# Patient Record
Sex: Male | Born: 1935 | Race: White | Hispanic: No | Marital: Married | Smoking: Former smoker
Health system: Southern US, Community
[De-identification: ages and names within clinical notes are randomized; demographics above are authoritative.]

## PROBLEM LIST (undated history)

## (undated) DIAGNOSIS — C4490 Unspecified malignant neoplasm of skin, unspecified: Secondary | ICD-10-CM

## (undated) DIAGNOSIS — A0472 Enterocolitis due to Clostridium difficile, not specified as recurrent: Secondary | ICD-10-CM

## (undated) DIAGNOSIS — B029 Zoster without complications: Secondary | ICD-10-CM

## (undated) DIAGNOSIS — I2510 Atherosclerotic heart disease of native coronary artery without angina pectoris: Secondary | ICD-10-CM

## (undated) DIAGNOSIS — B1920 Unspecified viral hepatitis C without hepatic coma: Secondary | ICD-10-CM

## (undated) DIAGNOSIS — I77810 Thoracic aortic ectasia: Secondary | ICD-10-CM

## (undated) DIAGNOSIS — I4892 Unspecified atrial flutter: Principal | ICD-10-CM

## (undated) DIAGNOSIS — C220 Liver cell carcinoma: Secondary | ICD-10-CM

## (undated) DIAGNOSIS — K7460 Unspecified cirrhosis of liver: Secondary | ICD-10-CM

## (undated) DIAGNOSIS — R0600 Dyspnea, unspecified: Secondary | ICD-10-CM

## (undated) DIAGNOSIS — N200 Calculus of kidney: Secondary | ICD-10-CM

## (undated) DIAGNOSIS — K219 Gastro-esophageal reflux disease without esophagitis: Secondary | ICD-10-CM

## (undated) DIAGNOSIS — Z87891 Personal history of nicotine dependence: Secondary | ICD-10-CM

## (undated) HISTORY — DX: Calculus of kidney: N20.0

## (undated) HISTORY — DX: Unspecified viral hepatitis C without hepatic coma: B19.20

## (undated) HISTORY — DX: Thoracic aortic ectasia: I77.810

## (undated) HISTORY — DX: Zoster without complications: B02.9

## (undated) HISTORY — DX: Unspecified cirrhosis of liver: K74.60

## (undated) HISTORY — PX: LITHOTRIPSY: SUR834

## (undated) HISTORY — DX: Atherosclerotic heart disease of native coronary artery without angina pectoris: I25.10

## (undated) HISTORY — PX: SKIN CANCER EXCISION: SHX779

## (undated) HISTORY — DX: Unspecified malignant neoplasm of skin, unspecified: C44.90

---

## 1999-05-01 ENCOUNTER — Encounter: Payer: Self-pay | Admitting: Emergency Medicine

## 1999-05-01 ENCOUNTER — Emergency Department (HOSPITAL_COMMUNITY): Admission: EM | Admit: 1999-05-01 | Discharge: 1999-05-01 | Payer: Self-pay | Admitting: *Deleted

## 2001-07-05 ENCOUNTER — Encounter: Admission: RE | Admit: 2001-07-05 | Discharge: 2001-07-05 | Payer: Self-pay | Admitting: Gastroenterology

## 2001-07-05 ENCOUNTER — Encounter: Payer: Self-pay | Admitting: Gastroenterology

## 2002-04-30 ENCOUNTER — Encounter: Payer: Self-pay | Admitting: Urology

## 2002-04-30 ENCOUNTER — Encounter: Admission: RE | Admit: 2002-04-30 | Discharge: 2002-04-30 | Payer: Self-pay | Admitting: Urology

## 2002-05-02 ENCOUNTER — Ambulatory Visit (HOSPITAL_BASED_OUTPATIENT_CLINIC_OR_DEPARTMENT_OTHER): Admission: RE | Admit: 2002-05-02 | Discharge: 2002-05-02 | Payer: Self-pay | Admitting: Urology

## 2002-05-02 ENCOUNTER — Encounter: Payer: Self-pay | Admitting: Urology

## 2004-02-24 ENCOUNTER — Ambulatory Visit (HOSPITAL_COMMUNITY): Admission: RE | Admit: 2004-02-24 | Discharge: 2004-02-24 | Payer: Self-pay | Admitting: Gastroenterology

## 2007-07-05 ENCOUNTER — Ambulatory Visit (HOSPITAL_COMMUNITY): Admission: RE | Admit: 2007-07-05 | Discharge: 2007-07-05 | Payer: Self-pay | Admitting: Urology

## 2007-07-07 ENCOUNTER — Emergency Department (HOSPITAL_COMMUNITY): Admission: EM | Admit: 2007-07-07 | Discharge: 2007-07-07 | Payer: Self-pay | Admitting: Emergency Medicine

## 2010-08-15 HISTORY — PX: ABLATION: SHX5711

## 2010-11-29 ENCOUNTER — Ambulatory Visit: Payer: Self-pay | Admitting: Internal Medicine

## 2010-12-22 ENCOUNTER — Encounter: Payer: Self-pay | Admitting: Internal Medicine

## 2010-12-23 ENCOUNTER — Ambulatory Visit (INDEPENDENT_AMBULATORY_CARE_PROVIDER_SITE_OTHER): Payer: Medicare Other | Admitting: Internal Medicine

## 2010-12-23 ENCOUNTER — Encounter: Payer: Self-pay | Admitting: Internal Medicine

## 2010-12-23 VITALS — BP 140/84 | HR 72 | Resp 18 | Ht 68.0 in | Wt 178.4 lb

## 2010-12-23 DIAGNOSIS — B1920 Unspecified viral hepatitis C without hepatic coma: Secondary | ICD-10-CM

## 2010-12-23 DIAGNOSIS — R002 Palpitations: Secondary | ICD-10-CM

## 2010-12-23 DIAGNOSIS — I499 Cardiac arrhythmia, unspecified: Secondary | ICD-10-CM

## 2010-12-23 NOTE — Assessment & Plan Note (Signed)
Patient is an 77 year old referred for evaluation of atrial fibrillaiton.  Only episode in question occurred duing a procedure.  EKGs after and now show SR with PACs, PVCs. THe patient is asymptomatic. Exam without evid volume overload. EKG with PACs I would recommend that the patient wear a 48 hr holter monitor to document ectopy. I would not do any thing different now. If indeed he has atrial fib wiil need to discuss with GI his risks with aspirin and coumadin.

## 2010-12-23 NOTE — Progress Notes (Signed)
HPI Patient is an 77 year old with a history of hepatitis C.  He has no known cardiac problems He was recently undergoing a bx of a liver lesion and during the procedure was reported to have atrial fibrillation. EKG after the procedure showed SR with PACs and PVCs. The patient denies palpitations.  No CP.  He is very busy.  Farms.  Denies dizziness.  NO SOB.  Allergies  Allergen Reactions  . Penicillins     Current Outpatient Prescriptions  Medication Sig Dispense Refill  . cholestyramine (QUESTRAN) 4 G packet Take 1 packet by mouth daily.        . hydrOXYzine (ATARAX) 25 MG tablet Take 25 mg by mouth 3 (three) times daily as needed.        . Multiple Vitamin (MULTIVITAMIN) capsule Take 1 capsule by mouth. Every other week.      Marland Kitchen DISCONTD: Naproxen Sodium (ALEVE) 220 MG CAPS Take by mouth as needed.          Past Medical History  Diagnosis Date  . Hepatitis C   . Liver cirrhosis   . Shingles   . Skin cancer   . Nephrolithiasis   . Kidney stones     Past Surgical History  Procedure Date  . Lithotripsy     Family History  Problem Relation Age of Onset  . Leukemia Father     History   Social History  . Marital Status: Married    Spouse Name: N/A    Number of Children: N/A  . Years of Education: N/A   Occupational History  . Not on file.   Social History Main Topics  . Smoking status: Former Smoker    Types: Cigarettes    Quit date: 08/15/1970  . Smokeless tobacco: Not on file  . Alcohol Use: Yes  . Drug Use: No  . Sexually Active: Not on file   Other Topics Concern  . Not on file   Social History Narrative  . No narrative on file    Review of Systems:  All systems reviewed.  They are negative to the above problem except as previously stated.  Vital Signs: BP 140/84  Pulse 72  Resp 18  Ht 5\' 8"  (1.727 m)  Wt 178 lb 6.4 oz (80.922 kg)  BMI 27.13 kg/m2  Physical Exam  HEENT:  Normocephalic, atraumatic. EOMI, PERRLA.  Neck: JVP is normal. No  thyromegaly. No bruits.  Lungs: clear to auscultation. No rales no wheezes.  Heart: Regular rate and rhythm. Normal S1, S2. No S3.   No significant murmurs. PMI not displaced.  Abdomen:  Supple, nontender. Normal bowel sounds. No masses. No hepatomegaly.  Extremities:   Good distal pulses throughout. No lower extremity edema.  Musculoskeletal :moving all extremities.  Neuro:   alert and oriented x3.  CN II-XII grossly intact.  EKG:  NSR.  72 bpm.  PACs.   Assessment and Plan:

## 2010-12-23 NOTE — Patient Instructions (Signed)
Your physician has recommended that you wear a holter monitor. Holter monitors are medical devices that record the heart's electrical activity. Doctors most often use these monitors to diagnose arrhythmias. Arrhythmias are problems with the speed or rhythm of the heartbeat. The monitor is a small, portable device. You can wear one while you do your normal daily activities. This is usually used to diagnose what is causing palpitations/syncope (passing out). We will call you with results.

## 2010-12-31 ENCOUNTER — Encounter (INDEPENDENT_AMBULATORY_CARE_PROVIDER_SITE_OTHER): Payer: Medicare Other

## 2010-12-31 DIAGNOSIS — R002 Palpitations: Secondary | ICD-10-CM

## 2010-12-31 NOTE — Op Note (Signed)
NAME:  Lee Allen, Lee Allen NO.:  [id number]   MEDICAL RECORD NO.:  [id number]                   PATIENT TYPE:  AMB   LOCATION:  ENDO                                 FACILITY:  [facility]   PHYSICIAN:  John C. Madilyn Fireman, M.D.                 DATE OF BIRTH:  July 23, 1925   DATE OF PROCEDURE:  02/24/2004  DATE OF DISCHARGE:                                 OPERATIVE REPORT   PROCEDURE:  Colonoscopy.   INDICATIONS FOR PROCEDURE:  Average risk colon cancer screening in a 77-year-  old patient with no prior screening.   DESCRIPTION OF PROCEDURE:  The patient was placed in the left lateral  decubitus position then placed on the pulse monitor with continuous low flow  oxygen delivered by nasal cannula. He was sedated with 75 mcg IV fentanyl  and 7 mg IV Versed. The Olympus video colonoscope was inserted into the  rectum and advanced its entire length but despite multiple position changes,  abdominal pressure and torquing maneuvers I could not reach the cecum  despite the scope being fully inserted. I was able to see the ileocecal  valve from a distance but the proximal ascending colon and cecum were not  adequately inspected.  The mid to distal ascending colon appeared normal  with no masses, polyps or diverticula as did the transverse and descending  colon.  There were a few scattered sigmoid diverticula and no other  abnormalities. The rectum appeared normal and retroflexed view of the anus  revealed no obvious internal hemorrhoids.  The scope was then withdrawn and  the patient returned to the recovery room in stable condition. The patient  tolerated the procedure well and there were no immediate complications.   IMPRESSION:  Diverticulosis otherwise normal study with inadequate view of  the proximal ascending colon, unable to directly visualize the cecum.   PLAN:  Will followup this study with a barium enema.                                               John C.  Madilyn Fireman, M.D.    [facility]/MEDQ  D:  02/24/2004  T:  02/24/2004  Job:  [id number]   cc:   Lianne Bushy, M.D.  [address]  [place]  [place] [address]  Fax: [phone]

## 2011-01-04 ENCOUNTER — Telehealth: Payer: Self-pay | Admitting: *Deleted

## 2011-01-04 NOTE — Telephone Encounter (Signed)
Called patient with results of 48 hour Holter Monitor. Advised will follow up only prn.

## 2011-05-24 LAB — B-NATRIURETIC PEPTIDE (CONVERTED LAB): Pro B Natriuretic peptide (BNP): 41.1

## 2011-05-24 LAB — CBC
HCT: 44.3
Hemoglobin: 15.6
RBC: 4.52

## 2011-05-24 LAB — COMPREHENSIVE METABOLIC PANEL
ALT: 55 — ABNORMAL HIGH
AST: 56 — ABNORMAL HIGH
Albumin: 3.8
CO2: 24
GFR calc Af Amer: 58 — ABNORMAL LOW
Glucose, Bld: 116 — ABNORMAL HIGH
Total Bilirubin: 1.6 — ABNORMAL HIGH
Total Protein: 7

## 2011-05-24 LAB — URINE MICROSCOPIC-ADD ON

## 2011-05-24 LAB — URINALYSIS, ROUTINE W REFLEX MICROSCOPIC
Glucose, UA: NEGATIVE
Ketones, ur: NEGATIVE
Nitrite: NEGATIVE
Protein, ur: NEGATIVE
Specific Gravity, Urine: 1.023
pH: 6

## 2011-05-24 LAB — POCT CARDIAC MARKERS
Operator id: 1211
Troponin i, poc: <0.05

## 2011-05-24 LAB — DIFFERENTIAL
Basophils Relative: 0
Eosinophils Relative: 0
Monocytes Absolute: 0.4
Monocytes Relative: 5
Neutro Abs: 6.8

## 2011-09-01 DIAGNOSIS — Z8505 Personal history of malignant neoplasm of liver: Secondary | ICD-10-CM | POA: Diagnosis not present

## 2011-09-01 DIAGNOSIS — I868 Varicose veins of other specified sites: Secondary | ICD-10-CM | POA: Diagnosis not present

## 2011-09-01 DIAGNOSIS — C228 Malignant neoplasm of liver, primary, unspecified as to type: Secondary | ICD-10-CM | POA: Diagnosis not present

## 2011-09-01 DIAGNOSIS — K7460 Unspecified cirrhosis of liver: Secondary | ICD-10-CM | POA: Diagnosis not present

## 2011-09-01 DIAGNOSIS — K862 Cyst of pancreas: Secondary | ICD-10-CM | POA: Diagnosis not present

## 2011-09-01 DIAGNOSIS — K766 Portal hypertension: Secondary | ICD-10-CM | POA: Diagnosis not present

## 2011-09-01 DIAGNOSIS — R161 Splenomegaly, not elsewhere classified: Secondary | ICD-10-CM | POA: Diagnosis not present

## 2011-09-05 DIAGNOSIS — I491 Atrial premature depolarization: Secondary | ICD-10-CM | POA: Diagnosis not present

## 2011-09-05 DIAGNOSIS — G458 Other transient cerebral ischemic attacks and related syndromes: Secondary | ICD-10-CM | POA: Diagnosis not present

## 2011-09-05 DIAGNOSIS — B182 Chronic viral hepatitis C: Secondary | ICD-10-CM | POA: Diagnosis not present

## 2011-09-06 DIAGNOSIS — G459 Transient cerebral ischemic attack, unspecified: Secondary | ICD-10-CM | POA: Diagnosis not present

## 2011-09-06 DIAGNOSIS — N400 Enlarged prostate without lower urinary tract symptoms: Secondary | ICD-10-CM | POA: Diagnosis not present

## 2011-09-06 DIAGNOSIS — Z23 Encounter for immunization: Secondary | ICD-10-CM | POA: Diagnosis not present

## 2011-09-06 DIAGNOSIS — E785 Hyperlipidemia, unspecified: Secondary | ICD-10-CM | POA: Diagnosis not present

## 2011-09-06 DIAGNOSIS — Z0000 Encounter for general adult medical examination without abnormal findings: Secondary | ICD-10-CM | POA: Diagnosis not present

## 2011-09-08 DIAGNOSIS — Z0000 Encounter for general adult medical examination without abnormal findings: Secondary | ICD-10-CM | POA: Diagnosis not present

## 2011-11-04 DIAGNOSIS — D235 Other benign neoplasm of skin of trunk: Secondary | ICD-10-CM | POA: Diagnosis not present

## 2011-11-04 DIAGNOSIS — B0229 Other postherpetic nervous system involvement: Secondary | ICD-10-CM | POA: Diagnosis not present

## 2011-11-04 DIAGNOSIS — Z85828 Personal history of other malignant neoplasm of skin: Secondary | ICD-10-CM | POA: Diagnosis not present

## 2011-12-13 DIAGNOSIS — B182 Chronic viral hepatitis C: Secondary | ICD-10-CM | POA: Diagnosis not present

## 2011-12-13 DIAGNOSIS — N400 Enlarged prostate without lower urinary tract symptoms: Secondary | ICD-10-CM | POA: Diagnosis not present

## 2011-12-13 DIAGNOSIS — E785 Hyperlipidemia, unspecified: Secondary | ICD-10-CM | POA: Diagnosis not present

## 2011-12-13 DIAGNOSIS — I491 Atrial premature depolarization: Secondary | ICD-10-CM | POA: Diagnosis not present

## 2011-12-21 DIAGNOSIS — L299 Pruritus, unspecified: Secondary | ICD-10-CM | POA: Diagnosis not present

## 2011-12-21 DIAGNOSIS — Z85828 Personal history of other malignant neoplasm of skin: Secondary | ICD-10-CM | POA: Diagnosis not present

## 2011-12-21 DIAGNOSIS — D235 Other benign neoplasm of skin of trunk: Secondary | ICD-10-CM | POA: Diagnosis not present

## 2012-01-03 DIAGNOSIS — H5200 Hypermetropia, unspecified eye: Secondary | ICD-10-CM | POA: Diagnosis not present

## 2012-01-03 DIAGNOSIS — H2589 Other age-related cataract: Secondary | ICD-10-CM | POA: Diagnosis not present

## 2012-04-26 DIAGNOSIS — L299 Pruritus, unspecified: Secondary | ICD-10-CM | POA: Diagnosis not present

## 2012-04-26 DIAGNOSIS — R5383 Other fatigue: Secondary | ICD-10-CM | POA: Diagnosis not present

## 2012-04-26 DIAGNOSIS — R5381 Other malaise: Secondary | ICD-10-CM | POA: Diagnosis not present

## 2012-04-26 DIAGNOSIS — E539 Vitamin B deficiency, unspecified: Secondary | ICD-10-CM | POA: Diagnosis not present

## 2012-04-26 DIAGNOSIS — Z1379 Encounter for other screening for genetic and chromosomal anomalies: Secondary | ICD-10-CM | POA: Diagnosis not present

## 2012-04-26 DIAGNOSIS — N390 Urinary tract infection, site not specified: Secondary | ICD-10-CM | POA: Diagnosis not present

## 2012-04-26 DIAGNOSIS — Z136 Encounter for screening for cardiovascular disorders: Secondary | ICD-10-CM | POA: Diagnosis not present

## 2012-04-26 DIAGNOSIS — Z13220 Encounter for screening for lipoid disorders: Secondary | ICD-10-CM | POA: Diagnosis not present

## 2012-05-18 DIAGNOSIS — M109 Gout, unspecified: Secondary | ICD-10-CM | POA: Diagnosis not present

## 2012-05-18 DIAGNOSIS — K219 Gastro-esophageal reflux disease without esophagitis: Secondary | ICD-10-CM | POA: Diagnosis not present

## 2012-07-18 DIAGNOSIS — C44319 Basal cell carcinoma of skin of other parts of face: Secondary | ICD-10-CM | POA: Diagnosis not present

## 2012-08-30 DIAGNOSIS — Z8619 Personal history of other infectious and parasitic diseases: Secondary | ICD-10-CM | POA: Diagnosis not present

## 2012-08-30 DIAGNOSIS — K862 Cyst of pancreas: Secondary | ICD-10-CM | POA: Diagnosis not present

## 2012-08-30 DIAGNOSIS — K863 Pseudocyst of pancreas: Secondary | ICD-10-CM | POA: Diagnosis not present

## 2012-08-30 DIAGNOSIS — K7460 Unspecified cirrhosis of liver: Secondary | ICD-10-CM | POA: Diagnosis not present

## 2012-08-30 DIAGNOSIS — Z8505 Personal history of malignant neoplasm of liver: Secondary | ICD-10-CM | POA: Diagnosis not present

## 2012-08-30 DIAGNOSIS — C228 Malignant neoplasm of liver, primary, unspecified as to type: Secondary | ICD-10-CM | POA: Diagnosis not present

## 2012-08-30 DIAGNOSIS — Z09 Encounter for follow-up examination after completed treatment for conditions other than malignant neoplasm: Secondary | ICD-10-CM | POA: Diagnosis not present

## 2012-08-30 DIAGNOSIS — R109 Unspecified abdominal pain: Secondary | ICD-10-CM | POA: Diagnosis not present

## 2012-08-30 DIAGNOSIS — K766 Portal hypertension: Secondary | ICD-10-CM | POA: Diagnosis not present

## 2012-09-24 DIAGNOSIS — Z923 Personal history of irradiation: Secondary | ICD-10-CM | POA: Diagnosis not present

## 2012-09-24 DIAGNOSIS — C228 Malignant neoplasm of liver, primary, unspecified as to type: Secondary | ICD-10-CM | POA: Diagnosis not present

## 2012-09-24 DIAGNOSIS — B1710 Acute hepatitis C without hepatic coma: Secondary | ICD-10-CM | POA: Diagnosis not present

## 2012-09-24 DIAGNOSIS — B1920 Unspecified viral hepatitis C without hepatic coma: Secondary | ICD-10-CM | POA: Diagnosis not present

## 2012-10-03 ENCOUNTER — Encounter (HOSPITAL_COMMUNITY): Payer: Self-pay | Admitting: *Deleted

## 2012-10-03 ENCOUNTER — Inpatient Hospital Stay (HOSPITAL_COMMUNITY)
Admission: EM | Admit: 2012-10-03 | Discharge: 2012-10-05 | DRG: 309 | Disposition: A | Discharge status: Home / Self Care | Payer: Medicare Other | Attending: Cardiology | Admitting: Cardiology

## 2012-10-03 ENCOUNTER — Emergency Department (HOSPITAL_COMMUNITY): Payer: Medicare Other

## 2012-10-03 DIAGNOSIS — Z87891 Personal history of nicotine dependence: Secondary | ICD-10-CM | POA: Diagnosis not present

## 2012-10-03 DIAGNOSIS — I4892 Unspecified atrial flutter: Principal | ICD-10-CM | POA: Diagnosis present

## 2012-10-03 DIAGNOSIS — Z880 Allergy status to penicillin: Secondary | ICD-10-CM | POA: Diagnosis not present

## 2012-10-03 DIAGNOSIS — B182 Chronic viral hepatitis C: Secondary | ICD-10-CM | POA: Diagnosis not present

## 2012-10-03 DIAGNOSIS — I4891 Unspecified atrial fibrillation: Secondary | ICD-10-CM | POA: Diagnosis present

## 2012-10-03 DIAGNOSIS — Z806 Family history of leukemia: Secondary | ICD-10-CM

## 2012-10-03 DIAGNOSIS — D696 Thrombocytopenia, unspecified: Secondary | ICD-10-CM | POA: Diagnosis present

## 2012-10-03 DIAGNOSIS — K766 Portal hypertension: Secondary | ICD-10-CM | POA: Diagnosis present

## 2012-10-03 DIAGNOSIS — K7460 Unspecified cirrhosis of liver: Secondary | ICD-10-CM | POA: Diagnosis present

## 2012-10-03 DIAGNOSIS — C228 Malignant neoplasm of liver, primary, unspecified as to type: Secondary | ICD-10-CM | POA: Diagnosis not present

## 2012-10-03 DIAGNOSIS — I498 Other specified cardiac arrhythmias: Secondary | ICD-10-CM | POA: Diagnosis not present

## 2012-10-03 DIAGNOSIS — Z7901 Long term (current) use of anticoagulants: Secondary | ICD-10-CM | POA: Diagnosis not present

## 2012-10-03 DIAGNOSIS — C50919 Malignant neoplasm of unspecified site of unspecified female breast: Secondary | ICD-10-CM | POA: Diagnosis present

## 2012-10-03 DIAGNOSIS — R000 Tachycardia, unspecified: Secondary | ICD-10-CM | POA: Diagnosis not present

## 2012-10-03 DIAGNOSIS — J438 Other emphysema: Secondary | ICD-10-CM | POA: Diagnosis not present

## 2012-10-03 DIAGNOSIS — Z79899 Other long term (current) drug therapy: Secondary | ICD-10-CM

## 2012-10-03 DIAGNOSIS — R222 Localized swelling, mass and lump, trunk: Secondary | ICD-10-CM | POA: Diagnosis not present

## 2012-10-03 DIAGNOSIS — B1920 Unspecified viral hepatitis C without hepatic coma: Secondary | ICD-10-CM | POA: Diagnosis present

## 2012-10-03 DIAGNOSIS — K219 Gastro-esophageal reflux disease without esophagitis: Secondary | ICD-10-CM | POA: Diagnosis present

## 2012-10-03 DIAGNOSIS — R937 Abnormal findings on diagnostic imaging of other parts of musculoskeletal system: Secondary | ICD-10-CM | POA: Diagnosis not present

## 2012-10-03 DIAGNOSIS — Z85828 Personal history of other malignant neoplasm of skin: Secondary | ICD-10-CM | POA: Diagnosis not present

## 2012-10-03 DIAGNOSIS — C229 Malignant neoplasm of liver, not specified as primary or secondary: Secondary | ICD-10-CM | POA: Diagnosis not present

## 2012-10-03 HISTORY — DX: Gastro-esophageal reflux disease without esophagitis: K21.9

## 2012-10-03 HISTORY — DX: Unspecified atrial flutter: I48.92

## 2012-10-03 HISTORY — DX: Personal history of nicotine dependence: Z87.891

## 2012-10-03 HISTORY — DX: Liver cell carcinoma: C22.0

## 2012-10-03 LAB — CBC WITH DIFFERENTIAL/PLATELET
Basophils Relative: 0 % (ref 0–1)
HCT: 42.7 % (ref 39.0–52.0)
Lymphocytes Relative: 39 % (ref 12–46)
Lymphs Abs: 2.1 10*3/uL (ref 0.7–4.0)
MCH: 34.7 pg — ABNORMAL HIGH (ref 26.0–34.0)
MCHC: 36.5 g/dL — ABNORMAL HIGH (ref 30.0–36.0)
Monocytes Relative: 9 % (ref 3–12)
Neutro Abs: 2.5 10*3/uL (ref 1.7–7.7)
WBC: 5.3 10*3/uL (ref 4.0–10.5)

## 2012-10-03 LAB — COMPREHENSIVE METABOLIC PANEL
ALT: 129 U/L — ABNORMAL HIGH (ref 0–53)
AST: 132 U/L — ABNORMAL HIGH (ref 0–37)
Albumin: 3.4 g/dL — ABNORMAL LOW (ref 3.5–5.2)
Alkaline Phosphatase: 111 U/L (ref 39–117)
BUN: 19 mg/dL (ref 6–23)
Potassium: 3.7 mEq/L (ref 3.5–5.1)
Sodium: 140 mEq/L (ref 135–145)

## 2012-10-03 LAB — TROPONIN I: Troponin I: <0.3 ng/mL (ref <0.30)

## 2012-10-03 MED ORDER — METOPROLOL TARTRATE 1 MG/ML IV SOLN
2.5000 mg | Freq: Once | INTRAVENOUS | Status: AC
  Administered 2012-10-03: 2.5 mg via INTRAVENOUS
  Filled 2012-10-03: qty 5

## 2012-10-03 MED ORDER — SODIUM CHLORIDE 0.9 % IV BOLUS (SEPSIS)
500.0000 mL | Freq: Once | INTRAVENOUS | Status: AC
  Administered 2012-10-03: 500 mL via INTRAVENOUS

## 2012-10-03 MED ORDER — IOHEXOL 350 MG/ML SOLN
80.0000 mL | Freq: Once | INTRAVENOUS | Status: AC | PRN
  Administered 2012-10-03: 80 mL via INTRAVENOUS

## 2012-10-03 NOTE — ED Provider Notes (Signed)
History     CSN: [id number]  Arrival date & time 10/03/12  1748   First MD Initiated Contact with Patient 10/03/12 1832      Chief Complaint  Patient presents with  . Tachycardia    (Consider location/radiation/quality/duration/timing/severity/associated sxs/prior treatment) The history is provided by the patient.  patient presents with tachycardia. He was at [facility] cancer center for monitoring of his liver cancer. He was found to be tachycardic. No chest pain. No difficulty breathing. No cough. No swelling in his legs. He has had a previous history of a "arrhythmia". Review shows it to be a one-time episode of atrial fibrillation. He saw Dr. Tenny Craw for it.  Past Medical History  Diagnosis Date  . Hepatitis C   . Liver cirrhosis   . Shingles   . Nephrolithiasis   . Kidney stones   . Skin cancer     Past Surgical History  Procedure Laterality Date  . Lithotripsy      Family History  Problem Relation Age of Onset  . Leukemia Father     History  Substance Use Topics  . Smoking status: Former Smoker    Types: Cigarettes    Quit date: 08/15/1970  . Smokeless tobacco: Not on file  . Alcohol Use: 1.8 oz/week    3 Cans of beer per week     Comment: occasionally      Review of Systems  Constitutional: Negative for activity change and appetite change.  HENT: Negative for neck stiffness.   Eyes: Negative for pain.  Respiratory: Negative for chest tightness and shortness of breath.   Cardiovascular: Negative for chest pain and leg swelling.  Gastrointestinal: Negative for nausea, vomiting, abdominal pain and diarrhea.  Genitourinary: Negative for flank pain.  Musculoskeletal: Negative for back pain.  Skin: Negative for rash.  Neurological: Negative for weakness, numbness and headaches.  Psychiatric/Behavioral: Negative for behavioral problems.    Allergies  Penicillins  Home Medications  No current outpatient prescriptions on file.  BP 127/80  Pulse 132   Temp(Src) 97.9 F (36.6 C) (Oral)  Resp 15  Ht 5\' 8"  (1.727 m)  Wt 180 lb (81.647 kg)  BMI 27.38 kg/m2  SpO2 98%  Physical Exam  Nursing note and vitals reviewed. Constitutional: He is oriented to person, place, and time. He appears well-developed and well-nourished.  HENT:  Head: Normocephalic and atraumatic.  Eyes: EOM are normal. Pupils are equal, round, and reactive to light.  Neck: Normal range of motion. Neck supple.  Cardiovascular: Regular rhythm and normal heart sounds.   No murmur heard. Tachycardia.   Pulmonary/Chest: Effort normal and breath sounds normal.  Abdominal: Soft. Bowel sounds are normal. He exhibits no distension and no mass. There is no tenderness. There is no rebound and no guarding.  Musculoskeletal: Normal range of motion. He exhibits no edema.  Neurological: He is alert and oriented to person, place, and time. No cranial nerve deficit.  Skin: Skin is warm and dry.  Psychiatric: He has a normal mood and affect.    ED Course  Procedures (including critical care time)  Labs Reviewed  CBC WITH DIFFERENTIAL - Abnormal; Notable for the following:    MCH 34.7 (*)    MCHC 36.5 (*)    All other components within normal limits  COMPREHENSIVE METABOLIC PANEL - Abnormal; Notable for the following:    Albumin 3.4 (*)    AST 132 (*)    ALT 129 (*)    GFR calc non Af Amer 75 (*)  GFR calc Af Amer 87 (*)    All other components within normal limits  TROPONIN I   No results found.   1. Tachycardia      Date: 10/03/2012  Rate: 134  Rhythm: sinus tachycardia  QRS Axis: left  Intervals: normal  ST/T Wave abnormalities: normal  Conduction Disutrbances:none  Narrative Interpretation:tachycardia is new   Old EKG Reviewed: changes noted    MDM  Patient presents with an asymptomatic tachycardia. Heart rate of approximately 130, but he was sinus. Previous episode once of atrial fibrillation. Lab work is stable. CT angiography is pending at this time  due to his cancer history. [facility] may need to be contacted no clear cause is found.        Juliet Rude. Rubin Payor, MD 10/03/12 2024

## 2012-10-03 NOTE — ED Notes (Signed)
PT sent here from cancer center in [facility] for HR of 130 while at rest.  Pt denies sob, pain or dizziness.  States he may be uptight.

## 2012-10-03 NOTE — ED Provider Notes (Signed)
Pt w hx of  Hep C, CA (hepatic?), previous hepatic ablation w procedural A fib followed by Dr. Tenny Craw [facility]) presented to ER w tachycardia. HR on arrival was 130 sinus tachy. Plan per previous provider is  pending CT angio r/o PE. Call cards after imaging to discuss possible BB Rx if no PE. Pt re-evaluated & on exam:  NAD, heart w/ tachycardia but CP free, lungs CTAB, Chest & abd non-tender, no peripheral edema or calf tenderness. BP 129/99  Pulse 131  Temp(Src) 97.9 F (36.6 C) (Oral)  Resp 17  Ht 5\' 8"  (1.727 m)  Wt 180 lb (81.647 kg)  BMI 27.38 kg/m2  SpO2 98%   9:59 PM  CT ANGIOGRAPHY CHEST Technique: Multidetector CT imaging of the chest using the standard protocol during bolus administration of intravenous contrast. Multiplanar reconstructed images including MIPs were obtained and reviewed to evaluate the vascular anatomy. Contrast: 80mL OMNIPAQUE IOHEXOL 350 MG/ML SOLN Comparison: 06/26/2007 abdominal CT Findings: Pulmonary arterial vessels are patent. Cardiomegaly. Coronary artery calcification. Dilatation of the ascending aorta up to 4 cm, tapers along the arch. Descending thoracic aorta measures 3.7 cm. Aorta at the hiatus measures 3.3 cm. No intrathoracic lymphadenopathy. There are calcified right hilar lymph nodes. Limited images through the upper abdomen show lobular hepatic contour and hepatic granuloma. Questionable enhancing lesion within segment 7/80 as seen on image 79. Bilateral incompletely characterized renal hypodensities. Splenomegaly. Cystic pancreatic lesions measuring 1.3 cm and 1.0 cm. Heterogeneous opacity within the proximal abdominal aorta is nonspecific and may reflect mixing artifact. Diffuse bilateral ground-glass opacity with mild underlying emphysematous changes. No pneumothorax. The central airways are patent. Detailed parenchymal evaluation is degraded by respiratory motion. Multilevel degenerative changes of the imaged spine. No acute  or aggressive appearing osseous lesion.  IMPRESSION: No pulmonary embolism identified. Cardiomegaly with coronary artery calcification. Dilation of the ascending aorta up to 4 cm. Bilateral areas of ground-glass opacity may reflect atelectasis, atypical infection, or edema. Mild background emphysema and areas of air trapping. Cirrhotic liver morphology with portal hypertension as suggested by splenomegaly. Questionable 1.5 cm lesion within segment 7/8. Cystic foci along the pancreas may reflect cystic neoplasms or sequelae of prior pancreatitis / pseudocysts. Correlate with outside prior dedicated abdominal imaging.  [facility] is monitoring pts liver CA. Found to be tachycardic and send to ER for further evaluation. Discussed liver & pancreatic lesions with pts who verbalizes understanding for neoplam concern & states that it is already being evaluated.  No PE on CT angio  Consult Cardiology, Dr. Vanessa Barbara- to see pt in ER. Metoprolol 2.5 IV ordered per MD request. Pt on monitor pending cardiologist consult. On re-exam pt still asymptomatic denying CP, DOB or dizziness. Tachy at 132 w normal rhythm. BP stable 128/83  Adenosine used to try and convert pt. 6mg  pushed w no effect followed by 12mg . Atrial pacing seen. Pt to be admitted to cardiology for further observation and cardioversion. Adenosine given with Dr. Fonnie Jarvis who is agreeable with treatment plan. 5mg  dilt ordered & Dr. Adolm Joseph to admit pt.   CRITICAL CARE Performed by: Jaci Carrel   Total critical care time: 30  Critical care time was exclusive of separately billable procedures and treating other patients.  Critical care was necessary to treat or prevent imminent or life-threatening deterioration.  Critical care was time spent personally by me on the following activities: development of treatment plan with patient and/or surrogate as well as nursing, discussions with consultants, evaluation of patient's  response  to treatment, examination of patient, obtaining history from patient or surrogate, ordering and performing treatments and interventions, ordering and review of laboratory studies, ordering and review of radiographic studies, pulse oximetry and re-evaluation of patient's condition.  Tachycardia with atrial pacing        [place], PA-C 10/04/12 [phone]

## 2012-10-04 ENCOUNTER — Observation Stay (HOSPITAL_COMMUNITY): Payer: Medicare Other | Admitting: Certified Registered"

## 2012-10-04 ENCOUNTER — Encounter (HOSPITAL_COMMUNITY)
Admission: EM | Disposition: A | Discharge status: Home / Self Care | Payer: Self-pay | Source: Home / Self Care | Attending: Cardiology

## 2012-10-04 ENCOUNTER — Encounter (HOSPITAL_COMMUNITY): Payer: Self-pay | Admitting: Certified Registered"

## 2012-10-04 ENCOUNTER — Encounter (HOSPITAL_COMMUNITY): Payer: Self-pay | Admitting: General Practice

## 2012-10-04 DIAGNOSIS — I4892 Unspecified atrial flutter: Secondary | ICD-10-CM

## 2012-10-04 DIAGNOSIS — C228 Malignant neoplasm of liver, primary, unspecified as to type: Secondary | ICD-10-CM | POA: Diagnosis not present

## 2012-10-04 HISTORY — PX: CARDIOVERSION: SHX1299

## 2012-10-04 HISTORY — PX: TEE WITHOUT CARDIOVERSION: SHX5443

## 2012-10-04 LAB — HEPARIN LEVEL (UNFRACTIONATED)
Heparin Unfractionated: 0.24 IU/mL — ABNORMAL LOW (ref 0.30–0.70)
Heparin Unfractionated: 0.36 IU/mL (ref 0.30–0.70)

## 2012-10-04 LAB — CBC
MCHC: 35.6 g/dL (ref 30.0–36.0)
MCHC: 35.3 g/dL (ref 30.0–36.0)
RDW: 13.4 % (ref 11.5–15.5)
RDW: 13.3 % (ref 11.5–15.5)

## 2012-10-04 LAB — BASIC METABOLIC PANEL
BUN: 18 mg/dL (ref 6–23)
Calcium: 8.7 mg/dL (ref 8.4–10.5)
Creatinine, Ser: 0.95 mg/dL (ref 0.50–1.35)
GFR calc Af Amer: 85 mL/min — ABNORMAL LOW (ref >90)
GFR calc non Af Amer: 73 mL/min — ABNORMAL LOW (ref >90)

## 2012-10-04 LAB — PROTIME-INR: Prothrombin Time: 14.2 seconds (ref 11.6–15.2)

## 2012-10-04 LAB — TSH: TSH: 4.506 u[IU]/mL — ABNORMAL HIGH (ref 0.350–4.500)

## 2012-10-04 SURGERY — ECHOCARDIOGRAM, TRANSESOPHAGEAL
Anesthesia: Moderate Sedation

## 2012-10-04 MED ORDER — ADENOSINE 6 MG/2ML IV SOLN
12.0000 mg | Freq: Once | INTRAVENOUS | Status: AC
  Administered 2012-10-04: 12 mg via INTRAVENOUS
  Filled 2012-10-04: qty 6

## 2012-10-04 MED ORDER — SODIUM CHLORIDE 0.9 % IV SOLN: 250.0000 mL | INTRAVENOUS | Status: DC

## 2012-10-04 MED ORDER — SODIUM CHLORIDE 0.9 % IJ SOLN: 3.0000 mL | Freq: Two times a day (BID) | INTRAMUSCULAR | Status: DC

## 2012-10-04 MED ORDER — HEPARIN BOLUS VIA INFUSION
4000.0000 [IU] | Freq: Once | INTRAVENOUS | Status: AC
  Administered 2012-10-04: 4000 [IU] via INTRAVENOUS
  Filled 2012-10-04: qty 4000

## 2012-10-04 MED ORDER — SODIUM CHLORIDE 0.9 % IJ SOLN
3.0000 mL | Freq: Two times a day (BID) | INTRAMUSCULAR | Status: DC
  Administered 2012-10-04: 3 mL via INTRAVENOUS

## 2012-10-04 MED ORDER — DILTIAZEM HCL 50 MG/10ML IV SOLN: 10.0000 mg | Freq: Once | INTRAVENOUS | Status: DC

## 2012-10-04 MED ORDER — PROPOFOL 10 MG/ML IV BOLUS
INTRAVENOUS | Status: DC | PRN
  Administered 2012-10-04: 80 mg via INTRAVENOUS

## 2012-10-04 MED ORDER — FENTANYL CITRATE 0.05 MG/ML IJ SOLN
INTRAMUSCULAR | Status: AC
  Filled 2012-10-04: qty 4

## 2012-10-04 MED ORDER — MIDAZOLAM HCL 10 MG/2ML IJ SOLN
INTRAMUSCULAR | Status: DC | PRN
  Administered 2012-10-04 (×2): 2 mg via INTRAVENOUS

## 2012-10-04 MED ORDER — MIDAZOLAM HCL 5 MG/ML IJ SOLN
INTRAMUSCULAR | Status: AC
  Filled 2012-10-04: qty 2

## 2012-10-04 MED ORDER — DILTIAZEM HCL 50 MG/10ML IV SOLN
5.0000 mg | Freq: Once | INTRAVENOUS | Status: AC
  Administered 2012-10-04: 01:00:00 via INTRAVENOUS
  Filled 2012-10-04: qty 1

## 2012-10-04 MED ORDER — DILTIAZEM HCL 100 MG IV SOLR
5.0000 mg/h | INTRAVENOUS | Status: DC
  Administered 2012-10-04: 5 mg/h via INTRAVENOUS
  Filled 2012-10-04 (×2): qty 100

## 2012-10-04 MED ORDER — DIPHENHYDRAMINE HCL 50 MG/ML IJ SOLN
INTRAMUSCULAR | Status: AC
  Filled 2012-10-04: qty 1

## 2012-10-04 MED ORDER — HEPARIN (PORCINE) IN NACL 100-0.45 UNIT/ML-% IJ SOLN
1400.0000 [IU]/h | INTRAMUSCULAR | Status: DC
  Administered 2012-10-04: 1100 [IU]/h via INTRAVENOUS
  Administered 2012-10-04: 1300 [IU]/h via INTRAVENOUS
  Filled 2012-10-04 (×3): qty 250

## 2012-10-04 MED ORDER — WARFARIN - PHARMACIST DOSING INPATIENT: Freq: Every day | Status: DC

## 2012-10-04 MED ORDER — FENTANYL CITRATE 0.05 MG/ML IJ SOLN
INTRAMUSCULAR | Status: DC | PRN
  Administered 2012-10-04 (×2): 25 ug via INTRAVENOUS

## 2012-10-04 MED ORDER — HEPARIN BOLUS VIA INFUSION
1000.0000 [IU] | Freq: Once | INTRAVENOUS | Status: AC
  Administered 2012-10-04: 1000 [IU] via INTRAVENOUS
  Filled 2012-10-04: qty 1000

## 2012-10-04 MED ORDER — SODIUM CHLORIDE 0.9 % IJ SOLN: 3.0000 mL | INTRAMUSCULAR | Status: DC | PRN

## 2012-10-04 MED ORDER — WARFARIN SODIUM 7.5 MG PO TABS
7.5000 mg | ORAL_TABLET | Freq: Once | ORAL | Status: AC
  Administered 2012-10-04: 7.5 mg via ORAL
  Filled 2012-10-04: qty 1

## 2012-10-04 MED ORDER — BUTAMBEN-TETRACAINE-BENZOCAINE 2-2-14 % EX AERO
INHALATION_SPRAY | CUTANEOUS | Status: DC | PRN
  Administered 2012-10-04: 2 via TOPICAL

## 2012-10-04 NOTE — Progress Notes (Signed)
ANTICOAGULATION CONSULT NOTE - Initial Consult  Pharmacy Consult for heparin Indication: atrial fibrillation  Allergies  Allergen Reactions  . Penicillins     Patient Measurements: Height: 5\' 8"  (172.7 cm) Weight: 180 lb (81.647 kg) IBW/kg (Calculated) : 68.4 Heparin Dosing Weight: 80kg  Vital Signs: Temp: 97.9 F (36.6 C) (02/19 1817) Temp src: Oral (02/19 1817) BP: 104/74 mmHg (02/20 0300) Pulse Rate: 70 (02/20 0300)  Labs:  Recent Labs  10/03/12 1851 10/04/12 0041  HGB 15.6  --   HCT 42.7  --   PLT 101*  --   LABPROT  --  14.2  INR  --  1.11  CREATININE 0.90  --   TROPONINI <0.30  --     Estimated Creatinine Clearance: 57 ml/min (by C-G formula based on Cr of 0.9).   Medical History: Past Medical History  Diagnosis Date  . Hepatitis C   . Liver cirrhosis   . Shingles   . Nephrolithiasis   . Kidney stones   . Skin cancer     Medications:  Scheduled:  . [COMPLETED] adenosine (ADENOCARD) IV  12 mg Intravenous Once  . [COMPLETED] diltiazem  5 mg Intravenous Once  . [COMPLETED] metoprolol  2.5 mg Intravenous Once  . [COMPLETED] sodium chloride  500 mL Intravenous Once  . sodium chloride  3 mL Intravenous Q12H  . [DISCONTINUED] diltiazem  10 mg Intravenous Once   Infusions:  . diltiazem (CARDIZEM) infusion 5 mg/hr (10/04/12 0124)    Assessment: 79yo male had resting HR of 130 at cancer center, had one-time episode of Afib in past, now in persistent Afib with failed adenosine conversion, to begin heparin.  Goal of Therapy:  Heparin level 0.3-0.7 units/ml Monitor platelets by anticoagulation protocol: Yes   Plan:  Will give heparin 4000 units IV bolus x1 followed by gtt at 1100 units/hr and monitor heparin levels and CBC.  Colleen Can PharmD BCPS 10/04/2012,3:51 AM

## 2012-10-04 NOTE — CV Procedure (Signed)
TEE procedure.  Sedation: 50 micrograms of IV Fentanyl and 4 mg IV Versed.  Findings: Normal LV function. EF 55-60% Mild mitral annular calcification. Mild to moderate MR. Aortic valve leaflets thickened with mild calcification. No aortic stenosis or insufficiency. Tricuspid and pulmonic valves are normal. Normal LA/LAA without thrombus.  Plan: proceed with DCCV.   Full TEE report to follow.  Peter Swaziland MD, [facility] 10/04/2012 3:49 PM

## 2012-10-04 NOTE — H&P (View-Only) (Signed)
Patient ID: Lee Allen, male   DOB: 02/09/1925, 79 y.o.   MRN: [id number]    SUBJECTIVE: Patient remains in atrial fibrillation this morning, HR around 130.  He is not sure when the atrial fibrillation began.  No dyspnea/chest pain.   Current Facility-Administered Medications  Medication Dose Route Frequency Provider Last Rate Last Dose  . diltiazem (CARDIZEM) 100 mg in dextrose 5 % 100 mL infusion  5 mg/hr Intravenous Titrated Matthew Whitlock, MD 5 mL/hr at 10/04/12 0124 5 mg/hr at 10/04/12 0124  . heparin ADULT infusion 100 units/mL (25000 units/250 mL)  1,100 Units/hr Intravenous Continuous Veronda Pauline Bryk, PHARMD 11 mL/hr at 10/04/12 0626 1,100 Units/hr at 10/04/12 0626  . sodium chloride 0.9 % injection 3 mL  3 mL Intravenous Q12H Matthew Whitlock, MD      diltiazem gtt Heparin gtt    Filed Vitals:   10/04/12 0200 10/04/12 0230 10/04/12 0300 10/04/12 0350  BP: 110/82 98/79 104/74 123/79  Pulse: 130 132 70 130  Temp:    97.2 F (36.2 C)  TempSrc:    Oral  Resp:      Height:    5' 8" (1.727 m)  Weight:    180 lb (81.647 kg)  SpO2: 98% 96% 96% 98%   No intake or output data in the 24 hours ending 10/04/12 0816  LABS: Basic Metabolic Panel:  Recent Labs  10/03/12 1851 10/04/12 0511  NA 140 141  K 3.7 3.8  CL 105 107  CO2 23 23  GLUCOSE 81 129*  BUN 19 18  CREATININE 0.90 0.95  CALCIUM 9.5 8.7   Liver Function Tests:  Recent Labs  10/03/12 1851  AST 132*  ALT 129*  ALKPHOS 111  BILITOT 1.0  PROT 7.6  ALBUMIN 3.4*   No results found for this basename: LIPASE, AMYLASE,  in the last 72 hours CBC:  Recent Labs  10/03/12 1851 10/04/12 0511  WBC 5.3 4.2  NEUTROABS 2.5  --   HGB 15.6 14.7  HCT 42.7 41.3  MCV 94.9 95.8  PLT 101* 95*   Cardiac Enzymes:  Recent Labs  10/03/12 1851  TROPONINI <0.30   BNP: No components found with this basename: POCBNP,  D-Dimer: No results found for this basename: DDIMER,  in the last 72 hours Hemoglobin  A1C: No results found for this basename: HGBA1C,  in the last 72 hours Fasting Lipid Panel: No results found for this basename: CHOL, HDL, LDLCALC, TRIG, CHOLHDL, LDLDIRECT,  in the last 72 hours Thyroid Function Tests: No results found for this basename: TSH, T4TOTAL, FREET3, T3FREE, THYROIDAB,  in the last 72 hours Anemia Panel: No results found for this basename: VITAMINB12, FOLATE, FERRITIN, TIBC, IRON, RETICCTPCT,  in the last 72 hours  RADIOLOGY: Ct Angio Chest W/cm &/or Wo Cm  10/03/2012  *RADIOLOGY REPORT*  Clinical Data: Tachycardia, liver cancer  CT ANGIOGRAPHY CHEST  Technique:  Multidetector CT imaging of the chest using the standard protocol during bolus administration of intravenous contrast. Multiplanar reconstructed images including MIPs were obtained and reviewed to evaluate the vascular anatomy.  Contrast: 80mL OMNIPAQUE IOHEXOL 350 MG/ML SOLN  Comparison: 06/26/2007 abdominal CT  Findings: Pulmonary arterial vessels are patent.  Cardiomegaly. Coronary artery calcification.  Dilatation of the ascending aorta up to 4 cm, tapers along the arch.  Descending thoracic aorta measures 3.7 cm.  Aorta at the hiatus measures 3.3 cm.  No intrathoracic lymphadenopathy.  There are calcified right hilar lymph nodes.  Limited images through the upper   abdomen show lobular hepatic contour and hepatic granuloma. Questionable enhancing lesion within segment 7/80 as seen on image 79.  Bilateral incompletely characterized renal hypodensities. Splenomegaly. Cystic pancreatic lesions measuring 1.3 cm and 1.0 cm.  Heterogeneous opacity within the proximal abdominal aorta is nonspecific and may reflect mixing artifact.  Diffuse bilateral ground-glass opacity with mild underlying emphysematous changes.  No pneumothorax.  The central airways are patent.  Detailed parenchymal evaluation is degraded by respiratory motion.  Multilevel degenerative changes of the imaged spine. No acute or aggressive appearing osseous  lesion.  IMPRESSION: No pulmonary embolism identified.  Cardiomegaly with coronary artery calcification.  Dilation of the ascending aorta up to 4 cm.  Bilateral areas of ground-glass opacity may reflect atelectasis, atypical infection, or edema.  Mild background emphysema and areas of air trapping.  Cirrhotic liver morphology with portal hypertension as suggested by splenomegaly.  Questionable 1.5 cm lesion within segment 7/8.  Cystic foci along the pancreas may reflect cystic neoplasms or sequelae of prior pancreatitis / pseudocysts.  Correlate with outside prior dedicated abdominal imaging.   Original Report Authenticated By: Andrew  DelGaizo, M.D.     PHYSICAL EXAM General: NAD Neck: No JVD, no thyromegaly or thyroid nodule.  Lungs: Clear to auscultation bilaterally with normal respiratory effort. CV: Nondisplaced PMI.  Heart tachy, irregular S1/S2, no S3/S4, no murmur.  No peripheral edema.  No carotid bruit.  Normal pedal pulses.  Abdomen: Soft, nontender, no hepatosplenomegaly, no distention.  Neurologic: Alert and oriented x 3.  Psych: Normal affect. Extremities: No clubbing or cyanosis.   TELEMETRY: Reviewed telemetry pt in atrial fibrillation, rate around 130  ASSESSMENT AND PLAN: 79 yo with history of HCV and cirrhosis as well as HCC (will need radiation) and PAF was noted yesterday to be in atrial fibrillation with RVR.  He has not noted the abnormal heart rhythm so we are unsure how long he has been in it.  He is on diltiazem gtt but HR is still in the 130s.  I will plan TEE-guided DCCV today and will start warfarin.  He has mild thrombocytopenia with normal INR.  I am not sure that he will be a long-term coumadin candidate but should be able to take coumadin for at least a month.  He could potentially go home on a Lovenox => coumadin bridge.    Lee Allen 10/04/2012 8:19 AM   

## 2012-10-04 NOTE — ED Provider Notes (Signed)
Medical screening examination/treatment/procedure(s) were performed by non-physician practitioner and as supervising physician I was immediately available for consultation/collaboration.   Baylee Mccorkel, MD 10/04/12 1627 

## 2012-10-04 NOTE — Progress Notes (Signed)
ANTICOAGULATION CONSULT NOTE - Initial Consult  Pharmacy Consult for heparin Indication: atrial fibrillation  Allergies  Allergen Reactions  . Penicillins     Patient Measurements: Height: 5\' 8"  (172.7 cm) Weight: 180 lb (81.647 kg) IBW/kg (Calculated) : 68.4 Heparin Dosing Weight: 80kg  Vital Signs: Temp: 98.2 F (36.8 C) (02/20 1038) Temp src: Oral (02/20 1038) BP: 131/87 mmHg (02/20 1038) Pulse Rate: 106 (02/20 1038)  Labs:  Recent Labs  10/03/12 1851 10/04/12 0041 10/04/12 0511 10/04/12 1200  HGB 15.6  --  14.7 14.7  HCT 42.7  --  41.3 41.7  PLT 101*  --  95* 95*  LABPROT  --  14.2  --   --   INR  --  1.11  --   --   HEPARINUNFRC  --   --   --  0.24*  CREATININE 0.90  --  0.95  --   TROPONINI <0.30  --   --   --     Estimated Creatinine Clearance: 54 ml/min (by C-G formula based on Cr of 0.95).   Medical History: Past Medical History  Diagnosis Date  . Hepatitis C   . Liver cirrhosis   . Shingles   . Nephrolithiasis   . Kidney stones   . Skin cancer   . Dysrhythmia      tachycardia & atrial fibrilation  . GERD (gastroesophageal reflux disease)     Medications:  Scheduled:  . [COMPLETED] adenosine (ADENOCARD) IV  12 mg Intravenous Once  . [COMPLETED] diltiazem  5 mg Intravenous Once  . [COMPLETED] heparin  4,000 Units Intravenous Once  . [COMPLETED] metoprolol  2.5 mg Intravenous Once  . [COMPLETED] sodium chloride  500 mL Intravenous Once  . sodium chloride  3 mL Intravenous Q12H  . sodium chloride  3 mL Intravenous Q12H  . [DISCONTINUED] diltiazem  10 mg Intravenous Once   Infusions:  . sodium chloride    . diltiazem (CARDIZEM) infusion 5 mg/hr (10/04/12 0124)  . heparin 1,100 Units/hr (10/04/12 6295)    Assessment: 79yo male had resting HR of 130 at cancer center, had one-time episode of Afib in past, now in persistent Afib with failed adenosine conversion. Heparin was started last night, Plan for TEE-guided DCCV today and will also  start coumadin. Heparin level slightly subtherapeutic = 0.24 on 1100 units/hr, drawn 5.5 hrs after infusion start. No anticoagulation prior to admission, patient with hx of Hep C and liver cirrhosis, baseline INR 1.11. Pt with thrombocytopenia, plt 95, will watch closely.   Goal of Therapy:  Heparin level 0.3-0.7 units/ml Monitor platelets by anticoagulation protocol: Yes   Plan:  - Heparin bolus 1000 units  - Increase heparin rate to 1300 units/hr - recheck heparin level at 2130 - coumadin 7.5mg  po x 1 - daily PT/INR - coumadin book and video - coumadin education with pharmacist  Bayard Hugger, PharmD, BCPS  Clinical Pharmacist  Pager: [phone]   10/04/2012,12:59 PM

## 2012-10-04 NOTE — Consult Note (Addendum)
CARDIOLOGY CONSULT NOTE  Patient ID: Lee Allen, MRN: [id number], DOB/AGE: [birth year] 79 y.o. Admit date: 10/03/2012 Date of Consult: 10/04/2012  Primary Physician: Default, Provider, MD Primary Cardiologist: Dr. Lovina Reach  Chief Complaint: tachycardia Reason for Consultation: tachycardia  HPI: 79 y.o. male w/ PMHx significant for [facility] s/p ablation, Hep C, h/o single episode of afib who presented to [facility] on 10/04/2012 after being seen at outpatient rad-onc visit and was found to be tachycardic. Completely asymptomatic and unaware of his rapid heart rate. No chest pain, SOB, dyspnea, palpitations, decreased exercise tolerance.  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   Notably in 2012, seen by cardiology for a single episode of atrial fibrillation that occurred during a procedure. Resolved and not recorded since.  On telemetry, his rate is stuck at 127 for the past 2 hours. No response to 2.5 IV metoprolol. Underwent PE protocol CT which was negative for PE.  Due to the atypical appearance of the rhythm (?atach vs. Other), adenosine was used for diagnosis. 6 mg IV push without response. 12 mg IV push demonstrated underlying atypical atrial flutter.   Past Medical History  Diagnosis Date  . Hepatitis C   . Liver cirrhosis   . Shingles   . Nephrolithiasis   . Kidney stones   . Skin cancer       Surgical History:  Past Surgical History  Procedure Laterality Date  . Lithotripsy       Home Meds: Prior to Admission medications   Not on File    Inpatient Medications:  . adenosine (ADENOCARD) IV  12 mg Intravenous Once      Allergies:  Allergies  Allergen Reactions  . Penicillins     History   Social History  . Marital Status: Married    Spouse Name: N/A    Number of Children: N/A  . Years of Education: N/A   Occupational  History  . Not on file.   Social History Main Topics  . Smoking status: Former Smoker    Types: Cigarettes    Quit date: 08/15/1970  . Smokeless tobacco: Not on file  . Alcohol Use: 1.8 oz/week    3 Cans of beer per week     Comment: occasionally  . Drug Use: No  . Sexually Active: Not on file   Other Topics Concern  . Not on file   Social History Narrative  . No narrative on file     Family History  Problem Relation Age of Onset  . Leukemia Father      Review of Systems: General: negative for chills, fever, night sweats or weight changes.  Cardiovascular: see HPI. Dermatological: negative for rash Respiratory: negative for cough or wheezing Urologic: negative for hematuria Abdominal: negative for nausea, vomiting, diarrhea, bright red blood per rectum, melena, or hematemesis Neurologic: negative for visual changes, syncope, or dizziness All other systems reviewed and are otherwise negative except as noted above.  Labs:  Recent Labs  10/03/12 1851  TROPONINI <0.30   Lab Results  Component Value Date   WBC 5.3 10/03/2012   HGB 15.6 10/03/2012   HCT 42.7 10/03/2012   MCV 94.9 10/03/2012   PLT 101* 10/03/2012    Recent Labs Lab 10/03/12 1851  NA 140  K 3.7  CL 105  CO2 23  BUN 19  CREATININE 0.90  CALCIUM 9.5  PROT 7.6  BILITOT 1.0  ALKPHOS 111  ALT 129*  AST 132*  GLUCOSE 81    Radiology/Studies:  Ct Angio Chest W/cm &/or Wo Cm  10/03/2012  *RADIOLOGY REPORT*  Clinical Data: Tachycardia, liver cancer  CT ANGIOGRAPHY CHEST  Technique:  Multidetector CT imaging of the chest using the standard protocol during bolus administration of intravenous contrast. Multiplanar reconstructed images including MIPs were obtained and reviewed to evaluate the vascular anatomy.  Contrast: 80mL OMNIPAQUE IOHEXOL 350 MG/ML SOLN  Comparison: 06/26/2007 abdominal CT  Findings: Pulmonary arterial vessels are patent.  Cardiomegaly. Coronary artery calcification.  Dilatation of  the ascending aorta up to 4 cm, tapers along the arch.  Descending thoracic aorta measures 3.7 cm.  Aorta at the hiatus measures 3.3 cm.  No intrathoracic lymphadenopathy.  There are calcified right hilar lymph nodes.  Limited images through the upper abdomen show lobular hepatic contour and hepatic granuloma. Questionable enhancing lesion within segment 7/80 as seen on image 79.  Bilateral incompletely characterized renal hypodensities. Splenomegaly. Cystic pancreatic lesions measuring 1.3 cm and 1.0 cm.  Heterogeneous opacity within the proximal abdominal aorta is nonspecific and may reflect mixing artifact.  Diffuse bilateral ground-glass opacity with mild underlying emphysematous changes.  No pneumothorax.  The central airways are patent.  Detailed parenchymal evaluation is degraded by respiratory motion.  Multilevel degenerative changes of the imaged spine. No acute or aggressive appearing osseous lesion.  IMPRESSION: No pulmonary embolism identified.  Cardiomegaly with coronary artery calcification.  Dilation of the ascending aorta up to 4 cm.  Bilateral areas of ground-glass opacity may reflect atelectasis, atypical infection, or edema.  Mild background emphysema and areas of air trapping.  Cirrhotic liver morphology with portal hypertension as suggested by splenomegaly.  Questionable 1.5 cm lesion within segment 7/8.  Cystic foci along the pancreas may reflect cystic neoplasms or sequelae of prior pancreatitis / pseudocysts.  Correlate with outside prior dedicated abdominal imaging.   Original Report Authenticated By: Jearld Lesch, M.D.     EKG: tachycardia at 128 bpm See HPI for telemetry description  Physical Exam: Blood pressure 123/90, pulse 67, temperature 97.9 F (36.6 C), temperature source Oral, resp. rate 21, height 5\' 8"  (1.727 m), weight 81.647 kg (180 lb), SpO2 97.00%. General: Well developed, well nourished, in no acute distress. Head: Normocephalic, atraumatic, sclera non-icteric,  no xanthomas, nares are without discharge.  Neck: Supple. Negative for carotid bruits. JVD not elevated. Lungs: Clear bilaterally to auscultation without wheezes, rales, or rhonchi. Breathing is unlabored. Heart: tachycardic, no M/R/G Abdomen: Soft, non-tender, non-distended with normoactive bowel sounds. No hepatomegaly. No rebound/guarding. No obvious abdominal masses. Msk:  Strength and tone appear normal for age. Extremities: No clubbing or cyanosis. No edema.  Distal pedal pulses are 2+ and equal bilaterally. Neuro: Alert and oriented X 3. Moves all extremities spontaneously. Psych:  Responds to questions appropriately with a normal affect.   Problem List 1. Atrial flutter with RVR 2. HCC, treatment pending 3. Hep C 4. Cirrhosis with elevated LFTs 5. Mild thrombocytopenia 6. ?Interstitial lung disease  Assessment and Plan:  79 y.o. male w/ PMHx significant for [facility] s/p ablation, Hep C, h/o single episode of afib who presented to [facility] on 10/04/2012 with tachycardia --> consistent with atypical flutter with RVR (uncovered with adenosine).  Completely asymptomatic. Will admit and attempt to slow with diltiazem gtt. Unclear onset and duration.   Anti-coagulation is a little difficult due to his mild thrombocytopenia as well as cirrhosis (INR pending). Heparin  gtt if INR is not elevated.  Will keep NPO in case TEE and DCCV necessary tomorrow if he doesn't convert on his own. TTE ordered as well.  Heparin gtt for prophylaxis if INR okay. H2 blocker for GI prophylaxis Full code  Signed, Yocelyn Brocious C. MD 10/04/2012, 12:09 AM   This consult note will serve as H&P.

## 2012-10-04 NOTE — Progress Notes (Signed)
ANTICOAGULATION CONSULT NOTE - Initial Consult  Pharmacy Consult for heparin Indication: atrial fibrillation  Allergies  Allergen Reactions  . Penicillins     Patient Measurements: Height: 5\' 8"  (172.7 cm) Weight: 180 lb (81.647 kg) IBW/kg (Calculated) : 68.4 Heparin Dosing Weight: 80kg  Vital Signs: Temp: 97.8 F (36.6 C) (02/20 2100) Temp src: Oral (02/20 2100) BP: 112/68 mmHg (02/20 2100) Pulse Rate: 88 (02/20 2100)  Labs:  Recent Labs  10/03/12 1851 10/04/12 0041 10/04/12 0511 10/04/12 1200 10/04/12 2102  HGB 15.6  --  14.7 14.7  --   HCT 42.7  --  41.3 41.7  --   PLT 101*  --  95* 95*  --   LABPROT  --  14.2  --   --   --   INR  --  1.11  --   --   --   HEPARINUNFRC  --   --   --  0.24* 0.36  CREATININE 0.90  --  0.95  --   --   TROPONINI <0.30  --   --   --   --     Estimated Creatinine Clearance: 54 ml/min (by C-G formula based on Cr of 0.95).   Medical History: Past Medical History  Diagnosis Date  . Hepatitis C   . Liver cirrhosis   . Shingles   . Nephrolithiasis   . Kidney stones   . Skin cancer   . Dysrhythmia      tachycardia & atrial fibrilation  . GERD (gastroesophageal reflux disease)     Medications:  Scheduled:  . [COMPLETED] adenosine (ADENOCARD) IV  12 mg Intravenous Once  . [COMPLETED] diltiazem  5 mg Intravenous Once  . [COMPLETED] heparin  1,000 Units Intravenous Once  . [COMPLETED] heparin  4,000 Units Intravenous Once  . [COMPLETED] metoprolol  2.5 mg Intravenous Once  . [COMPLETED] sodium chloride  500 mL Intravenous Once  . sodium chloride  3 mL Intravenous Q12H  . [COMPLETED] warfarin  7.5 mg Oral ONCE-1800  . Warfarin - Pharmacist Dosing Inpatient   Does not apply q1800  . [DISCONTINUED] diltiazem  10 mg Intravenous Once  . [DISCONTINUED] sodium chloride  3 mL Intravenous Q12H  . [DISCONTINUED] sodium chloride  3 mL Intravenous Q12H   Infusions:  . sodium chloride    . sodium chloride    . heparin 1,300 Units/hr  (10/04/12 1322)  . [DISCONTINUED] diltiazem (CARDIZEM) infusion 5 mg/hr (10/04/12 0124)    Assessment: 79yo male had resting HR of 130 at cancer center, had one-time episode of Afib in past, now in persistent Afib with failed adenosine conversion. Heparin was started last night, Plan for TEE-guided DCCV today and will also start coumadin. Heparin level slightly subtherapeutic = 0.24 on 1100 units/hr, drawn 5.5 hrs after infusion start. No anticoagulation prior to admission, patient with hx of Hep C and liver cirrhosis, baseline INR 1.11. Pt with thrombocytopenia, plt 95, will watch closely.   Goal of Therapy:  Heparin level 0.3-0.7 units/ml Monitor platelets by anticoagulation protocol: Yes   Plan:   Increase heparin drip to 1400 units/hr F/u with AM level

## 2012-10-04 NOTE — Anesthesia Postprocedure Evaluation (Signed)
  Anesthesia Post-op Note  Patient: Lee Allen  Procedure(s) Performed: Procedure(s): TRANSESOPHAGEAL ECHOCARDIOGRAM (TEE) (N/A) TRANSESOPHAGEAL ECHOCARDIOGRAM WITH CARDIOVERSION  Patient Location: Endoscopy Unit  Anesthesia Type:General  Level of Consciousness: awake and alert   Airway and Oxygen Therapy: Patient Spontanous Breathing and Patient connected to nasal cannula oxygen  Post-op Pain: none  Post-op Assessment: Post-op Vital signs reviewed, Patient's Cardiovascular Status Stable, Respiratory Function Stable, Patent Airway and No signs of Nausea or vomiting  Post-op Vital Signs: Reviewed and stable  Complications: No apparent anesthesia complications

## 2012-10-04 NOTE — Anesthesia Preprocedure Evaluation (Signed)
Anesthesia Evaluation  Patient identified by MRN, date of birth, ID band Patient awake    Reviewed: Allergy & Precautions, H&P , NPO status , Patient's Chart, lab work & pertinent test results  History of Anesthesia Complications Negative for: history of anesthetic complications  Airway Mallampati: I TM Distance: >3 FB Neck ROM: Full    Dental  (+) Edentulous Upper, Poor Dentition and Dental Advisory Given   Pulmonary neg pulmonary ROS, COPD   Pulmonary exam normal       Cardiovascular + dysrhythmias Rhythm:Irregular Rate:Tachycardia     Neuro/Psych negative neurological ROS     GI/Hepatic GERD-  ,(+) Hepatitis -, C  Endo/Other    Renal/GU Renal InsufficiencyRenal disease     Musculoskeletal   Abdominal   Peds  Hematology   Anesthesia Other Findings   Reproductive/Obstetrics                           Anesthesia Physical Anesthesia Plan  ASA: III  Anesthesia Plan: General   Post-op Pain Management:    Induction: Intravenous  Airway Management Planned: Mask  Additional Equipment:   Intra-op Plan:   Post-operative Plan:   Informed Consent: I have reviewed the patients History and Physical, chart, labs and discussed the procedure including the risks, benefits and alternatives for the proposed anesthesia with the patient or authorized representative who has indicated his/her understanding and acceptance.   Dental advisory given  Plan Discussed with: CRNA, Anesthesiologist and Surgeon  Anesthesia Plan Comments:         Anesthesia Quick Evaluation

## 2012-10-04 NOTE — Transfer of Care (Signed)
Immediate Anesthesia Transfer of Care Note  Patient: Lee Allen  Procedure(s) Performed: Procedure(s): TRANSESOPHAGEAL ECHOCARDIOGRAM (TEE) (N/A) TRANSESOPHAGEAL ECHOCARDIOGRAM WITH CARDIOVERSION  Patient Location: Endoscopy Unit  Anesthesia Type:General  Level of Consciousness: responds to stimulation  Airway & Oxygen Therapy: Patient Spontanous Breathing and Patient connected to nasal cannula oxygen  Post-op Assessment: Report given to PACU RN and Post -op Vital signs reviewed and stable  Post vital signs: Reviewed and stable  Complications: No apparent anesthesia complications

## 2012-10-04 NOTE — Procedures (Signed)
Electrical Cardioversion Procedure Note Lee Allen [id number] 02-May-1925  Procedure: Electrical Cardioversion Indications:  Atrial Fibrillation  Procedure Details Consent: Risks of procedure as well as the alternatives and risks of each were explained to the (patient/caregiver).  Consent for procedure obtained. Time Out: Verified patient identification, verified procedure, site/side was marked, verified correct patient position, special equipment/implants available, medications/allergies/relevent history reviewed, required imaging and test results available.  Performed  Patient placed on cardiac monitor, pulse oximetry, supplemental oxygen as necessary.  Sedation given: Short-acting barbiturates Pacer pads placed anterior and posterior chest.  Cardioverted 1 time(s).  Cardioverted at 120J.  Evaluation Findings: Post procedure EKG shows: NSR Complications: None Patient did tolerate procedure well.   Lee Allen [facility] 10/04/2012, 3:54 PM

## 2012-10-04 NOTE — Progress Notes (Signed)
Patient ID: Lee Allen, male   DOB: 1925-03-20, 79 y.o.   MRN: [id number]    SUBJECTIVE: Patient remains in atrial fibrillation this morning, HR around 130.  He is not sure when the atrial fibrillation began.  No dyspnea/chest pain.   Current Facility-Administered Medications  Medication Dose Route Frequency Provider Last Rate Last Dose  . diltiazem (CARDIZEM) 100 mg in dextrose 5 % 100 mL infusion  5 mg/hr Intravenous Titrated Ardis Rowan, MD 5 mL/hr at 10/04/12 0124 5 mg/hr at 10/04/12 0124  . heparin ADULT infusion 100 units/mL (25000 units/250 mL)  1,100 Units/hr Intravenous Continuous Colleen Can, [place] 11 mL/hr at 10/04/12 0626 1,100 Units/hr at 10/04/12 0626  . sodium chloride 0.9 % injection 3 mL  3 mL Intravenous Q12H Ardis Rowan, MD      diltiazem gtt Heparin gtt    Filed Vitals:   10/04/12 0200 10/04/12 0230 10/04/12 0300 10/04/12 0350  BP: 110/82 98/79 104/74 123/79  Pulse: 130 132 70 130  Temp:    97.2 F (36.2 C)  TempSrc:    Oral  Resp:      Height:    5\' 8"  (1.727 m)  Weight:    180 lb (81.647 kg)  SpO2: 98% 96% 96% 98%   No intake or output data in the 24 hours ending 10/04/12 0816  LABS: Basic Metabolic Panel:  Recent Labs  40/98/11 1851 10/04/12 0511  NA 140 141  K 3.7 3.8  CL 105 107  CO2 23 23  GLUCOSE 81 129*  BUN 19 18  CREATININE 0.90 0.95  CALCIUM 9.5 8.7   Liver Function Tests:  Recent Labs  10/03/12 1851  AST 132*  ALT 129*  ALKPHOS 111  BILITOT 1.0  PROT 7.6  ALBUMIN 3.4*   No results found for this basename: LIPASE, AMYLASE,  in the last 72 hours CBC:  Recent Labs  10/03/12 1851 10/04/12 0511  WBC 5.3 4.2  NEUTROABS 2.5  --   HGB 15.6 14.7  HCT 42.7 41.3  MCV 94.9 95.8  PLT 101* 95*   Cardiac Enzymes:  Recent Labs  10/03/12 1851  TROPONINI <0.30   BNP: No components found with this basename: POCBNP,  D-Dimer: No results found for this basename: DDIMER,  in the last 72 hours Hemoglobin  A1C: No results found for this basename: HGBA1C,  in the last 72 hours Fasting Lipid Panel: No results found for this basename: CHOL, HDL, LDLCALC, TRIG, CHOLHDL, LDLDIRECT,  in the last 72 hours Thyroid Function Tests: No results found for this basename: TSH, T4TOTAL, FREET3, T3FREE, THYROIDAB,  in the last 72 hours Anemia Panel: No results found for this basename: VITAMINB12, FOLATE, FERRITIN, TIBC, IRON, RETICCTPCT,  in the last 72 hours  RADIOLOGY: Ct Angio Chest W/cm &/or Wo Cm  10/03/2012  *RADIOLOGY REPORT*  Clinical Data: Tachycardia, liver cancer  CT ANGIOGRAPHY CHEST  Technique:  Multidetector CT imaging of the chest using the standard protocol during bolus administration of intravenous contrast. Multiplanar reconstructed images including MIPs were obtained and reviewed to evaluate the vascular anatomy.  Contrast: 80mL OMNIPAQUE IOHEXOL 350 MG/ML SOLN  Comparison: 06/26/2007 abdominal CT  Findings: Pulmonary arterial vessels are patent.  Cardiomegaly. Coronary artery calcification.  Dilatation of the ascending aorta up to 4 cm, tapers along the arch.  Descending thoracic aorta measures 3.7 cm.  Aorta at the hiatus measures 3.3 cm.  No intrathoracic lymphadenopathy.  There are calcified right hilar lymph nodes.  Limited images through the upper  abdomen show lobular hepatic contour and hepatic granuloma. Questionable enhancing lesion within segment 7/80 as seen on image 79.  Bilateral incompletely characterized renal hypodensities. Splenomegaly. Cystic pancreatic lesions measuring 1.3 cm and 1.0 cm.  Heterogeneous opacity within the proximal abdominal aorta is nonspecific and may reflect mixing artifact.  Diffuse bilateral ground-glass opacity with mild underlying emphysematous changes.  No pneumothorax.  The central airways are patent.  Detailed parenchymal evaluation is degraded by respiratory motion.  Multilevel degenerative changes of the imaged spine. No acute or aggressive appearing osseous  lesion.  IMPRESSION: No pulmonary embolism identified.  Cardiomegaly with coronary artery calcification.  Dilation of the ascending aorta up to 4 cm.  Bilateral areas of ground-glass opacity may reflect atelectasis, atypical infection, or edema.  Mild background emphysema and areas of air trapping.  Cirrhotic liver morphology with portal hypertension as suggested by splenomegaly.  Questionable 1.5 cm lesion within segment 7/8.  Cystic foci along the pancreas may reflect cystic neoplasms or sequelae of prior pancreatitis / pseudocysts.  Correlate with outside prior dedicated abdominal imaging.   Original Report Authenticated By: Jearld Lesch, M.D.     PHYSICAL EXAM General: NAD Neck: No JVD, no thyromegaly or thyroid nodule.  Lungs: Clear to auscultation bilaterally with normal respiratory effort. CV: Nondisplaced PMI.  Heart tachy, irregular S1/S2, no S3/S4, no murmur.  No peripheral edema.  No carotid bruit.  Normal pedal pulses.  Abdomen: Soft, nontender, no hepatosplenomegaly, no distention.  Neurologic: Alert and oriented x 3.  Psych: Normal affect. Extremities: No clubbing or cyanosis.   TELEMETRY: Reviewed telemetry pt in atrial fibrillation, rate around 130  ASSESSMENT AND PLAN: 79 yo with history of HCV and cirrhosis as well as HCC (will need radiation) and PAF was noted yesterday to be in atrial fibrillation with RVR.  He has not noted the abnormal heart rhythm so we are unsure how long he has been in it.  He is on diltiazem gtt but HR is still in the 130s.  I will plan TEE-guided DCCV today and will start warfarin.  He has mild thrombocytopenia with normal INR.  I am not sure that he will be a long-term coumadin candidate but should be able to take coumadin for at least a month.  He could potentially go home on a Lovenox => coumadin bridge.    [name] 10/04/2012 8:19 AM

## 2012-10-04 NOTE — ED Notes (Signed)
Attempted to concert with 6mg  Adensine without luck,  12mg  attempted without change.  MD at the bedside

## 2012-10-04 NOTE — Progress Notes (Signed)
Echocardiogram Echocardiogram Transesophageal has been performed.  Lee Allen 10/04/2012, 4:06 PM

## 2012-10-04 NOTE — Interval H&P Note (Signed)
History and Physical Interval Note:  10/04/2012 3:23 PM  Lee Allen  has presented today for surgery, with the diagnosis of afib  The various methods of treatment have been discussed with the patient and family. After consideration of risks, benefits and other options for treatment, the patient has consented to  Procedure(s): TRANSESOPHAGEAL ECHOCARDIOGRAM (TEE) (N/A) as a surgical intervention .  The patient's history has been reviewed, patient examined, no change in status, stable for surgery.  I have reviewed the patient's chart and labs.  Questions were answered to the patient's satisfaction.     Peter [place] MD,FACC 10/04/2012 3:23 PM

## 2012-10-05 ENCOUNTER — Encounter (HOSPITAL_COMMUNITY): Payer: Self-pay | Admitting: Cardiology

## 2012-10-05 DIAGNOSIS — I4891 Unspecified atrial fibrillation: Secondary | ICD-10-CM | POA: Diagnosis present

## 2012-10-05 DIAGNOSIS — B182 Chronic viral hepatitis C: Secondary | ICD-10-CM | POA: Diagnosis present

## 2012-10-05 LAB — CBC
Hemoglobin: 13.3 g/dL (ref 13.0–17.0)
MCH: 33.7 pg (ref 26.0–34.0)
RBC: 3.95 MIL/uL — ABNORMAL LOW (ref 4.22–5.81)
WBC: 3.2 10*3/uL — ABNORMAL LOW (ref 4.0–10.5)

## 2012-10-05 LAB — PROTIME-INR
INR: 1.13 (ref 0.00–1.49)
Prothrombin Time: 14.3 seconds (ref 11.6–15.2)

## 2012-10-05 LAB — HEPARIN LEVEL (UNFRACTIONATED): Heparin Unfractionated: 0.51 IU/mL (ref 0.30–0.70)

## 2012-10-05 MED ORDER — WARFARIN SODIUM 5 MG PO TABS: ORAL_TABLET | ORAL | Status: DC

## 2012-10-05 MED ORDER — METOPROLOL TARTRATE 25 MG PO TABS
25.0000 mg | ORAL_TABLET | Freq: Two times a day (BID) | ORAL | Status: DC
  Administered 2012-10-05: 25 mg via ORAL
  Filled 2012-10-05 (×3): qty 1

## 2012-10-05 MED ORDER — ENOXAPARIN (LOVENOX) PATIENT EDUCATION KIT
PACK | Freq: Once | Status: AC
  Administered 2012-10-05: 17:00:00
  Filled 2012-10-05: qty 1

## 2012-10-05 MED ORDER — METOPROLOL TARTRATE 25 MG PO TABS: 25.0000 mg | ORAL_TABLET | Freq: Two times a day (BID) | ORAL | Status: DC

## 2012-10-05 MED ORDER — WARFARIN SODIUM 7.5 MG PO TABS
7.5000 mg | ORAL_TABLET | Freq: Once | ORAL | Status: AC
  Administered 2012-10-05: 7.5 mg via ORAL
  Filled 2012-10-05: qty 1

## 2012-10-05 MED ORDER — ENOXAPARIN SODIUM 80 MG/0.8ML ~~LOC~~ SOLN: 80.0000 mg | Freq: Two times a day (BID) | SUBCUTANEOUS | Status: DC

## 2012-10-05 MED ORDER — HEPARIN (PORCINE) IN NACL 100-0.45 UNIT/ML-% IJ SOLN
1400.0000 [IU]/h | INTRAMUSCULAR | Status: AC
  Filled 2012-10-05: qty 250

## 2012-10-05 MED ORDER — ENOXAPARIN SODIUM 80 MG/0.8ML ~~LOC~~ SOLN
80.0000 mg | Freq: Two times a day (BID) | SUBCUTANEOUS | Status: DC
  Administered 2012-10-05: 80 mg via SUBCUTANEOUS
  Filled 2012-10-05 (×2): qty 0.8

## 2012-10-05 NOTE — Progress Notes (Signed)
TELEMETRY: Reviewed telemetry pt in NSR, rare PVC: Filed Vitals:   10/04/12 1700 10/04/12 1717 10/04/12 2100 10/05/12 0449  BP: 118/71  112/68 116/75  Pulse: 78  88 75  Temp: 97.9 F (36.6 C)  97.8 F (36.6 C) 98.3 F (36.8 C)  TempSrc: Oral  Oral Oral  Resp: 18 18 20 16   Height:      Weight:    179 lb 1.6 oz (81.239 kg)  SpO2: 93% 95% 97% 97%    Intake/Output Summary (Last 24 hours) at 10/05/12 0856 Last data filed at 10/05/12 1610  Gross per 24 hour  Intake    600 ml  Output    175 ml  Net    425 ml    SUBJECTIVE Feels very well, anxious to go home.  LABS: Basic Metabolic Panel:  Recent Labs  96/04/54 1851 10/04/12 0511  NA 140 141  K 3.7 3.8  CL 105 107  CO2 23 23  GLUCOSE 81 129*  BUN 19 18  CREATININE 0.90 0.95  CALCIUM 9.5 8.7   Liver Function Tests:  Recent Labs  10/03/12 1851  AST 132*  ALT 129*  ALKPHOS 111  BILITOT 1.0  PROT 7.6  ALBUMIN 3.4*   CBC:  Recent Labs  10/03/12 1851  10/04/12 1200 10/05/12 0702  WBC 5.3  < > 4.6 3.2*  NEUTROABS 2.5  --   --   --   HGB 15.6  < > 14.7 13.3  HCT 42.7  < > 41.7 37.9*  MCV 94.9  < > 95.0 95.9  PLT 101*  < > 95* 74*  < > = values in this interval not displayed. Cardiac Enzymes:  Recent Labs  10/03/12 1851  TROPONINI <0.30   Thyroid Function Tests:  Recent Labs  10/04/12 0511  TSH 4.506*   Radiology/Studies:  Ct Angio Chest W/cm &/or Wo Cm  10/03/2012  *RADIOLOGY REPORT*  Clinical Data: Tachycardia, liver cancer  CT ANGIOGRAPHY CHEST  Technique:  Multidetector CT imaging of the chest using the standard protocol during bolus administration of intravenous contrast. Multiplanar reconstructed images including MIPs were obtained and reviewed to evaluate the vascular anatomy.  Contrast: 80mL OMNIPAQUE IOHEXOL 350 MG/ML SOLN  Comparison: 06/26/2007 abdominal CT  Findings: Pulmonary arterial vessels are patent.  Cardiomegaly. Coronary artery calcification.  Dilatation of the ascending  aorta up to 4 cm, tapers along the arch.  Descending thoracic aorta measures 3.7 cm.  Aorta at the hiatus measures 3.3 cm.  No intrathoracic lymphadenopathy.  There are calcified right hilar lymph nodes.  Limited images through the upper abdomen show lobular hepatic contour and hepatic granuloma. Questionable enhancing lesion within segment 7/80 as seen on image 79.  Bilateral incompletely characterized renal hypodensities. Splenomegaly. Cystic pancreatic lesions measuring 1.3 cm and 1.0 cm.  Heterogeneous opacity within the proximal abdominal aorta is nonspecific and may reflect mixing artifact.  Diffuse bilateral ground-glass opacity with mild underlying emphysematous changes.  No pneumothorax.  The central airways are patent.  Detailed parenchymal evaluation is degraded by respiratory motion.  Multilevel degenerative changes of the imaged spine. No acute or aggressive appearing osseous lesion.  IMPRESSION: No pulmonary embolism identified.  Cardiomegaly with coronary artery calcification.  Dilation of the ascending aorta up to 4 cm.  Bilateral areas of ground-glass opacity may reflect atelectasis, atypical infection, or edema.  Mild background emphysema and areas of air trapping.  Cirrhotic liver morphology with portal hypertension as suggested by splenomegaly.  Questionable 1.5 cm lesion within segment 7/8.  Cystic foci along the pancreas may reflect cystic neoplasms or sequelae of prior pancreatitis / pseudocysts.  Correlate with outside prior dedicated abdominal imaging.   Original Report Authenticated By: Jearld Lesch, M.D.    ECG: 04-Oct-2012 16:12:47 [facility]  Poor data quality, interpretation may be adversely affected Sinus rhythm with Premature atrial complexes Left axis deviation Abnormal ECG   PHYSICAL EXAM General: Well developed, elderly, in no acute distress. Head: Normocephalic, atraumatic, sclera non-icteric, no xanthomas, nares are without  discharge. Neck: Negative for carotid bruits. JVD not elevated. Lungs: Clear bilaterally to auscultation without wheezes, rales, or rhonchi. Breathing is unlabored. Heart: RRR S1 S2 with soft 1/6 SEM Abdomen: Soft, non-tender, non-distended with normoactive bowel sounds. No hepatomegaly. No rebound/guarding. No obvious abdominal masses. Msk:  Strength and tone appears normal for age. Extremities: No clubbing, cyanosis or edema.  Distal pedal pulses are 2+ and equal bilaterally. Neuro: Alert and oriented X 3. Moves all extremities spontaneously. Psych:  Responds to questions appropriately with a normal affect.  ASSESSMENT AND PLAN: 1. Atrial fibrillation- maintaining NSR post DCCV. Starting coumadin. Would like to anticoagulate 4-6 weeks. Probably not a good candidate for long term anticoagulation. Patient is a good candidate for bridging Lovenox. If we can work this out today we will DC with follow up in our coumadin clinic. 2. Hepatocellular carcinoma with extension to Abdominal/chest wall. Plans for radiation therapy at [facility]. 3. Cirrhosis.  Principal Problem:   Atrial fibrillation Active Problems:   Cirrhosis of liver due to hepatitis C    Signed, Khrystyna Schwalm [place] MD,FACC 10/05/2012 8:56 AM

## 2012-10-05 NOTE — Progress Notes (Signed)
ANTICOAGULATION CONSULT NOTE - Follow Up Consult  Pharmacy Consult for Heparin-->Lovenox; Coumadin Indication: atrial fibrillation  Allergies  Allergen Reactions  . Penicillins     Patient Measurements: Height: 5\' 8"  (172.7 cm) Weight: 179 lb 1.6 oz (81.239 kg) IBW/kg (Calculated) : 68.4 Heparin Dosing Weight: 80kg  Vital Signs: Temp: 98.3 F (36.8 C) (02/21 0449) Temp src: Oral (02/21 0449) BP: 133/72 mmHg (02/21 1103) Pulse Rate: 73 (02/21 1103)  Labs:  Recent Labs  10/03/12 1851 10/04/12 0041 10/04/12 0511 10/04/12 1200 10/04/12 2102 10/05/12 0702  HGB 15.6  --  14.7 14.7  --  13.3  HCT 42.7  --  41.3 41.7  --  37.9*  PLT 101*  --  95* 95*  --  74*  LABPROT  --  14.2  --   --   --  14.3  INR  --  1.11  --   --   --  1.13  HEPARINUNFRC  --   --   --  0.24* 0.36 0.51  CREATININE 0.90  --  0.95  --   --   --   TROPONINI <0.30  --   --   --   --   --     Estimated Creatinine Clearance: 54 ml/min (by C-G formula based on Cr of 0.95).   Medications:  Heparin 1400 units/hr (14 ml/hr)  Assessment: 88yom s/p DCCV on heparin bridging to Coumadin for new onset Afib. Pharmacy has been consulted to transition patient from heparin to Lovenox in preparation for discharge. Heparin level (0.51) is therapeutic x 2. Discussed concerns of using Lovenox/Coumadin with low platelets (74) and liver cirrhosis/HepC with elevated LFTs with Cardiology PA [facility] - received orders to continue with Lovenox bridge per Dr. Sw[place]and and patient will have CBC and INR checked on Monday (2/24). INR (1.13) is subtherapeutic as expected following Coumadin 7.5mg  x 1. - Hg 13.3, Plts 74 (trending down for 95) - AST/ALT (132/129) - No significant bleeding reported - Warfarin points: 5  Goal of Therapy:  INR 2-3 Heparin level 0.3-0.7 units/ml Anti-Xa level 0.6-1.2 units/ml 4hrs after LMWH dose given Monitor platelets by anticoagulation protocol: Yes   Plan:  1. Continue heparin drip 1400 units/hr  (14 ml/hr) - discontinue @ 1530 today 2. Lovenox 80mg  (~1mg /kg) SQ q12h - first dose @ 1630 (~1 hr after heparin stopped) 3. Coumadin 7.5mg  po x 1 today 4. Recommend Coumadin 5mg  daily for discharge with INR and CBC follow-up on Monday 5. Discussed anticoagulation plan and educated patient  Cleon Dew [phone] 10/05/2012,11:54 AM

## 2012-10-05 NOTE — Progress Notes (Signed)
Discharge review done with patient.  Lovenox teaching done with patient and a family member.  They both acknowledged understanding of information provided.  Prescriptions sent electronically to patient's pharmacy by MD.  Patient is stable and discharged home with family member. Lee Allen

## 2012-10-05 NOTE — Discharge Summary (Signed)
Discharge Summary   Patient ID: Lee Allen MRN: [id number], DOB/AGE: 1924-11-27 79 y.o.  Primary MD: Dr. Marina Goodell in [place]  Primary Cardiologist: Dr. Tenny Craw Admit date: 10/03/2012 D/C date:     10/05/2012      Primary Discharge Diagnoses:  1. Atrial Flutter w/ RVR  - Newly diagnosed this admission  - s/p successful TEE guided DCCV 10/04/12  - Anticoagulation w/ Coumadin, home with Lovenox bridge  - Not felt to be a good long-term candidate due to Hepatocellular Carcinoma  2. Thrombocytopenia  - Plts 74k at discharge in the setting of HCC  - No signs of active bleeding   - F/u CBC  Secondary Discharge Diagnoses:  . Hepatitis C   . Liver cirrhosis   . Shingles   . Nephrolithiasis   . Skin cancer   . GERD (gastroesophageal reflux disease)   . HCC (hepatocellular carcinoma)     s/p ablation, with extension to Abdominal/chest wall  . History of tobacco abuse     quit 1972    Allergies Allergies  Allergen Reactions  . Penicillins     Diagnostic Studies/Procedures:   10/03/2012  -  CT ANGIOGRAPHY CHEST  Findings: Pulmonary arterial vessels are patent.  Cardiomegaly. Coronary artery calcification.  Dilatation of the ascending aorta up to 4 cm, tapers along the arch.  Descending thoracic aorta measures 3.7 cm.  Aorta at the hiatus measures 3.3 cm.  No intrathoracic lymphadenopathy.  There are calcified right hilar lymph nodes.  Limited images through the upper abdomen show lobular hepatic contour and hepatic granuloma. Questionable enhancing lesion within segment 7/80 as seen on image 79.  Bilateral incompletely characterized renal hypodensities. Splenomegaly. Cystic pancreatic lesions measuring 1.3 cm and 1.0 cm.  Heterogeneous opacity within the proximal abdominal aorta is nonspecific and may reflect mixing artifact.  Diffuse bilateral ground-glass opacity with mild underlying emphysematous changes.  No pneumothorax.  The central airways are patent.  Detailed parenchymal  evaluation is degraded by respiratory motion.  Multilevel degenerative changes of the imaged spine. No acute or aggressive appearing osseous lesion.  IMPRESSION: No pulmonary embolism identified.  Cardiomegaly with coronary artery calcification.  Dilation of the ascending aorta up to 4 cm.  Bilateral areas of ground-glass opacity may reflect atelectasis, atypical infection, or edema.  Mild background emphysema and areas of air trapping.  Cirrhotic liver morphology with portal hypertension as suggested by splenomegaly.  Questionable 1.5 cm lesion within segment 7/8.  Cystic foci along the pancreas may reflect cystic neoplasms or sequelae of prior pancreatitis / pseudocysts.  Correlate with outside prior dedicated abdominal imaging.     10/04/12 - TEE Findings: Normal LV function. EF 55-60%  Mild mitral annular calcification. Mild to moderate MR.  Aortic valve leaflets thickened with mild calcification. No aortic stenosis or insufficiency.  Tricuspid and pulmonic valves are normal.  Normal LA/LAA without thrombus.  10/04/12 - DCCV Cardioverted 1 time(s). Cardioverted at 120J.  Evaluation  Findings: Post procedure EKG shows: NSR   History of Present Illness: 79 y.o. male w/ the above medical problems who presented to [facility] on 10/03[facility]12 after being found tachycardic at his outpatient rad-on visit and was found to be in Atrial Flutter w/ RVR.  Hospital Course: In the ED he was asymptomatic and hemodynamically stable. EKG revealed atrial tachycardia at 128bpm. He was given adenosine which demonstrated underlying atypical atrial flutter. CTA chest was without evidence of PE. Labs were significant for normal troponin, Plt 101k, ALT/AST 129/132, otherwise unremarkable CBC/BMET. He  was placed on IV heparin and IV cardizem and admitted for further evaluation and treatment. He remained in atrial flutter with elevated rates despite IV cardizem. It was felt he would benefit from DCCV, but would need a  TEE prior as he was asymptomatic and unsure of onset. TEE was performed and without evidence of thrombus. DCCV was successfully performed with conversion to NSR. He was initiated on coumadin with plans for at least one month of anticoagulation, but not felt to be a good long term candidate due to thrombocytopenia and HCC. He remained in NSR and was placed on Metoprolol 25mg  BID to maintain rate control. His INR was subtherapeutic at time of discharge so he was discharged on Lovenox bridge after he received education regarding its use. His platelets were 74k at time of discharge, but there were no signs of active bleeding and Dr. Swaziland felt he could safely be discharged on Lovenox with plans for CBC check on 2/24. He was instructed to monitor for signs of bleeding. Plans were made for close follow up at our coumadin clinic.  He was seen and evaluated by Dr. Swaziland who felt he was stable for discharge home with plans for follow up as scheduled below.  Discharge Vitals: Blood pressure 133/72, pulse 73, temperature 98.3 F (36.8 C), temperature source Oral, resp. rate 16, height 5\' 8"  (1.727 m), weight 179 lb 1.6 oz (81.239 kg), SpO2 97.00%.  Labs:  Component Value Date   WBC 3.2* 10/05/2012   HGB 13.3 10/05/2012   HCT 37.9* 10/05/2012   MCV 95.9 10/05/2012   PLT 74* 10/05/2012    10/05/2012 07:02  Prothrombin Time 14.3  INR 1.13   Lab 10/03/12 1851 10/04/12 0511  NA 140 141  K 3.7 3.8  CL 105 107  CO2 23 23  BUN 19 18  CREATININE 0.90 0.95  CALCIUM 9.5 8.7  PROT 7.6  --   BILITOT 1.0  --   ALKPHOS 111  --   ALT 129*  --   AST 132*  --   GLUCOSE 81 129*    10/03/12 1851  TROPONINI <0.30    10/04/2012 05:11  TSH 4.506 (H)     Discharge Medications     Medication List    TAKE these medications       enoxaparin 80 MG/0.8ML injection  Commonly known as:  LOVENOX  Inject 0.8 mLs (80 mg total) into the skin every 12 (twelve) hours.     metoprolol tartrate 25 MG tablet    Commonly known as:  LOPRESSOR  Take 1 tablet (25 mg total) by mouth 2 (two) times daily.     warfarin 5 MG tablet  Commonly known as:  COUMADIN  Take 1 1/2 tablets (7.5mg ) tonight (2/21) then take 1 tablet (5mg ) on Saturday (2/22) and Sunday (2/23) evening.        Disposition   Discharge Orders   Future Appointments Provider Department Dept Phone   10/08/2012 10:25 AM [facility] Main Office [place]) [phone]   10/08/2012 10:35 AM [facility] [phone]   10/17/2012 2:20 PM Beatrice Lecher, PA [facility] Main Office [place]) [phone]   Future Orders Complete By Expires     Diet - low sodium heart healthy  As directed     Discharge instructions  As directed     Comments:      * Please take all medications as prescribed and bring them with you to your office visit  *  You will receive your first Lovenox injection today before you leave the hospital. Starting tomorrow (2/22) you will administer one Lovenox shot twice a day (12 hours apart).   * You will take Warfarin 1 1/2 tablets (7.5mg ) tonight (2/21) then take 1 tablet (5mg ) on Saturday (2/22) and Sunday (2/23) evening. Have your INR checked on 2/24 for further dosing instructions  * Please monitor for signs of bleeding and notify our office or seek immediate medical attention if you develop bleeding.    Increase activity slowly  As directed       Follow-up Information   Follow up with [facility] On 10/08/2012. (10:35)    Contact information:   [facility] [address], [address] [place] [address] [phone]      Follow up with Tereso Newcomer, PA On 10/17/2012. (2:20)    Contact information:   [facility] [address] [place] [address] [phone]        Outstanding Labs/Studies:  INR CBC  Duration of Discharge Encounter: Greater than 30 minutes including  physician and PA time.  Signed, Julus Kelley PA-C 10/05/2012, 2:20 PM

## 2012-10-05 NOTE — Care Management Note (Addendum)
    Page 1 of 1   10/05/2012     2:10:50 PM   CARE MANAGEMENT NOTE 10/05/2012  Patient:  Lee Allen, Lee Allen   Account Number:  [id number]  Date Initiated:  10/05/2012  Documentation initiated by:  GRAVES-BIGELOW,Jim Philemon  Subjective/Objective Assessment:   Pt admitted with afib. S/p cardioversion. Plan for d/c home today on lovenox.     Action/Plan:   CM will call [facility] for lovenox generic pricing:   Anticipated DC Date:  10/05/2012   Anticipated DC Plan:  HOME/SELF CARE      DC Planning Services  CM consult  Medication Assistance      Choice offered to / List presented to:             Status of service:  Completed, signed off Medicare Important Message given?   (If response is "NO", the following Medicare IM given date fields will be blank) Date Medicare IM given:   Date Additional Medicare IM given:    Discharge Disposition:  HOME/SELF CARE  Per UR Regulation:  Reviewed for med. necessity/level of care/duration of stay  If discussed at Long Length of Stay Meetings, dates discussed:    Comments:  10-05-12 1402 Tomi Bamberger, RN,BSN [phone] CM did speak to pt and he is aware that co pay cost will be 360.00 for 14 syringes of lovenox. [facility] [place] to make them aware to prepare.  10-05-12 [address], [place] [phone]  CM did call [facility] in [place] and test claim for cost could not be done due to needs new insurance cards. CM did call pt's daughter and she will call me back with information. RN to show pt how to use lovenox injections and per pt daughter will be administering the injections. Will continue to f/u.

## 2012-10-08 ENCOUNTER — Ambulatory Visit (INDEPENDENT_AMBULATORY_CARE_PROVIDER_SITE_OTHER): Payer: Medicare Other | Admitting: *Deleted

## 2012-10-08 DIAGNOSIS — B1920 Unspecified viral hepatitis C without hepatic coma: Secondary | ICD-10-CM | POA: Diagnosis not present

## 2012-10-08 DIAGNOSIS — Z7901 Long term (current) use of anticoagulants: Secondary | ICD-10-CM | POA: Diagnosis not present

## 2012-10-08 LAB — CBC WITH DIFFERENTIAL/PLATELET
Basophils Relative: 0.3 % (ref 0.0–3.0)
Eosinophils Absolute: 0.1 10*3/uL (ref 0.0–0.7)
Lymphs Abs: 1.2 10*3/uL (ref 0.7–4.0)
MCHC: 34.3 g/dL (ref 30.0–36.0)
MCV: 98.6 fl (ref 78.0–100.0)
Monocytes Absolute: 0.2 10*3/uL (ref 0.1–1.0)
Neutrophils Relative %: 59.7 % (ref 43.0–77.0)
RBC: 4.03 Mil/uL — ABNORMAL LOW (ref 4.22–5.81)

## 2012-10-08 NOTE — Patient Instructions (Signed)

## 2012-10-08 NOTE — Discharge Summary (Signed)
Patient seen and examined and history reviewed. Agree with above findings and plan. See my rounding note earlier.  Lee Allen 10/08/2012 2:45 PM

## 2012-10-10 ENCOUNTER — Ambulatory Visit (INDEPENDENT_AMBULATORY_CARE_PROVIDER_SITE_OTHER): Payer: Medicare Other | Admitting: *Deleted

## 2012-10-10 DIAGNOSIS — Z7901 Long term (current) use of anticoagulants: Secondary | ICD-10-CM

## 2012-10-10 DIAGNOSIS — Z85828 Personal history of other malignant neoplasm of skin: Secondary | ICD-10-CM | POA: Diagnosis not present

## 2012-10-10 DIAGNOSIS — B1920 Unspecified viral hepatitis C without hepatic coma: Secondary | ICD-10-CM | POA: Diagnosis not present

## 2012-10-10 DIAGNOSIS — B182 Chronic viral hepatitis C: Secondary | ICD-10-CM | POA: Diagnosis not present

## 2012-10-10 DIAGNOSIS — I4891 Unspecified atrial fibrillation: Secondary | ICD-10-CM | POA: Diagnosis not present

## 2012-10-16 DIAGNOSIS — B1920 Unspecified viral hepatitis C without hepatic coma: Secondary | ICD-10-CM | POA: Diagnosis not present

## 2012-10-16 DIAGNOSIS — C228 Malignant neoplasm of liver, primary, unspecified as to type: Secondary | ICD-10-CM | POA: Diagnosis not present

## 2012-10-16 DIAGNOSIS — C50919 Malignant neoplasm of unspecified site of unspecified female breast: Secondary | ICD-10-CM | POA: Diagnosis not present

## 2012-10-16 DIAGNOSIS — K7460 Unspecified cirrhosis of liver: Secondary | ICD-10-CM | POA: Diagnosis not present

## 2012-10-17 ENCOUNTER — Ambulatory Visit (INDEPENDENT_AMBULATORY_CARE_PROVIDER_SITE_OTHER): Payer: Medicare Other | Admitting: *Deleted

## 2012-10-17 ENCOUNTER — Ambulatory Visit (INDEPENDENT_AMBULATORY_CARE_PROVIDER_SITE_OTHER): Payer: Medicare Other | Admitting: Physician Assistant

## 2012-10-17 ENCOUNTER — Encounter: Payer: Self-pay | Admitting: Physician Assistant

## 2012-10-17 VITALS — BP 134/70 | HR 54 | Ht 68.0 in | Wt 181.0 lb

## 2012-10-17 DIAGNOSIS — I4891 Unspecified atrial fibrillation: Secondary | ICD-10-CM

## 2012-10-17 DIAGNOSIS — C228 Malignant neoplasm of liver, primary, unspecified as to type: Secondary | ICD-10-CM | POA: Diagnosis not present

## 2012-10-17 DIAGNOSIS — D696 Thrombocytopenia, unspecified: Secondary | ICD-10-CM | POA: Diagnosis not present

## 2012-10-17 DIAGNOSIS — B1920 Unspecified viral hepatitis C without hepatic coma: Secondary | ICD-10-CM | POA: Diagnosis not present

## 2012-10-17 DIAGNOSIS — Z7901 Long term (current) use of anticoagulants: Secondary | ICD-10-CM

## 2012-10-17 DIAGNOSIS — K7460 Unspecified cirrhosis of liver: Secondary | ICD-10-CM | POA: Diagnosis not present

## 2012-10-17 LAB — POCT INR: INR: 5.7

## 2012-10-17 NOTE — Patient Instructions (Addendum)
Your physician recommends that you return for lab work in: 10/24/12 for a CBC W/DIFF  PLEASE FOLLOW UP WITH [place], [facility] 11/23/12 @ 12:10  NO CHANGES WERE MADE TODAY

## 2012-10-17 NOTE — Progress Notes (Signed)
[address] [place], [place]  [address] Phone: [phone], Fax:  [phone]  Date:  10/17/2012   ID:  Lee Allen, DOB [birth year], MRN [id number]  PCP:  Abigail Miyamoto, MD  Primary Cardiologist:  Dr. Dietrich Pates     History of Present Illness: Lee Allen is a 79 y.o. male who presents for follow up after a recent admission to the hospital.  He has a hx of HCV, hepatocellular CA, s/p ablation with seeding of thoracic wall with tumor (pending radiation), cirrhosis, thrombocytopenia.  He was admitted 2/19-2/24 with AFib/Flutter with RVR.  Chest CT was negative for PE. He does have coronary calcification and dilated ascending aorta (4 cm).  He was placed on heparin/coumadin and underwent TEE-DCCV restoring NSR.  TEE 10/04/12:  EF 60-65%, mild to mod MR, mild LAE.  Patient is now followed closely in our coumadin clinic.  He requires close follow up on his Hgb and PLT due to his thrombocytopenia from [facility].  Recent CBC was stable.  The patient denies chest pain, shortness of breath, syncope, orthopnea, PND or significant pedal edema.   Labs (2/14):  WBC 3.7, Hgb 13.7, PLT 92K, K 3.8, creatinine 0.95, ALT 129, TSH 4.506  Wt Readings from Last 3 Encounters:  10/17/12 181 lb (82.101 kg)  10/05/12 179 lb 1.6 oz (81.239 kg)  10/05/12 179 lb 1.6 oz (81.239 kg)     Past Medical History  Diagnosis Date  . Hepatitis C   . Liver cirrhosis   . Shingles   . Nephrolithiasis   . Skin cancer   . Atrial flutter     09/2012 s/p TEE/DCCV  . GERD (gastroesophageal reflux disease)   . HCC (hepatocellular carcinoma)     s/p ablation, with extension to Abdominal/chest wall  . History of tobacco abuse     quit 1972    Current Outpatient Prescriptions  Medication Sig Dispense Refill  . metoprolol tartrate (LOPRESSOR) 25 MG tablet Take 1 tablet (25 mg total) by mouth 2 (two) times daily.  60 tablet  6  . warfarin (COUMADIN) 5 MG tablet Take 1 1/2 tablets (7.5mg )  tonight (2/21) then take 1 tablet (5mg ) on Saturday (2/22) and Sunday (2/23) evening.  60 tablet  3   No current facility-administered medications for this visit.    Allergies:    Allergies  Allergen Reactions  . Penicillins     Social History:  The patient  reports that he quit smoking about 42 years ago. His smoking use included Cigarettes. He smoked 0.00 packs per day. He does not have any smokeless tobacco history on file. He reports that he drinks about 1.8 ounces of alcohol per week. He reports that he does not use illicit drugs.   ROS:  Please see the history of present illness.   No bleeding problems.   All other systems reviewed and negative.   PHYSICAL EXAM: VS:  BP 134/70  Pulse 54  Ht 5\' 8"  (1.727 m)  Wt 181 lb (82.101 kg)  BMI 27.53 kg/m2 Well nourished, well developed, in no acute distress HEENT: normal Neck: no JVD Cardiac:  normal S1, S2; RRR; no murmur Lungs:  clear to auscultation bilaterally, no wheezing, rhonchi or rales Abd: soft, nontender, no hepatomegaly Ext: no edema Skin: warm and dry Neuro:  CNs 2-12 intact, no focal abnormalities noted  EKG:  Sinus brady, HR 54     ASSESSMENT AND PLAN:  1. Atrial Fibrillation/Flutter:  Maintaining NSR.  He will  continue close follow up in our coumadin clinic.  Plan a repeat CBC next week.  Suspect he will come off of coumadin in the next 4-6 weeks.  Will reassess at follow up. 2. Hepatocellular CA:  Follow up with oncology at [facility]. 3. Thrombocytopenia:  Plan follow up CBC again next week. 4. Coronary Artery Disease:  Calcification noted on chest CT.  No angina.  Consider starting ASA once he is off coumadin. 5. Dilated Thoracic Aorta:  Consider follow up CT in 6-12 mos depending on prognosis of HCC. 6. Disposition:  Follow up with Dr. Dietrich Pates or me in 4 weeks.   Signed, Tereso Newcomer, PA-C  5:30 PM 10/17/2012

## 2012-10-18 DIAGNOSIS — K7460 Unspecified cirrhosis of liver: Secondary | ICD-10-CM | POA: Diagnosis not present

## 2012-10-18 DIAGNOSIS — B1920 Unspecified viral hepatitis C without hepatic coma: Secondary | ICD-10-CM | POA: Diagnosis not present

## 2012-10-22 ENCOUNTER — Ambulatory Visit (INDEPENDENT_AMBULATORY_CARE_PROVIDER_SITE_OTHER): Payer: Medicare Other | Admitting: *Deleted

## 2012-10-22 DIAGNOSIS — B1920 Unspecified viral hepatitis C without hepatic coma: Secondary | ICD-10-CM

## 2012-10-22 DIAGNOSIS — I4891 Unspecified atrial fibrillation: Secondary | ICD-10-CM | POA: Diagnosis not present

## 2012-10-23 DIAGNOSIS — C228 Malignant neoplasm of liver, primary, unspecified as to type: Secondary | ICD-10-CM | POA: Diagnosis not present

## 2012-10-24 DIAGNOSIS — B1920 Unspecified viral hepatitis C without hepatic coma: Secondary | ICD-10-CM | POA: Diagnosis not present

## 2012-10-24 DIAGNOSIS — K7460 Unspecified cirrhosis of liver: Secondary | ICD-10-CM | POA: Diagnosis not present

## 2012-10-24 DIAGNOSIS — C228 Malignant neoplasm of liver, primary, unspecified as to type: Secondary | ICD-10-CM | POA: Diagnosis not present

## 2012-10-25 DIAGNOSIS — Z510 Encounter for antineoplastic radiation therapy: Secondary | ICD-10-CM | POA: Diagnosis not present

## 2012-10-26 ENCOUNTER — Ambulatory Visit (INDEPENDENT_AMBULATORY_CARE_PROVIDER_SITE_OTHER): Payer: Medicare Other | Admitting: *Deleted

## 2012-10-26 ENCOUNTER — Other Ambulatory Visit (INDEPENDENT_AMBULATORY_CARE_PROVIDER_SITE_OTHER): Payer: Medicare Other

## 2012-10-26 DIAGNOSIS — I4891 Unspecified atrial fibrillation: Secondary | ICD-10-CM | POA: Diagnosis not present

## 2012-10-26 LAB — CBC WITH DIFFERENTIAL/PLATELET
Basophils Relative: 0 % (ref 0.0–3.0)
Eosinophils Absolute: 0.1 10*3/uL (ref 0.0–0.7)
HCT: 40 % (ref 39.0–52.0)
Hemoglobin: 13.9 g/dL (ref 13.0–17.0)
Lymphs Abs: 1.3 10*3/uL (ref 0.7–4.0)
MCHC: 34.9 g/dL (ref 30.0–36.0)
MCV: 98.2 fl (ref 78.0–100.0)
Monocytes Absolute: 0.4 10*3/uL (ref 0.1–1.0)
Neutro Abs: 2.6 10*3/uL (ref 1.4–7.7)
Neutrophils Relative %: 59.2 % (ref 43.0–77.0)
RBC: 4.07 Mil/uL — ABNORMAL LOW (ref 4.22–5.81)

## 2012-10-29 ENCOUNTER — Telehealth: Payer: Self-pay | Admitting: *Deleted

## 2012-10-29 NOTE — Telephone Encounter (Signed)
no answer, I will try again later  

## 2012-10-29 NOTE — Telephone Encounter (Signed)
Message copied by Tarri Fuller on Mon Oct 29, 2012  9:36 AM ------      Message from: [place], [place] T      Created: Sun Oct 28, 2012  8:02 PM       Hgb and Plt counts stable.      Continue with current treatment plan.      Tereso Newcomer, PA-C  8:02 PM 10/28/2012          ------

## 2012-10-29 NOTE — Telephone Encounter (Signed)
pt's wife notified about lab results with verbal understanding 

## 2012-10-31 ENCOUNTER — Ambulatory Visit (INDEPENDENT_AMBULATORY_CARE_PROVIDER_SITE_OTHER): Payer: Medicare Other | Admitting: *Deleted

## 2012-10-31 DIAGNOSIS — Z7901 Long term (current) use of anticoagulants: Secondary | ICD-10-CM

## 2012-10-31 DIAGNOSIS — Z510 Encounter for antineoplastic radiation therapy: Secondary | ICD-10-CM | POA: Diagnosis not present

## 2012-10-31 DIAGNOSIS — B1920 Unspecified viral hepatitis C without hepatic coma: Secondary | ICD-10-CM

## 2012-10-31 DIAGNOSIS — I4891 Unspecified atrial fibrillation: Secondary | ICD-10-CM

## 2012-11-01 DIAGNOSIS — B1920 Unspecified viral hepatitis C without hepatic coma: Secondary | ICD-10-CM | POA: Diagnosis not present

## 2012-11-01 DIAGNOSIS — C50919 Malignant neoplasm of unspecified site of unspecified female breast: Secondary | ICD-10-CM | POA: Diagnosis not present

## 2012-11-02 DIAGNOSIS — C228 Malignant neoplasm of liver, primary, unspecified as to type: Secondary | ICD-10-CM | POA: Diagnosis not present

## 2012-11-02 DIAGNOSIS — B1920 Unspecified viral hepatitis C without hepatic coma: Secondary | ICD-10-CM | POA: Diagnosis not present

## 2012-11-02 DIAGNOSIS — K7460 Unspecified cirrhosis of liver: Secondary | ICD-10-CM | POA: Diagnosis not present

## 2012-11-02 DIAGNOSIS — Z510 Encounter for antineoplastic radiation therapy: Secondary | ICD-10-CM | POA: Diagnosis not present

## 2012-11-05 DIAGNOSIS — Z510 Encounter for antineoplastic radiation therapy: Secondary | ICD-10-CM | POA: Diagnosis not present

## 2012-11-05 DIAGNOSIS — C228 Malignant neoplasm of liver, primary, unspecified as to type: Secondary | ICD-10-CM | POA: Diagnosis not present

## 2012-11-05 DIAGNOSIS — B1920 Unspecified viral hepatitis C without hepatic coma: Secondary | ICD-10-CM | POA: Diagnosis not present

## 2012-11-05 DIAGNOSIS — K7460 Unspecified cirrhosis of liver: Secondary | ICD-10-CM | POA: Diagnosis not present

## 2012-11-06 DIAGNOSIS — B1920 Unspecified viral hepatitis C without hepatic coma: Secondary | ICD-10-CM | POA: Diagnosis not present

## 2012-11-06 DIAGNOSIS — C779 Secondary and unspecified malignant neoplasm of lymph node, unspecified: Secondary | ICD-10-CM | POA: Diagnosis not present

## 2012-11-06 DIAGNOSIS — C228 Malignant neoplasm of liver, primary, unspecified as to type: Secondary | ICD-10-CM | POA: Diagnosis not present

## 2012-11-06 DIAGNOSIS — K7460 Unspecified cirrhosis of liver: Secondary | ICD-10-CM | POA: Diagnosis not present

## 2012-11-06 DIAGNOSIS — C50919 Malignant neoplasm of unspecified site of unspecified female breast: Secondary | ICD-10-CM | POA: Diagnosis not present

## 2012-11-06 DIAGNOSIS — Z510 Encounter for antineoplastic radiation therapy: Secondary | ICD-10-CM | POA: Diagnosis not present

## 2012-11-07 ENCOUNTER — Ambulatory Visit (INDEPENDENT_AMBULATORY_CARE_PROVIDER_SITE_OTHER): Payer: Medicare Other | Admitting: *Deleted

## 2012-11-07 DIAGNOSIS — I4891 Unspecified atrial fibrillation: Secondary | ICD-10-CM

## 2012-11-07 DIAGNOSIS — Z7901 Long term (current) use of anticoagulants: Secondary | ICD-10-CM

## 2012-11-07 DIAGNOSIS — B1920 Unspecified viral hepatitis C without hepatic coma: Secondary | ICD-10-CM

## 2012-11-14 ENCOUNTER — Ambulatory Visit (INDEPENDENT_AMBULATORY_CARE_PROVIDER_SITE_OTHER): Payer: Medicare Other | Admitting: *Deleted

## 2012-11-14 DIAGNOSIS — Z7901 Long term (current) use of anticoagulants: Secondary | ICD-10-CM

## 2012-11-14 DIAGNOSIS — I4891 Unspecified atrial fibrillation: Secondary | ICD-10-CM

## 2012-11-14 DIAGNOSIS — B1920 Unspecified viral hepatitis C without hepatic coma: Secondary | ICD-10-CM

## 2012-11-20 DIAGNOSIS — S81809A Unspecified open wound, unspecified lower leg, initial encounter: Secondary | ICD-10-CM | POA: Diagnosis not present

## 2012-11-23 ENCOUNTER — Encounter: Payer: Self-pay | Admitting: Physician Assistant

## 2012-11-23 ENCOUNTER — Ambulatory Visit (INDEPENDENT_AMBULATORY_CARE_PROVIDER_SITE_OTHER): Payer: Medicare Other | Admitting: Pharmacist

## 2012-11-23 ENCOUNTER — Ambulatory Visit (INDEPENDENT_AMBULATORY_CARE_PROVIDER_SITE_OTHER): Payer: Medicare Other | Admitting: Physician Assistant

## 2012-11-23 VITALS — BP 142/64 | HR 56 | Ht 68.0 in | Wt 183.8 lb

## 2012-11-23 DIAGNOSIS — C220 Liver cell carcinoma: Secondary | ICD-10-CM

## 2012-11-23 DIAGNOSIS — Z7901 Long term (current) use of anticoagulants: Secondary | ICD-10-CM | POA: Diagnosis not present

## 2012-11-23 DIAGNOSIS — B1920 Unspecified viral hepatitis C without hepatic coma: Secondary | ICD-10-CM

## 2012-11-23 DIAGNOSIS — I712 Thoracic aortic aneurysm, without rupture: Secondary | ICD-10-CM

## 2012-11-23 DIAGNOSIS — I2510 Atherosclerotic heart disease of native coronary artery without angina pectoris: Secondary | ICD-10-CM | POA: Diagnosis not present

## 2012-11-23 DIAGNOSIS — S81809A Unspecified open wound, unspecified lower leg, initial encounter: Secondary | ICD-10-CM | POA: Diagnosis not present

## 2012-11-23 DIAGNOSIS — C228 Malignant neoplasm of liver, primary, unspecified as to type: Secondary | ICD-10-CM

## 2012-11-23 DIAGNOSIS — I4891 Unspecified atrial fibrillation: Secondary | ICD-10-CM

## 2012-11-23 DIAGNOSIS — D696 Thrombocytopenia, unspecified: Secondary | ICD-10-CM | POA: Diagnosis not present

## 2012-11-23 LAB — CBC WITH DIFFERENTIAL/PLATELET
Basophils Relative: 0.3 % (ref 0.0–3.0)
Eosinophils Absolute: 0.2 10*3/uL (ref 0.0–0.7)
Eosinophils Relative: 3.7 % (ref 0.0–5.0)
HCT: 37.9 % — ABNORMAL LOW (ref 39.0–52.0)
Hemoglobin: 12.9 g/dL — ABNORMAL LOW (ref 13.0–17.0)
MCHC: 34.1 g/dL (ref 30.0–36.0)
MCV: 99.3 fl (ref 78.0–100.0)
Monocytes Absolute: 0.4 10*3/uL (ref 0.1–1.0)
Neutro Abs: 2.5 10*3/uL (ref 1.4–7.7)
RBC: 3.81 Mil/uL — ABNORMAL LOW (ref 4.22–5.81)

## 2012-11-23 NOTE — Patient Instructions (Addendum)
LAB TODAY; CBC W/DIFF  REPEAT CBC W/DIFF IN 1 MONTH  PLEASE FOLLOW UP WITH DR. ROSS IN 3 MONTHS  NO CHANGES WITH MEDICATIONS TODAY

## 2012-11-23 NOTE — Progress Notes (Signed)
[address] [place], [place]  [address] Phone: [phone], Fax:  [phone]  Date:  11/23/2012   ID:  Lee Allen, DOB 1925/01/27, MRN [id number]  PCP:  Abigail Miyamoto, MD  Primary Cardiologist:  Dr. Dietrich Pates     History of Present Illness: Lee Allen is a 79 y.o. male who presents for follow up on AFib.  He has a hx of HCV, hepatocellular CA, s/p ablation with seeding of thoracic wall with tumor (pending radiation), cirrhosis, thrombocytopenia.  He was admitted 2/19-2/24 with AFib/Flutter with RVR.  Chest CT was negative for PE. He does have coronary calcification and dilated ascending aorta (4 cm).  He was placed on heparin/coumadin and underwent TEE-DCCV restoring NSR.  TEE 10/04/12:  EF 60-65%, mild to mod MR, mild LAE.  Patient is now followed closely in our coumadin clinic.  He requires close follow up on his Hgb and PLT due to his thrombocytopenia from [facility].  I saw him last month and his Hgb and PLT count were stable and he was still in NSR.  Since last being seen, he is doing well. He denies chest pain, shortness of breath, syncope, orthopnea, PND or pedal edema. Denies palpitations.  Labs (2/14):  WBC 3.7, Hgb 13.7, PLT 92K, K 3.8, creatinine 0.95, ALT 129, TSH 4.506 Labs (3/14):  Hgb 13.9, PLT 88K   Wt Readings from Last 3 Encounters:  11/23/12 183 lb 12.8 oz (83.371 kg)  10/17/12 181 lb (82.101 kg)  10/05/12 179 lb 1.6 oz (81.239 kg)     Past Medical History  Diagnosis Date  . Hepatitis C   . Liver cirrhosis   . Shingles   . Nephrolithiasis   . Skin cancer   . Atrial flutter     09/2012 s/p TEE/DCCV  . GERD (gastroesophageal reflux disease)   . HCC (hepatocellular carcinoma)     s/p ablation, with extension to Abdominal/chest wall  . History of tobacco abuse     quit 1972    Current Outpatient Prescriptions  Medication Sig Dispense Refill  . metoprolol tartrate (LOPRESSOR) 25 MG tablet Take 1 tablet (25 mg total) by mouth  2 (two) times daily.  60 tablet  6  . warfarin (COUMADIN) 5 MG tablet Take 1 1/2 tablets (7.5mg ) tonight (2/21) then take 1 tablet (5mg ) on Saturday (2/22) and Sunday (2/23) evening.  60 tablet  3   No current facility-administered medications for this visit.    Allergies:    Allergies  Allergen Reactions  . Penicillins     Social History:  The patient  reports that he quit smoking about 42 years ago. His smoking use included Cigarettes. He smoked 0.00 packs per day. He does not have any smokeless tobacco history on file. He reports that he drinks about 1.8 ounces of alcohol per week. He reports that he does not use illicit drugs.   ROS:  Please see the history of present illness.  No bleeding problems.   All other systems reviewed and negative.   PHYSICAL EXAM: VS:  BP 142/64  Pulse 56  Ht 5\' 8"  (1.727 m)  Wt 183 lb 12.8 oz (83.371 kg)  BMI 27.95 kg/m2 Well nourished, well developed, in no acute distress HEENT: normal Neck: no JVD at 90 degrees Cardiac:  normal S1, S2; RRR; no murmur Lungs:  clear to auscultation bilaterally, no wheezing, rhonchi or rales Abd: soft, nontender, no hepatomegaly Ext: no edema Skin: warm and dry Neuro:  CNs 2-12  intact, no focal abnormalities noted  EKG:  Sinus bradycardia, HR 57, normal axis    ASSESSMENT AND PLAN:  1. Atrial Fibrillation/Flutter:  Maintaining sinus rhythm. He is tolerating Coumadin with stable hemoglobin and platelet count. I reviewed his case today with Dr. Tenny Craw. She prefers to keep him on Coumadin for now as long as he is tolerating it without any bleeding complications. A follow up CBC will be drawn today. Repeat CBC will be obtained in one month. 2. Hepatocellular CA:  Follow up with oncology at [facility]. 3. Thrombocytopenia:  Repeat CBC today.   4. Coronary Artery Disease:  Calcification noted on chest CT.  No angina.  No aspirin as he is on Coumadin. No statin with history of hepatocellular carcinoma. 5. Dilated Thoracic  Aorta:  Consider follow up CT in 6-12 mos depending on prognosis of HCC. 6. Disposition:  Follow up with Dr. Dietrich Pates in 3 months.   Signed, Tereso Newcomer, PA-C  12:18 PM 11/23/2012

## 2012-11-26 ENCOUNTER — Telehealth: Payer: Self-pay | Admitting: *Deleted

## 2012-11-26 DIAGNOSIS — Z7901 Long term (current) use of anticoagulants: Secondary | ICD-10-CM

## 2012-11-26 DIAGNOSIS — B182 Chronic viral hepatitis C: Secondary | ICD-10-CM

## 2012-11-26 NOTE — Telephone Encounter (Signed)
s/w pt's wife on the home # , she is aware of results; she asked for me to call pt on his mobile. lmptcb to go over results and repeat cbc

## 2012-11-26 NOTE — Telephone Encounter (Signed)
Message copied by Tarri Fuller on Mon Nov 26, 2012  2:30 PM ------      Message from: [place], [place] T      Created: Mon Nov 26, 2012  1:53 PM       Hgb and PLT count slightly lower.      Repeat CBC in 1 week      Tereso Newcomer, PA-C  1:53 PM 11/26/2012 ------

## 2012-11-27 NOTE — Telephone Encounter (Signed)
Spoke with patient and reviewed lab results and order for repeat CBC 12/03/12.  Patient verbalized understanding.

## 2012-11-27 NOTE — Telephone Encounter (Signed)
New Problem:    Patient called in returning Carol's call.  Please call back.

## 2012-11-28 DIAGNOSIS — S81809A Unspecified open wound, unspecified lower leg, initial encounter: Secondary | ICD-10-CM | POA: Diagnosis not present

## 2012-12-03 ENCOUNTER — Other Ambulatory Visit (INDEPENDENT_AMBULATORY_CARE_PROVIDER_SITE_OTHER): Payer: Medicare Other

## 2012-12-03 DIAGNOSIS — Z7901 Long term (current) use of anticoagulants: Secondary | ICD-10-CM

## 2012-12-03 DIAGNOSIS — S81809A Unspecified open wound, unspecified lower leg, initial encounter: Secondary | ICD-10-CM | POA: Diagnosis not present

## 2012-12-03 LAB — CBC WITH DIFFERENTIAL/PLATELET
Basophils Absolute: 0 10*3/uL (ref 0.0–0.1)
Eosinophils Absolute: 0.1 10*3/uL (ref 0.0–0.7)
Lymphocytes Relative: 28.9 % (ref 12.0–46.0)
MCHC: 35 g/dL (ref 30.0–36.0)
MCV: 98.3 fl (ref 78.0–100.0)
Monocytes Absolute: 0.3 10*3/uL (ref 0.1–1.0)
Neutro Abs: 1.9 10*3/uL (ref 1.4–7.7)
Neutrophils Relative %: 57.9 % (ref 43.0–77.0)
RDW: 14.4 % (ref 11.5–14.6)

## 2012-12-04 ENCOUNTER — Telehealth: Payer: Self-pay | Admitting: *Deleted

## 2012-12-04 NOTE — Telephone Encounter (Signed)
Message copied by Tarri Fuller on Tue Dec 04, 2012  9:32 AM ------      Message from: [place], [place] T      Created: Mon Dec 03, 2012  8:25 PM       Hgb and PLT count stable      Repeat CBC in 4 weeks      Tereso Newcomer, PA-C  8:25 PM 12/03/2012 ------

## 2012-12-04 NOTE — Telephone Encounter (Signed)
pt's wife notified about lab results and repeat cbc 5/9 @ [facility] appt. wife verbalized understanding to me today

## 2012-12-10 DIAGNOSIS — S81809A Unspecified open wound, unspecified lower leg, initial encounter: Secondary | ICD-10-CM | POA: Diagnosis not present

## 2012-12-10 DIAGNOSIS — H2510 Age-related nuclear cataract, unspecified eye: Secondary | ICD-10-CM | POA: Diagnosis not present

## 2012-12-21 ENCOUNTER — Other Ambulatory Visit (INDEPENDENT_AMBULATORY_CARE_PROVIDER_SITE_OTHER): Payer: Medicare Other

## 2012-12-21 ENCOUNTER — Ambulatory Visit (INDEPENDENT_AMBULATORY_CARE_PROVIDER_SITE_OTHER): Payer: Medicare Other

## 2012-12-21 DIAGNOSIS — I4891 Unspecified atrial fibrillation: Secondary | ICD-10-CM

## 2012-12-21 DIAGNOSIS — B1920 Unspecified viral hepatitis C without hepatic coma: Secondary | ICD-10-CM | POA: Diagnosis not present

## 2012-12-21 DIAGNOSIS — Z7901 Long term (current) use of anticoagulants: Secondary | ICD-10-CM | POA: Diagnosis not present

## 2012-12-21 LAB — CBC WITH DIFFERENTIAL/PLATELET
Basophils Absolute: 0 10*3/uL (ref 0.0–0.1)
Basophils Relative: 0.5 % (ref 0.0–3.0)
Eosinophils Absolute: 0.1 10*3/uL (ref 0.0–0.7)
Lymphocytes Relative: 34 % (ref 12.0–46.0)
MCHC: 34.6 g/dL (ref 30.0–36.0)
MCV: 99.9 fl (ref 78.0–100.0)
Monocytes Absolute: 0.3 10*3/uL (ref 0.1–1.0)
Neutrophils Relative %: 53.5 % (ref 43.0–77.0)
Platelets: 80 10*3/uL — ABNORMAL LOW (ref 150.0–400.0)
RBC: 3.68 Mil/uL — ABNORMAL LOW (ref 4.22–5.81)

## 2012-12-25 ENCOUNTER — Other Ambulatory Visit: Payer: Self-pay | Admitting: *Deleted

## 2012-12-25 DIAGNOSIS — Z7901 Long term (current) use of anticoagulants: Secondary | ICD-10-CM

## 2013-01-04 ENCOUNTER — Ambulatory Visit (INDEPENDENT_AMBULATORY_CARE_PROVIDER_SITE_OTHER): Payer: Medicare Other | Admitting: *Deleted

## 2013-01-04 DIAGNOSIS — I4891 Unspecified atrial fibrillation: Secondary | ICD-10-CM

## 2013-01-04 DIAGNOSIS — Z7901 Long term (current) use of anticoagulants: Secondary | ICD-10-CM

## 2013-01-04 DIAGNOSIS — B1920 Unspecified viral hepatitis C without hepatic coma: Secondary | ICD-10-CM

## 2013-01-04 LAB — POCT INR: INR: 2.9

## 2013-01-25 ENCOUNTER — Ambulatory Visit (INDEPENDENT_AMBULATORY_CARE_PROVIDER_SITE_OTHER): Payer: Medicare Other

## 2013-01-25 DIAGNOSIS — Z7901 Long term (current) use of anticoagulants: Secondary | ICD-10-CM

## 2013-01-25 DIAGNOSIS — I4891 Unspecified atrial fibrillation: Secondary | ICD-10-CM

## 2013-01-25 DIAGNOSIS — B1920 Unspecified viral hepatitis C without hepatic coma: Secondary | ICD-10-CM

## 2013-01-25 LAB — POCT INR: INR: 3

## 2013-02-05 ENCOUNTER — Other Ambulatory Visit: Payer: Medicare Other

## 2013-02-13 ENCOUNTER — Ambulatory Visit (INDEPENDENT_AMBULATORY_CARE_PROVIDER_SITE_OTHER): Payer: Medicare Other | Admitting: *Deleted

## 2013-02-13 DIAGNOSIS — B1920 Unspecified viral hepatitis C without hepatic coma: Secondary | ICD-10-CM

## 2013-02-13 DIAGNOSIS — I4891 Unspecified atrial fibrillation: Secondary | ICD-10-CM

## 2013-02-13 DIAGNOSIS — Z7901 Long term (current) use of anticoagulants: Secondary | ICD-10-CM

## 2013-02-13 LAB — POCT INR: INR: 1.8

## 2013-03-06 ENCOUNTER — Ambulatory Visit (INDEPENDENT_AMBULATORY_CARE_PROVIDER_SITE_OTHER): Payer: Medicare Other | Admitting: *Deleted

## 2013-03-06 ENCOUNTER — Encounter: Payer: Self-pay | Admitting: Cardiology

## 2013-03-06 ENCOUNTER — Ambulatory Visit (INDEPENDENT_AMBULATORY_CARE_PROVIDER_SITE_OTHER): Payer: Medicare Other | Admitting: Cardiology

## 2013-03-06 VITALS — BP 128/80 | HR 120 | Ht 68.0 in | Wt 180.4 lb

## 2013-03-06 DIAGNOSIS — I499 Cardiac arrhythmia, unspecified: Secondary | ICD-10-CM

## 2013-03-06 DIAGNOSIS — B1920 Unspecified viral hepatitis C without hepatic coma: Secondary | ICD-10-CM

## 2013-03-06 DIAGNOSIS — Z7901 Long term (current) use of anticoagulants: Secondary | ICD-10-CM

## 2013-03-06 DIAGNOSIS — I4891 Unspecified atrial fibrillation: Secondary | ICD-10-CM

## 2013-03-06 DIAGNOSIS — B182 Chronic viral hepatitis C: Secondary | ICD-10-CM

## 2013-03-06 DIAGNOSIS — K7460 Unspecified cirrhosis of liver: Secondary | ICD-10-CM

## 2013-03-06 NOTE — Progress Notes (Signed)
[address], [address] [place], [place]  [address] Phone: [phone], Fax:  [phone]  Date:  03/06/2013   ID:  Lee Allen, DOB 06-09-1925, MRN [id number]  PCP:  Abigail Miyamoto, MD  Primary Cardiologist:  Dr. Dietrich Pates     History of Present Illness: Lee Allen is a 79 y.o. male who is seen as a work in for evaluation of recurrent atrial flutter..  He has a hx of HCV, hepatocellular CA, s/p ablation with seeding of thoracic wall with tumor (pending radiation), cirrhosis, thrombocytopenia.  He was admitted 2/19-2/24 with AFib/Flutter with RVR.  Chest CT was negative for PE. He does have coronary calcification and dilated ascending aorta (4 cm).  He was placed on heparin/coumadin and underwent TEE-DCCV restoring NSR.  TEE 10/04/12:  EF 60-65%, mild to mod MR, mild LAE.  Patient is now followed closely in our coumadin clinic.  He requires close follow up on his Hgb and PLT due to his thrombocytopenia from [facility].  When seen in April he was still in normal sinus rhythm. Today he was seen in the Coumadin clinic and his pulse was noted to be rapid. He states that his heart rate has been elevated for the past 10 days. For the past 3 days he didn't take his metoprolol. Today his pulse rate was 120 and ECG confirmed that he was back in atrial flutter. Other than noting his elevated heart rate he is really asymptomatic. He denies any dyspnea, chest pain, or edema. He has no significant palpitations or dizziness. 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 Unfortunately his last 2 INRs were subtherapeutic.  Wt Readings from Last 3 Encounters:  03/06/13 180 lb 6 oz (81.818 kg)  11/23/12 183 lb 12.8 oz (83.371 kg)  10/17/12 181 lb (82.101 kg)     Past Medical History  Diagnosis Date  . Hepatitis C   . Liver cirrhosis   . Shingles   . Nephrolithiasis   . Skin cancer   . Atrial flutter     09/2012 s/p TEE/DCCV   . GERD (gastroesophageal reflux disease)   . HCC (hepatocellular carcinoma)     s/p ablation, with extension to Abdominal/chest wall  . History of tobacco abuse     quit 1972  . CAD (coronary artery disease)     coronary calcification by chest CT 09/2012  . Ascending aorta dilation     4 cm by chest CT 2/14    Current Outpatient Prescriptions  Medication Sig Dispense Refill  . metoprolol tartrate (LOPRESSOR) 25 MG tablet Take 1 tablet (25 mg total) by mouth 2 (two) times daily.  60 tablet  6  . warfarin (COUMADIN) 5 MG tablet Take 1 1/2 tablets (7.5mg ) tonight (2/21) then take 1 tablet (5mg ) on Saturday (2/22) and Sunday (2/23) evening.  60 tablet  3   No current facility-administered medications for this visit.    Allergies:    Allergies  Allergen Reactions  . Penicillins     Social History:  The patient  reports that he quit smoking about 42 years ago. His smoking use included Cigarettes. He smoked 0.00 packs per day. He does not have any smokeless tobacco history on file. He reports that he drinks about 1.8 ounces of alcohol per week. He reports that he does not use illicit drugs.   ROS:  Please see the history of present illness.  No bleeding problems.  All other systems reviewed and negative.   PHYSICAL EXAM: VS:  BP 128/80  Pulse 120  Ht 5\' 8"  (1.727 m)  Wt 180 lb 6 oz (81.818 kg)  BMI 27.43 kg/m2 Well nourished, well developed, in no acute distress HEENT: normal Neck: no JVD at 90 degrees Cardiac:  normal S1, S2; RRR; no murmur Lungs:  clear to auscultation bilaterally, no wheezing, rhonchi or rales Abd: soft, nontender, no hepatomegaly Ext: no edema Skin: warm and dry Neuro:  CNs 2-12 intact, no focal abnormalities noted  EKG:  Atrial flutter with a ventricular response for 120 beats per minute. He has an incomplete right bundle branch block and nonspecific ST abnormality.  ASSESSMENT AND PLAN:  1. Atrial Flutter:  Recurrent. Elevated ventricular response  due to noncompliance with his metoprolol. I recommended resuming metoprolol 50 mg in the morning and 25 mg in the evening. His Coumadin was adjusted by our Coumadin clinic. Since he is asymptomatic I would favor waiting until his INR is therapeutic to consider repeat DC cardioversion. We will have his INRs checked weekly. Once he has been therapeutic for 3-4 weeks we can consider repeating a cardioversion without TEE guidance. If he becomes more symptomatic we could move the date up but he would need a TEE at that time. We discussed the possibility of antiarrhythmic drug therapy but given his hepatic dysfunction his options are limited and he would need to be admitted for antiarrhythmic therapy. He might be a candidate for atrial flutter ablation in the future. 2. Hepatocellular CA:  Follow up with oncology at [facility]. 3. Thrombocytopenia:  Platelet count was 80,002 months ago. 4. Coronary Artery Disease:  Calcification noted on chest CT.  No angina.  No aspirin as he is on Coumadin. No statin with history of hepatocellular carcinoma. 5. Dilated Thoracic Aorta:  Consider follow up CT in 6-12 mos depending on prognosis of HCC. 6. Disposition:  Follow up with Dr. Dietrich Pates in 3 weeks

## 2013-03-06 NOTE — Patient Instructions (Signed)
Increase metoprolol to 50 mg in the morning and 25 mg in the evening.  Change coumadin dose as per the coumadin clinic.  We will check your coumadin in one week and have you see Dr. Tenny Craw in 3 weeks.

## 2013-03-12 DIAGNOSIS — C228 Malignant neoplasm of liver, primary, unspecified as to type: Secondary | ICD-10-CM | POA: Diagnosis not present

## 2013-03-14 ENCOUNTER — Ambulatory Visit (INDEPENDENT_AMBULATORY_CARE_PROVIDER_SITE_OTHER): Payer: Medicare Other | Admitting: *Deleted

## 2013-03-14 DIAGNOSIS — I4891 Unspecified atrial fibrillation: Secondary | ICD-10-CM | POA: Diagnosis not present

## 2013-03-14 DIAGNOSIS — Z7901 Long term (current) use of anticoagulants: Secondary | ICD-10-CM

## 2013-03-14 DIAGNOSIS — B1920 Unspecified viral hepatitis C without hepatic coma: Secondary | ICD-10-CM

## 2013-03-17 ENCOUNTER — Other Ambulatory Visit (HOSPITAL_COMMUNITY): Payer: Self-pay | Admitting: Cardiology

## 2013-03-20 ENCOUNTER — Other Ambulatory Visit: Payer: Self-pay

## 2013-03-20 ENCOUNTER — Ambulatory Visit (INDEPENDENT_AMBULATORY_CARE_PROVIDER_SITE_OTHER): Payer: Medicare Other | Admitting: Pharmacist

## 2013-03-20 DIAGNOSIS — I4891 Unspecified atrial fibrillation: Secondary | ICD-10-CM | POA: Diagnosis not present

## 2013-03-20 DIAGNOSIS — Z7901 Long term (current) use of anticoagulants: Secondary | ICD-10-CM

## 2013-03-20 DIAGNOSIS — B1920 Unspecified viral hepatitis C without hepatic coma: Secondary | ICD-10-CM

## 2013-03-20 LAB — POCT INR: INR: 2.3

## 2013-03-29 ENCOUNTER — Encounter: Payer: Self-pay | Admitting: Internal Medicine

## 2013-03-29 ENCOUNTER — Ambulatory Visit (INDEPENDENT_AMBULATORY_CARE_PROVIDER_SITE_OTHER): Payer: Medicare Other | Admitting: Internal Medicine

## 2013-03-29 ENCOUNTER — Ambulatory Visit (INDEPENDENT_AMBULATORY_CARE_PROVIDER_SITE_OTHER): Payer: Medicare Other | Admitting: *Deleted

## 2013-03-29 VITALS — BP 128/72 | HR 62 | Ht 67.0 in | Wt 181.1 lb

## 2013-03-29 DIAGNOSIS — I4891 Unspecified atrial fibrillation: Secondary | ICD-10-CM | POA: Diagnosis not present

## 2013-03-29 DIAGNOSIS — B1920 Unspecified viral hepatitis C without hepatic coma: Secondary | ICD-10-CM | POA: Diagnosis not present

## 2013-03-29 DIAGNOSIS — I499 Cardiac arrhythmia, unspecified: Secondary | ICD-10-CM

## 2013-03-29 DIAGNOSIS — Z7901 Long term (current) use of anticoagulants: Secondary | ICD-10-CM

## 2013-03-29 LAB — CBC WITH DIFFERENTIAL/PLATELET
Basophils Absolute: 0 10*3/uL (ref 0.0–0.1)
Basophils Relative: 0.5 % (ref 0.0–3.0)
Eosinophils Absolute: 0.1 10*3/uL (ref 0.0–0.7)
HCT: 35.6 % — ABNORMAL LOW (ref 39.0–52.0)
Hemoglobin: 12.1 g/dL — ABNORMAL LOW (ref 13.0–17.0)
Lymphocytes Relative: 29.3 % (ref 12.0–46.0)
Lymphs Abs: 1.2 10*3/uL (ref 0.7–4.0)
MCHC: 34 g/dL (ref 30.0–36.0)
MCV: 93.9 fl (ref 78.0–100.0)
Monocytes Absolute: 0.5 10*3/uL (ref 0.1–1.0)
Neutro Abs: 2.2 10*3/uL (ref 1.4–7.7)
RBC: 3.79 Mil/uL — ABNORMAL LOW (ref 4.22–5.81)
RDW: 13.3 % (ref 11.5–14.6)

## 2013-03-29 MED ORDER — METOPROLOL SUCCINATE ER 50 MG PO TB24: 50.0000 mg | ORAL_TABLET | Freq: Every day | ORAL | Status: DC

## 2013-03-29 NOTE — Patient Instructions (Addendum)
**Note De-Identified  Obfuscation** Your physician has recommended you make the following change in your medication: stop taking Lopressor and start taking Toprol XL 50 mg daily  Your physician recommends that you return for lab work in: today  Your physician wants you to follow-up in: December. You will receive a reminder letter in the mail two months in advance. If you don't receive a letter, please call our office to schedule the follow-up appointment.

## 2013-04-04 NOTE — Progress Notes (Signed)
HPI Patient is an 79 yo male with a history of atrial flutter, HCV,  hepatocellular CA, s/p ablation with seeding of thoracic wall with tumor (pending radiation), cirrhosis, thrombocytopenia. He was admitted 2/19-2/24 with AFib/Flutter with RVR. Chest CT was negative for PE. He does have coronary calcification and dilated ascending aorta (4 cm). He was placed on heparin/coumadin and underwent TEE-DCCV restoring NSR. TEE 10/04/12: EF 60-65%, mild to mod MR, mild LAE. Patient is now followed closely in our coumadin clinic. He requires close follow up on his Hgb and PLT due to his thrombocytopenia from [facility].  In April he was still in SR He was in clinic in July for coumadin check Pulse was high  He stated that his heart rate had been elevated for the past 10 days.  He had not taken his metoprolol for 3 days  ECG confirmed that he was back in atrial flutter. Other than noting his elevated heart rate he is really asymptomatic. He denies any dyspnea, chest pain, or edema, no significant palpitations or dizziness. 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  IN July plan was to continue rate control and follow coumadin levels  Once therapeutic consider cardioversion  Today the patient continues to do activities as tolerated.  Denies SOB  No palpitations.    Allergies  Allergen Reactions  . Penicillins     Current Outpatient Prescriptions  Medication Sig Dispense Refill  . warfarin (COUMADIN) 5 MG tablet Take 1 tablet (5 mg total) by mouth as directed.  60 tablet  3  . metoprolol succinate (TOPROL-XL) 50 MG 24 hr tablet Take 1 tablet (50 mg total) by mouth daily. Take with or immediately following a meal.  90 tablet  1   No current facility-administered medications for this visit.    Past Medical History  Diagnosis Date  . Hepatitis C   . Liver cirrhosis   . Shingles   . Nephrolithiasis   . Skin cancer   . Atrial flutter     09/2012 s/p  TEE/DCCV  . GERD (gastroesophageal reflux disease)   . HCC (hepatocellular carcinoma)     s/p ablation, with extension to Abdominal/chest wall  . History of tobacco abuse     quit 1972  . CAD (coronary artery disease)     coronary calcification by chest CT 09/2012  . Ascending aorta dilation     4 cm by chest CT 2/14    Past Surgical History  Procedure Laterality Date  . Lithotripsy    . Skin cancer excision    . Ablation  2012    liver  . Tee without cardioversion N/A 10/04/2012    Procedure: TRANSESOPHAGEAL ECHOCARDIOGRAM (TEE);  Surgeon: Peter M Swaziland, MD;  Location: [facility] ENDOSCOPY;  Service: Cardiovascular;  Laterality: N/A;  . Cardioversion N/A 10/04/2012    Procedure: CARDIOVERSION;  Surgeon: Peter M Swaziland, MD;  Location: [facility] ENDOSCOPY;  Service: Cardiovascular;  Laterality: N/A;    Family History  Problem Relation Age of Onset  . Leukemia Father     History   Social History  . Marital Status: Married    Spouse Name: N/A    Number of Children: N/A  . Years of Education: N/A   Occupational History  . Not on file.   Social History Main Topics  . Smoking status: Former Smoker    Types: Cigarettes    Quit date: 08/15/1970  . Smokeless tobacco: Not on file  .  Alcohol Use: 1.8 oz/week    3 Cans of beer per week     Comment: occasionally  . Drug Use: No  . Sexual Activity: Not on file   Other Topics Concern  . Not on file   Social History Narrative  . No narrative on file    Review of Systems:  All systems reviewed.  They are negative to the above problem except as previously stated.  Vital Signs: BP 128/72  Pulse 62  Ht 5\' 7"  (1.702 m)  Wt 181 lb 1.9 oz (82.155 kg)  BMI 28.36 kg/m2  Physical Exam Patient is in NAD HEENT:  Normocephalic, atraumatic. EOMI, PERRLA.  Neck: JVP is normal.  No bruits.  Lungs: clear to auscultation. No rales no wheezes.  Heart: Irregular rate and rhythm. Normal S1, S2. No S3.   No significant murmurs. PMI not displaced.   Abdomen:  Supple, nontender. Normal bowel sounds. No masses. No hepatomegaly.  Extremities:   Good distal pulses throughout. No lower extremity edema.  Musculoskeletal :moving all extremities.  Neuro:   alert and oriented x3.  CN II-XII grossly intact.   Assessment and Plan:  1.  Atrial flutter.  Patient remains rel asymptomatic.  INR was subtherapeutic earlier He says he urinates at night because of his metoprolol. I would recomm continuing metoprolol  Would put on XL formulation and see how he tolerates He is back in atrial flutter after short term  I do not think cardioversion is indicated at present since he is asymptomatic  Question is plans for long term  Need to get records from [facility] re liver condition.  Anticoagulation is concerning. With liver issues antiarrhythmics are not a good option and again he is not that symptomatic.

## 2013-04-12 ENCOUNTER — Ambulatory Visit (INDEPENDENT_AMBULATORY_CARE_PROVIDER_SITE_OTHER): Payer: Medicare Other

## 2013-04-12 DIAGNOSIS — I4891 Unspecified atrial fibrillation: Secondary | ICD-10-CM | POA: Diagnosis not present

## 2013-04-12 DIAGNOSIS — B1920 Unspecified viral hepatitis C without hepatic coma: Secondary | ICD-10-CM

## 2013-04-12 DIAGNOSIS — Z7901 Long term (current) use of anticoagulants: Secondary | ICD-10-CM | POA: Diagnosis not present

## 2013-05-03 ENCOUNTER — Ambulatory Visit (INDEPENDENT_AMBULATORY_CARE_PROVIDER_SITE_OTHER): Payer: Medicare Other | Admitting: *Deleted

## 2013-05-03 DIAGNOSIS — I4891 Unspecified atrial fibrillation: Secondary | ICD-10-CM

## 2013-05-03 DIAGNOSIS — Z7901 Long term (current) use of anticoagulants: Secondary | ICD-10-CM | POA: Diagnosis not present

## 2013-05-03 DIAGNOSIS — B1920 Unspecified viral hepatitis C without hepatic coma: Secondary | ICD-10-CM

## 2013-05-15 DIAGNOSIS — L570 Actinic keratosis: Secondary | ICD-10-CM | POA: Diagnosis not present

## 2013-05-15 DIAGNOSIS — C44319 Basal cell carcinoma of skin of other parts of face: Secondary | ICD-10-CM | POA: Diagnosis not present

## 2013-06-03 ENCOUNTER — Ambulatory Visit (INDEPENDENT_AMBULATORY_CARE_PROVIDER_SITE_OTHER): Payer: Medicare Other | Admitting: Pharmacist

## 2013-06-03 DIAGNOSIS — B1920 Unspecified viral hepatitis C without hepatic coma: Secondary | ICD-10-CM | POA: Diagnosis not present

## 2013-06-03 DIAGNOSIS — Z7901 Long term (current) use of anticoagulants: Secondary | ICD-10-CM

## 2013-06-03 DIAGNOSIS — I4891 Unspecified atrial fibrillation: Secondary | ICD-10-CM

## 2013-06-03 LAB — POCT INR: INR: 2.5

## 2013-06-10 DIAGNOSIS — C228 Malignant neoplasm of liver, primary, unspecified as to type: Secondary | ICD-10-CM | POA: Diagnosis not present

## 2013-06-18 DIAGNOSIS — Z85828 Personal history of other malignant neoplasm of skin: Secondary | ICD-10-CM | POA: Diagnosis not present

## 2013-06-19 DIAGNOSIS — R300 Dysuria: Secondary | ICD-10-CM | POA: Diagnosis not present

## 2013-06-19 DIAGNOSIS — N390 Urinary tract infection, site not specified: Secondary | ICD-10-CM | POA: Diagnosis not present

## 2013-06-20 ENCOUNTER — Other Ambulatory Visit: Payer: Self-pay

## 2013-07-05 DIAGNOSIS — C228 Malignant neoplasm of liver, primary, unspecified as to type: Secondary | ICD-10-CM | POA: Diagnosis not present

## 2013-07-05 DIAGNOSIS — R161 Splenomegaly, not elsewhere classified: Secondary | ICD-10-CM | POA: Diagnosis not present

## 2013-07-05 DIAGNOSIS — K766 Portal hypertension: Secondary | ICD-10-CM | POA: Diagnosis not present

## 2013-07-15 ENCOUNTER — Ambulatory Visit (INDEPENDENT_AMBULATORY_CARE_PROVIDER_SITE_OTHER): Payer: Medicare Other | Admitting: Pharmacist

## 2013-07-15 DIAGNOSIS — Z7901 Long term (current) use of anticoagulants: Secondary | ICD-10-CM | POA: Diagnosis not present

## 2013-07-15 DIAGNOSIS — Z5181 Encounter for therapeutic drug level monitoring: Secondary | ICD-10-CM

## 2013-07-15 DIAGNOSIS — B1920 Unspecified viral hepatitis C without hepatic coma: Secondary | ICD-10-CM

## 2013-07-15 DIAGNOSIS — I4891 Unspecified atrial fibrillation: Secondary | ICD-10-CM

## 2013-08-05 ENCOUNTER — Ambulatory Visit (INDEPENDENT_AMBULATORY_CARE_PROVIDER_SITE_OTHER): Payer: Medicare Other | Admitting: Pharmacist

## 2013-08-05 DIAGNOSIS — I4891 Unspecified atrial fibrillation: Secondary | ICD-10-CM | POA: Diagnosis not present

## 2013-08-05 DIAGNOSIS — Z7901 Long term (current) use of anticoagulants: Secondary | ICD-10-CM | POA: Diagnosis not present

## 2013-08-05 DIAGNOSIS — B1920 Unspecified viral hepatitis C without hepatic coma: Secondary | ICD-10-CM | POA: Diagnosis not present

## 2013-08-19 DIAGNOSIS — E559 Vitamin D deficiency, unspecified: Secondary | ICD-10-CM | POA: Diagnosis not present

## 2013-08-19 DIAGNOSIS — Z139 Encounter for screening, unspecified: Secondary | ICD-10-CM | POA: Diagnosis not present

## 2013-08-19 DIAGNOSIS — Z0000 Encounter for general adult medical examination without abnormal findings: Secondary | ICD-10-CM | POA: Diagnosis not present

## 2013-08-19 DIAGNOSIS — Z1329 Encounter for screening for other suspected endocrine disorder: Secondary | ICD-10-CM | POA: Diagnosis not present

## 2013-08-19 DIAGNOSIS — R300 Dysuria: Secondary | ICD-10-CM | POA: Diagnosis not present

## 2013-08-19 DIAGNOSIS — L299 Pruritus, unspecified: Secondary | ICD-10-CM | POA: Diagnosis not present

## 2013-08-19 DIAGNOSIS — B354 Tinea corporis: Secondary | ICD-10-CM | POA: Diagnosis not present

## 2013-08-19 DIAGNOSIS — Z136 Encounter for screening for cardiovascular disorders: Secondary | ICD-10-CM | POA: Diagnosis not present

## 2013-08-19 DIAGNOSIS — Z131 Encounter for screening for diabetes mellitus: Secondary | ICD-10-CM | POA: Diagnosis not present

## 2013-08-30 DIAGNOSIS — J209 Acute bronchitis, unspecified: Secondary | ICD-10-CM | POA: Diagnosis not present

## 2013-09-02 ENCOUNTER — Ambulatory Visit (INDEPENDENT_AMBULATORY_CARE_PROVIDER_SITE_OTHER): Payer: Medicare Other | Admitting: Pharmacist

## 2013-09-02 ENCOUNTER — Ambulatory Visit (INDEPENDENT_AMBULATORY_CARE_PROVIDER_SITE_OTHER): Payer: Medicare Other | Admitting: Internal Medicine

## 2013-09-02 ENCOUNTER — Encounter: Payer: Self-pay | Admitting: Internal Medicine

## 2013-09-02 VITALS — BP 130/80 | HR 83 | Ht 67.0 in | Wt 178.0 lb

## 2013-09-02 DIAGNOSIS — I4891 Unspecified atrial fibrillation: Secondary | ICD-10-CM

## 2013-09-02 DIAGNOSIS — B1920 Unspecified viral hepatitis C without hepatic coma: Secondary | ICD-10-CM

## 2013-09-02 DIAGNOSIS — Z7901 Long term (current) use of anticoagulants: Secondary | ICD-10-CM

## 2013-09-02 DIAGNOSIS — I499 Cardiac arrhythmia, unspecified: Secondary | ICD-10-CM

## 2013-09-02 LAB — POCT INR: INR: 1.3

## 2013-09-02 MED ORDER — BETAMETHASONE DIPROPIONATE 0.05 % EX CREA: TOPICAL_CREAM | Freq: Two times a day (BID) | CUTANEOUS | Status: DC

## 2013-09-02 NOTE — Progress Notes (Signed)
HPI  HPI Patient is an 80 yo male with a history of atrial flutter, HCV,  hepatocellular CA, s/p ablation with seeding of thoracic wall with tumor (pending radiation), cirrhosis, thrombocytopenia. He was admitted 2/19-2/24 with AFib/Flutter with RVR. Chest CT was negative for PE. He does have coronary calcification and dilated ascending aorta (4 cm). He was placed on heparin/coumadin and underwent TEE-DCCV restoring NSR. TEE 10/04/12: EF 60-65%, mild to mod MR, mild LAE. Patient is now followed closely in our coumadin clinic. He requires close follow up on his Hgb and PLT due to his thrombocytopenia from [facility].  In April he was still in [place] in July since he did not take b blocker.  Plan for rate control since asympotmatic No palpitations.   No CP  Except with coughing.    Allergies  Allergen Reactions  . Penicillins     Current Outpatient Prescriptions  Medication Sig Dispense Refill  . doxycycline (MONODOX) 100 MG capsule Take 100 mg by mouth 2 (two) times daily.      Marland Kitchen ketoconazole (NIZORAL) 2 % cream       . metoprolol succinate (TOPROL-XL) 50 MG 24 hr tablet Take 1 tablet (50 mg total) by mouth daily. Take with or immediately following a meal.  90 tablet  1  . PROAIR HFA 108 (90 BASE) MCG/ACT inhaler       . warfarin (COUMADIN) 5 MG tablet Take 1 tablet (5 mg total) by mouth as directed.  60 tablet  3   No current facility-administered medications for this visit.    Past Medical History  Diagnosis Date  . Hepatitis C   . Liver cirrhosis   . Shingles   . Nephrolithiasis   . Skin cancer   . Atrial flutter     09/2012 s/p TEE/DCCV  . GERD (gastroesophageal reflux disease)   . [place] (hepatocellular carcinoma)     s/p ablation, with extension to Abdominal/chest wall  . History of tobacco abuse     quit 1972  . CAD (coronary artery disease)     coronary calcification by chest CT 09/2012  . Ascending aorta dilation     4 cm by chest CT 2/14    Past Surgical  History  Procedure Laterality Date  . Lithotripsy    . Skin cancer excision    . Ablation  2012    liver  . Tee without cardioversion N/A 10/04/2012    Procedure: TRANSESOPHAGEAL ECHOCARDIOGRAM (TEE);  Surgeon: Peter M Martinique, MD;  Location: [facility] ENDOSCOPY;  Service: Cardiovascular;  Laterality: N/A;  . Cardioversion N/A 10/04/2012    Procedure: CARDIOVERSION;  Surgeon: Peter M Martinique, MD;  Location: [facility] ENDOSCOPY;  Service: Cardiovascular;  Laterality: N/A;    Family History  Problem Relation Age of Onset  . Leukemia Father     History   Social History  . Marital Status: Married    Spouse Name: N/A    Number of Children: N/A  . Years of Education: N/A   Occupational History  . Not on file.   Social History Main Topics  . Smoking status: Former Smoker    Types: Cigarettes    Quit date: 08/15/1970  . Smokeless tobacco: Not on file  . Alcohol Use: 1.8 oz/week    3 Cans of beer per week     Comment: occasionally  . Drug Use: No  . Sexual Activity: Not on file   Other Topics Concern  . Not on file   Social History  Narrative  . No narrative on file    Review of Systems:  All systems reviewed.  They are negative to the above problem except as previously stated.  Vital Signs: BP 130/80  Pulse 83  Ht 5\' 7"  (1.702 m)  Wt 178 lb (80.74 kg)  BMI 27.87 kg/m2  Physical Exam Patient is in NAD HEENT:  Normocephalic, atraumatic. EOMI, PERRLA.  Neck: JVP is normal.  No bruits.  Lungs: clear to auscultation. No rales no wheezes.  Heart: Irregular rate and rhythm. Normal S1, S2. No S3.   No significant murmurs. PMI not displaced.  Abdomen:  Supple, nontender. Normal bowel sounds. No masses. No hepatomegaly.  Extremities:   Good distal pulses throughout. No lower extremity edema.  Musculoskeletal :moving all extremities.  Neuro:   alert and oriented x3.  CN II-XII grossly intact. Skin :  Eczema on stomach  EKG:  SR 83 bpm  PACs   Assessment and Plan:  1.  Atrial flutter.   In SR now. It appears that he goes in/out but has been rel symptom free Continue metoprolol and coumadin  2.  Hepatocellular CA  Followed at [facility]  Will follow blood counts  3.  Eczema:  Diprolene cream prescription sent in.  Told not to scratch

## 2013-09-02 NOTE — Patient Instructions (Signed)
Your physician wants you to follow-up in: 1 year  You will receive a reminder letter in the mail two months in advance. If you don't receive a letter, please call our office to schedule the follow-up appointment.  Your physician recommends that you continue on your current medications as directed. Please refer to the Current Medication list given to you today.  

## 2013-09-12 ENCOUNTER — Ambulatory Visit (INDEPENDENT_AMBULATORY_CARE_PROVIDER_SITE_OTHER): Payer: Medicare Other

## 2013-09-12 DIAGNOSIS — Z7901 Long term (current) use of anticoagulants: Secondary | ICD-10-CM

## 2013-09-12 DIAGNOSIS — B1920 Unspecified viral hepatitis C without hepatic coma: Secondary | ICD-10-CM

## 2013-09-12 DIAGNOSIS — I4891 Unspecified atrial fibrillation: Secondary | ICD-10-CM

## 2013-09-12 LAB — POCT INR: INR: 3.4

## 2013-09-16 DIAGNOSIS — I4891 Unspecified atrial fibrillation: Secondary | ICD-10-CM | POA: Diagnosis not present

## 2013-09-16 DIAGNOSIS — Z6828 Body mass index (BMI) 28.0-28.9, adult: Secondary | ICD-10-CM | POA: Diagnosis not present

## 2013-09-16 DIAGNOSIS — L508 Other urticaria: Secondary | ICD-10-CM | POA: Diagnosis not present

## 2013-09-16 DIAGNOSIS — Z7901 Long term (current) use of anticoagulants: Secondary | ICD-10-CM | POA: Diagnosis not present

## 2013-09-16 DIAGNOSIS — B1920 Unspecified viral hepatitis C without hepatic coma: Secondary | ICD-10-CM | POA: Diagnosis not present

## 2013-09-19 ENCOUNTER — Ambulatory Visit: Payer: Self-pay | Admitting: Cardiology

## 2013-09-19 DIAGNOSIS — Z7901 Long term (current) use of anticoagulants: Secondary | ICD-10-CM

## 2013-09-19 DIAGNOSIS — I4891 Unspecified atrial fibrillation: Secondary | ICD-10-CM

## 2013-09-19 DIAGNOSIS — B1920 Unspecified viral hepatitis C without hepatic coma: Secondary | ICD-10-CM

## 2013-10-16 DIAGNOSIS — Z7901 Long term (current) use of anticoagulants: Secondary | ICD-10-CM | POA: Diagnosis not present

## 2013-10-30 DIAGNOSIS — I4891 Unspecified atrial fibrillation: Secondary | ICD-10-CM | POA: Diagnosis not present

## 2013-11-06 DIAGNOSIS — D696 Thrombocytopenia, unspecified: Secondary | ICD-10-CM | POA: Diagnosis not present

## 2013-11-06 DIAGNOSIS — L299 Pruritus, unspecified: Secondary | ICD-10-CM | POA: Diagnosis not present

## 2013-11-06 DIAGNOSIS — C228 Malignant neoplasm of liver, primary, unspecified as to type: Secondary | ICD-10-CM | POA: Diagnosis not present

## 2013-11-06 DIAGNOSIS — K7460 Unspecified cirrhosis of liver: Secondary | ICD-10-CM | POA: Diagnosis not present

## 2013-11-06 DIAGNOSIS — I4892 Unspecified atrial flutter: Secondary | ICD-10-CM | POA: Diagnosis not present

## 2013-11-12 DIAGNOSIS — C228 Malignant neoplasm of liver, primary, unspecified as to type: Secondary | ICD-10-CM | POA: Diagnosis not present

## 2013-11-13 DIAGNOSIS — Z7901 Long term (current) use of anticoagulants: Secondary | ICD-10-CM | POA: Diagnosis not present

## 2013-11-15 ENCOUNTER — Emergency Department (HOSPITAL_COMMUNITY): Payer: Medicare Other

## 2013-11-15 ENCOUNTER — Inpatient Hospital Stay (HOSPITAL_COMMUNITY)
Admission: EM | Admit: 2013-11-15 | Discharge: 2013-11-21 | DRG: 981 | Disposition: A | Discharge status: Home Health | Payer: Medicare Other | Attending: General Surgery | Admitting: General Surgery

## 2013-11-15 ENCOUNTER — Inpatient Hospital Stay (HOSPITAL_COMMUNITY)
Admit: 2013-11-15 | Discharge: 2013-11-15 | Disposition: A | Discharge status: Home / Self Care | Payer: Medicare Other | Attending: General Surgery | Admitting: General Surgery

## 2013-11-15 ENCOUNTER — Encounter (HOSPITAL_COMMUNITY): Payer: Self-pay | Admitting: Emergency Medicine

## 2013-11-15 DIAGNOSIS — J9600 Acute respiratory failure, unspecified whether with hypoxia or hypercapnia: Secondary | ICD-10-CM | POA: Diagnosis present

## 2013-11-15 DIAGNOSIS — S99929A Unspecified injury of unspecified foot, initial encounter: Secondary | ICD-10-CM | POA: Diagnosis not present

## 2013-11-15 DIAGNOSIS — I4891 Unspecified atrial fibrillation: Secondary | ICD-10-CM | POA: Diagnosis present

## 2013-11-15 DIAGNOSIS — S99919A Unspecified injury of unspecified ankle, initial encounter: Secondary | ICD-10-CM | POA: Diagnosis not present

## 2013-11-15 DIAGNOSIS — I2510 Atherosclerotic heart disease of native coronary artery without angina pectoris: Secondary | ICD-10-CM | POA: Diagnosis present

## 2013-11-15 DIAGNOSIS — K7460 Unspecified cirrhosis of liver: Secondary | ICD-10-CM | POA: Diagnosis present

## 2013-11-15 DIAGNOSIS — I4892 Unspecified atrial flutter: Secondary | ICD-10-CM | POA: Diagnosis present

## 2013-11-15 DIAGNOSIS — R58 Hemorrhage, not elsewhere classified: Secondary | ICD-10-CM | POA: Diagnosis not present

## 2013-11-15 DIAGNOSIS — Z87891 Personal history of nicotine dependence: Secondary | ICD-10-CM

## 2013-11-15 DIAGNOSIS — S329XXA Fracture of unspecified parts of lumbosacral spine and pelvis, initial encounter for closed fracture: Secondary | ICD-10-CM

## 2013-11-15 DIAGNOSIS — S32509A Unspecified fracture of unspecified pubis, initial encounter for closed fracture: Principal | ICD-10-CM | POA: Diagnosis present

## 2013-11-15 DIAGNOSIS — D62 Acute posthemorrhagic anemia: Secondary | ICD-10-CM | POA: Diagnosis not present

## 2013-11-15 DIAGNOSIS — Z880 Allergy status to penicillin: Secondary | ICD-10-CM | POA: Diagnosis not present

## 2013-11-15 DIAGNOSIS — T794XXA Traumatic shock, initial encounter: Secondary | ICD-10-CM | POA: Diagnosis not present

## 2013-11-15 DIAGNOSIS — W64XXXA Exposure to other animate mechanical forces, initial encounter: Secondary | ICD-10-CM | POA: Diagnosis present

## 2013-11-15 DIAGNOSIS — S3991XA Unspecified injury of abdomen, initial encounter: Secondary | ICD-10-CM | POA: Diagnosis present

## 2013-11-15 DIAGNOSIS — M79609 Pain in unspecified limb: Secondary | ICD-10-CM | POA: Diagnosis not present

## 2013-11-15 DIAGNOSIS — K219 Gastro-esophageal reflux disease without esophagitis: Secondary | ICD-10-CM | POA: Diagnosis present

## 2013-11-15 DIAGNOSIS — Z7901 Long term (current) use of anticoagulants: Secondary | ICD-10-CM

## 2013-11-15 DIAGNOSIS — D696 Thrombocytopenia, unspecified: Secondary | ICD-10-CM | POA: Diagnosis not present

## 2013-11-15 DIAGNOSIS — Z8505 Personal history of malignant neoplasm of liver: Secondary | ICD-10-CM | POA: Diagnosis not present

## 2013-11-15 DIAGNOSIS — Z85828 Personal history of other malignant neoplasm of skin: Secondary | ICD-10-CM | POA: Diagnosis not present

## 2013-11-15 DIAGNOSIS — S3282XA Multiple fractures of pelvis without disruption of pelvic ring, initial encounter for closed fracture: Secondary | ICD-10-CM | POA: Diagnosis not present

## 2013-11-15 DIAGNOSIS — S301XXA Contusion of abdominal wall, initial encounter: Secondary | ICD-10-CM | POA: Diagnosis present

## 2013-11-15 DIAGNOSIS — R571 Hypovolemic shock: Secondary | ICD-10-CM | POA: Diagnosis present

## 2013-11-15 DIAGNOSIS — B1920 Unspecified viral hepatitis C without hepatic coma: Secondary | ICD-10-CM | POA: Diagnosis present

## 2013-11-15 DIAGNOSIS — S298XXA Other specified injuries of thorax, initial encounter: Secondary | ICD-10-CM | POA: Diagnosis not present

## 2013-11-15 DIAGNOSIS — S32309A Unspecified fracture of unspecified ilium, initial encounter for closed fracture: Secondary | ICD-10-CM | POA: Diagnosis present

## 2013-11-15 DIAGNOSIS — S8990XA Unspecified injury of unspecified lower leg, initial encounter: Secondary | ICD-10-CM | POA: Diagnosis not present

## 2013-11-15 DIAGNOSIS — T1490XA Injury, unspecified, initial encounter: Secondary | ICD-10-CM

## 2013-11-15 DIAGNOSIS — R578 Other shock: Secondary | ICD-10-CM

## 2013-11-15 LAB — URINALYSIS, ROUTINE W REFLEX MICROSCOPIC
Glucose, UA: 100 mg/dL — AB
HGB URINE DIPSTICK: NEGATIVE
KETONES UR: 15 mg/dL — AB
NITRITE: NEGATIVE
PROTEIN: 30 mg/dL — AB
Specific Gravity, Urine: 1.029 (ref 1.005–1.030)
UROBILINOGEN UA: 1 mg/dL (ref 0.0–1.0)
pH: 5 (ref 5.0–8.0)

## 2013-11-15 LAB — CBC WITH DIFFERENTIAL/PLATELET
BASOS ABS: 0 10*3/uL (ref 0.0–0.1)
BASOS PCT: 0 % (ref 0–1)
Basophils Absolute: 0 10*3/uL (ref 0.0–0.1)
Basophils Relative: 0 % (ref 0–1)
EOS ABS: 0.1 10*3/uL (ref 0.0–0.7)
EOS ABS: 0 10*3/uL (ref 0.0–0.7)
EOS PCT: 1 % (ref 0–5)
EOS PCT: 0 % (ref 0–5)
HCT: 23 % — ABNORMAL LOW (ref 39.0–52.0)
HEMATOCRIT: 30.7 % — AB (ref 39.0–52.0)
Hemoglobin: 9.8 g/dL — ABNORMAL LOW (ref 13.0–17.0)
Hemoglobin: 7.4 g/dL — ABNORMAL LOW (ref 13.0–17.0)
LYMPHS ABS: 1.3 10*3/uL (ref 0.7–4.0)
LYMPHS PCT: 19 % (ref 12–46)
Lymphocytes Relative: 12 % (ref 12–46)
Lymphs Abs: 0.7 10*3/uL (ref 0.7–4.0)
MCH: 26.5 pg (ref 26.0–34.0)
MCH: 26.7 pg (ref 26.0–34.0)
MCHC: 31.9 g/dL (ref 30.0–36.0)
MCHC: 32.2 g/dL (ref 30.0–36.0)
MCV: 83 fL (ref 78.0–100.0)
MCV: 83 fL (ref 78.0–100.0)
MONO ABS: 0.5 10*3/uL (ref 0.1–1.0)
Monocytes Absolute: 0.5 10*3/uL (ref 0.1–1.0)
Monocytes Relative: 8 % (ref 3–12)
Monocytes Relative: 9 % (ref 3–12)
Neutro Abs: 4.9 10*3/uL (ref 1.7–7.7)
Neutro Abs: 4.6 10*3/uL (ref 1.7–7.7)
Neutrophils Relative %: 72 % (ref 43–77)
Neutrophils Relative %: 79 % — ABNORMAL HIGH (ref 43–77)
PLATELETS: 123 10*3/uL — AB (ref 150–400)
PLATELETS: 79 10*3/uL — AB (ref 150–400)
RBC: 3.7 MIL/uL — ABNORMAL LOW (ref 4.22–5.81)
RBC: 2.77 MIL/uL — AB (ref 4.22–5.81)
RDW: 17.4 % — AB (ref 11.5–15.5)
RDW: 16.7 % — AB (ref 11.5–15.5)
WBC: 6.8 10*3/uL (ref 4.0–10.5)
WBC: 5.9 10*3/uL (ref 4.0–10.5)

## 2013-11-15 LAB — I-STAT CHEM 8, ED
BUN: 23 mg/dL (ref 6–23)
CALCIUM ION: 1.2 mmol/L (ref 1.13–1.30)
CHLORIDE: 111 meq/L (ref 96–112)
CREATININE: 1.2 mg/dL (ref 0.50–1.35)
GLUCOSE: 143 mg/dL — AB (ref 70–99)
HCT: 22 % — ABNORMAL LOW (ref 39.0–52.0)
Hemoglobin: 7.5 g/dL — ABNORMAL LOW (ref 13.0–17.0)
Potassium: 3.8 mEq/L (ref 3.7–5.3)
Sodium: 147 mEq/L (ref 137–147)
TCO2: 21 mmol/L (ref 0–100)

## 2013-11-15 LAB — COMPREHENSIVE METABOLIC PANEL
ALT: 61 U/L — AB (ref 0–53)
AST: 83 U/L — ABNORMAL HIGH (ref 0–37)
Albumin: 3.1 g/dL — ABNORMAL LOW (ref 3.5–5.2)
Alkaline Phosphatase: 99 U/L (ref 39–117)
BUN: 27 mg/dL — ABNORMAL HIGH (ref 6–23)
CO2: 21 meq/L (ref 19–32)
CREATININE: 1.2 mg/dL (ref 0.50–1.35)
Calcium: 9.3 mg/dL (ref 8.4–10.5)
Chloride: 107 mEq/L (ref 96–112)
GFR, EST AFRICAN AMERICAN: 61 mL/min — AB (ref >90)
GFR, EST NON AFRICAN AMERICAN: 53 mL/min — AB (ref >90)
GLUCOSE: 200 mg/dL — AB (ref 70–99)
Potassium: 3.9 mEq/L (ref 3.7–5.3)
SODIUM: 143 meq/L (ref 137–147)
TOTAL PROTEIN: 6.6 g/dL (ref 6.0–8.3)
Total Bilirubin: 0.6 mg/dL (ref 0.3–1.2)

## 2013-11-15 LAB — PREPARE FRESH FROZEN PLASMA: Unit division: 0

## 2013-11-15 LAB — URINE MICROSCOPIC-ADD ON

## 2013-11-15 LAB — I-STAT CG4 LACTIC ACID, ED: LACTIC ACID, VENOUS: 1.39 mmol/L (ref 0.5–2.2)

## 2013-11-15 LAB — PROTIME-INR
INR: 1.94 — ABNORMAL HIGH (ref 0.00–1.49)
INR: 1.53 — ABNORMAL HIGH (ref 0.00–1.49)
PROTHROMBIN TIME: 21.6 s — AB (ref 11.6–15.2)
Prothrombin Time: 18 seconds — ABNORMAL HIGH (ref 11.6–15.2)

## 2013-11-15 LAB — ABO/RH: ABO/RH(D): B POS

## 2013-11-15 LAB — LIPASE, BLOOD: Lipase: 107 U/L — ABNORMAL HIGH (ref 11–59)

## 2013-11-15 LAB — PREPARE RBC (CROSSMATCH)

## 2013-11-15 LAB — MRSA PCR SCREENING: MRSA BY PCR: NEGATIVE

## 2013-11-15 MED ORDER — IOHEXOL 300 MG/ML  SOLN
100.0000 mL | Freq: Once | INTRAMUSCULAR | Status: AC | PRN
  Administered 2013-11-15: 100 mL via INTRAVENOUS

## 2013-11-15 MED ORDER — FENTANYL CITRATE 0.05 MG/ML IJ SOLN
INTRAMUSCULAR | Status: AC | PRN
  Administered 2013-11-15 (×4): 25 ug via INTRAVENOUS

## 2013-11-15 MED ORDER — GELATIN ABSORBABLE 12-7 MM EX MISC
CUTANEOUS | Status: AC
  Filled 2013-11-15: qty 1

## 2013-11-15 MED ORDER — ONDANSETRON HCL 4 MG/2ML IJ SOLN: 4.0000 mg | Freq: Once | INTRAMUSCULAR | Status: DC

## 2013-11-15 MED ORDER — KCL IN DEXTROSE-NACL 20-5-0.9 MEQ/L-%-% IV SOLN
INTRAVENOUS | Status: DC
  Administered 2013-11-15: 1 mL via INTRAVENOUS
  Administered 2013-11-16: 10:00:00 via INTRAVENOUS
  Administered 2013-11-17: 1 mL via INTRAVENOUS
  Filled 2013-11-15 (×5): qty 1000

## 2013-11-15 MED ORDER — VITAMIN K1 10 MG/ML IJ SOLN
10.0000 mg | Freq: Once | INTRAVENOUS | Status: DC
  Filled 2013-11-15: qty 1

## 2013-11-15 MED ORDER — ONDANSETRON HCL 4 MG/2ML IJ SOLN
INTRAMUSCULAR | Status: AC
  Administered 2013-11-15: 4 mg
  Filled 2013-11-15: qty 2

## 2013-11-15 MED ORDER — MIDAZOLAM HCL 2 MG/2ML IJ SOLN
INTRAMUSCULAR | Status: AC | PRN
  Administered 2013-11-15: 1 mg via INTRAVENOUS

## 2013-11-15 MED ORDER — ONDANSETRON HCL 4 MG/2ML IJ SOLN
INTRAMUSCULAR | Status: AC
  Filled 2013-11-15: qty 2

## 2013-11-15 MED ORDER — FENTANYL CITRATE 0.05 MG/ML IJ SOLN
INTRAMUSCULAR | Status: AC
  Filled 2013-11-15: qty 4

## 2013-11-15 MED ORDER — SODIUM CHLORIDE 0.9 % IV BOLUS (SEPSIS)
1000.0000 mL | Freq: Once | INTRAVENOUS | Status: AC
  Administered 2013-11-15: 1000 mL via INTRAVENOUS

## 2013-11-15 MED ORDER — FENTANYL CITRATE 0.05 MG/ML IJ SOLN
INTRAMUSCULAR | Status: AC
  Filled 2013-11-15: qty 2

## 2013-11-15 MED ORDER — ONDANSETRON HCL 4 MG/2ML IJ SOLN: 4.0000 mg | Freq: Four times a day (QID) | INTRAMUSCULAR | Status: DC | PRN

## 2013-11-15 MED ORDER — ONDANSETRON HCL 4 MG PO TABS: 4.0000 mg | ORAL_TABLET | Freq: Four times a day (QID) | ORAL | Status: DC | PRN

## 2013-11-15 MED ORDER — PROTHROMBIN COMPLEX CONC HUMAN 500 UNITS IV KIT
2104.0000 [IU] | PACK | Status: AC
  Administered 2013-11-15: 2104 [IU] via INTRAVENOUS
  Filled 2013-11-15: qty 84

## 2013-11-15 MED ORDER — VITAMIN K1 10 MG/ML IJ SOLN
10.0000 mg | INTRAVENOUS | Status: AC
  Filled 2013-11-15: qty 1

## 2013-11-15 MED ORDER — PANTOPRAZOLE SODIUM 40 MG PO TBEC
40.0000 mg | DELAYED_RELEASE_TABLET | Freq: Every day | ORAL | Status: DC
  Administered 2013-11-15 – 2013-11-18 (×4): 40 mg via ORAL
  Filled 2013-11-15 (×4): qty 1

## 2013-11-15 MED ORDER — EMPTY CONTAINERS FLEXIBLE MISC
25.0000 [IU]/kg | Status: DC
  Filled 2013-11-15: qty 81

## 2013-11-15 MED ORDER — VITAMIN K1 10 MG/ML IJ SOLN
10.0000 mg | INTRAVENOUS | Status: AC
  Administered 2013-11-15: 10 mg via INTRAVENOUS
  Filled 2013-11-15: qty 1

## 2013-11-15 MED ORDER — MIDAZOLAM HCL 2 MG/2ML IJ SOLN
INTRAMUSCULAR | Status: AC
  Filled 2013-11-15: qty 4

## 2013-11-15 MED ORDER — IOHEXOL 300 MG/ML  SOLN
150.0000 mL | Freq: Once | INTRAMUSCULAR | Status: AC | PRN
  Administered 2013-11-15: 1 mL via INTRAVENOUS

## 2013-11-15 MED ORDER — ONDANSETRON HCL 4 MG/2ML IJ SOLN
4.0000 mg | Freq: Once | INTRAMUSCULAR | Status: AC
  Administered 2013-11-15: 4 mg via INTRAVENOUS

## 2013-11-15 MED ORDER — PANTOPRAZOLE SODIUM 40 MG IV SOLR
40.0000 mg | Freq: Every day | INTRAVENOUS | Status: DC
  Filled 2013-11-15 (×3): qty 40

## 2013-11-15 MED ORDER — FENTANYL CITRATE 0.05 MG/ML IJ SOLN
50.0000 ug | INTRAMUSCULAR | Status: DC | PRN
  Administered 2013-11-16 – 2013-11-17 (×6): 50 ug via INTRAVENOUS
  Filled 2013-11-15 (×7): qty 2

## 2013-11-15 MED ORDER — FENTANYL CITRATE 0.05 MG/ML IJ SOLN
50.0000 ug | Freq: Once | INTRAMUSCULAR | Status: AC
  Administered 2013-11-15: 50 ug via INTRAVENOUS

## 2013-11-15 NOTE — Progress Notes (Signed)
Patient ID: Lee Allen, male   DOB: 1925-01-13, 80 y.o.   MRN: [id number] CT shows pelvic hematoma with active extrav. Consulted IR for angioembolization. Georganna Skeans, MD, MPH, FACS Trauma: [phone] General Surgery: [phone]

## 2013-11-15 NOTE — ED Notes (Signed)
Dr. Cherlynn June and Audie Pinto were notified of CG-4  result

## 2013-11-15 NOTE — Progress Notes (Signed)
Patient ID: Lee Allen, male   DOB: 11-May-1925, 81 y.o.   MRN: [id number] Stable post angio. Allow sips. 2u FFP as INR > 1.5.  Georganna Skeans, MD, MPH, FACS Trauma: [phone] General Surgery: [phone]

## 2013-11-15 NOTE — Sedation Documentation (Signed)
PRBCs complete

## 2013-11-15 NOTE — ED Provider Notes (Signed)
CSN: [id number]     Arrival date & time 11/15/13  1437 History   First MD Initiated Contact with Patient 11/15/13 1505     No chief complaint on file.   HPI 80 year old male with a history of hepatitis C, liver cirrhosis, atrial flutter on Coumadin presents with a pelvic injury.  [place] he was knocked down the ground by cow and she stomped on his pelvis. He was on concrete when this happened. He complains of pain to his right hip which is moderate in severity. He cannot stand on it. He does has a large hematoma to his right thigh. He denies hitting his head. No loss of consciousness. Denies headache, neck pain, chest pain, abdominal pain, back pain.  EMS recorded a blood pressure of 91/29, however on arrival he was initially normotensive with systolic in the 341P. Therefore he was not leveled initially. Early in his ED course, he became persistently hypotensive with blood pressures in the 80s over 50s and mild tachycardia. At this point he was upgraded to a level II and eventually a level I trauma.  Past Medical History  Diagnosis Date  . Hepatitis C   . Liver cirrhosis   . Shingles   . Nephrolithiasis   . Skin cancer   . Atrial flutter     09/2012 s/p TEE/DCCV  . GERD (gastroesophageal reflux disease)   . [place] (hepatocellular carcinoma)     s/p ablation, with extension to Abdominal/chest wall  . History of tobacco abuse     quit 1972  . CAD (coronary artery disease)     coronary calcification by chest CT 09/2012  . Ascending aorta dilation     4 cm by chest CT 2/14   Past Surgical History  Procedure Laterality Date  . Lithotripsy    . Skin cancer excision    . Ablation  2012    liver  . Tee without cardioversion N/A 10/04/2012    Procedure: TRANSESOPHAGEAL ECHOCARDIOGRAM (TEE);  Surgeon: Peter M Martinique, MD;  Location: [facility] ENDOSCOPY;  Service: Cardiovascular;  Laterality: N/A;  . Cardioversion N/A 10/04/2012    Procedure: CARDIOVERSION;  Surgeon: Peter M  Martinique, MD;  Location: [facility] ENDOSCOPY;  Service: Cardiovascular;  Laterality: N/A;   Family History  Problem Relation Age of Onset  . Leukemia Father    History  Substance Use Topics  . Smoking status: Former Smoker    Types: Cigarettes    Quit date: 08/15/1970  . Smokeless tobacco: Not on file  . Alcohol Use: 1.8 oz/week    3 Cans of beer per week     Comment: occasionally    Review of Systems    Allergies  Penicillins  Home Medications   No current outpatient prescriptions on file. BP 110/66  Pulse 69  Temp(Src) 97.4 F (36.3 C) (Oral)  Resp 17  Ht 5\' 8"  (1.727 m)  Wt 179 lb (81.194 kg)  BMI 27.22 kg/m2  SpO2 96% Physical Exam  Constitutional: He is oriented to person, place, and time.  HENT:  No hematoma, bruising, abrasions, or lacerations  Eyes:  Eyes are atraumatic to exam  Neck:  No cervical spinal tenderness or deformity  Pulmonary/Chest:  Chest wall stable to compression with no tenderness, no crepitus, no bruising or abrasions  Abdominal:  Abdominal wall without hematoma, bruising, abrasions.  Musculoskeletal:  Large hematoma to right medial upper thigh. Soft compartments. 2+ DP pulses in bilateral lower extremities. Tenderness to palpation over right greater  trochanter. Pelvis is stable to compression. He has no cervical, thoracic, or lumbar spine tenderness or deformity. His clavicles and chest are stable and nontender to compression. Aside from the large hematoma to his right thigh, his extremities are atraumatic.  Neurological: He is alert and oriented to person, place, and time. No cranial nerve deficit. He exhibits normal muscle tone. Coordination normal.  5 out of 5 strength in bilateral upper and lower extremity is, normal sensation to light touch throughout.  Skin:  Large hematoma to right eye as detailed.    ED Course  Procedures (including critical care time) Labs Review Labs Reviewed  CBC WITH DIFFERENTIAL - Abnormal; Notable for the  following:    RBC 3.70 (*)    Hemoglobin 9.8 (*)    HCT 30.7 (*)    RDW 17.4 (*)    Platelets 123 (*)    All other components within normal limits  COMPREHENSIVE METABOLIC PANEL - Abnormal; Notable for the following:    Glucose, Bld 200 (*)    BUN 27 (*)    Albumin 3.1 (*)    AST 83 (*)    ALT 61 (*)    GFR calc non Af Amer 53 (*)    GFR calc Af Amer 61 (*)    All other components within normal limits  LIPASE, BLOOD - Abnormal; Notable for the following:    Lipase 107 (*)    All other components within normal limits  PROTIME-INR - Abnormal; Notable for the following:    Prothrombin Time 21.6 (*)    INR 1.94 (*)    All other components within normal limits  I-STAT CHEM 8, ED - Abnormal; Notable for the following:    Glucose, Bld 143 (*)    Hemoglobin 7.5 (*)    HCT 22.0 (*)    All other components within normal limits  PROTIME-INR  URINALYSIS, ROUTINE W REFLEX MICROSCOPIC  CBC WITH DIFFERENTIAL  I-STAT CG4 LACTIC ACID, ED  PREPARE RBC (CROSSMATCH)  TYPE AND SCREEN  ABO/RH  PREPARE FRESH FROZEN PLASMA  PREPARE FRESH FROZEN PLASMA  PREPARE RBC (CROSSMATCH)   Imaging Review Ct Abdomen Pelvis W Contrast  11/15/2013   CLINICAL DATA:  Recent traumatic injury, stepped on by a cow with pelvic pain  EXAM: CT ABDOMEN AND PELVIS WITH CONTRAST  TECHNIQUE: Multidetector CT imaging of the abdomen and pelvis was performed using the standard protocol following bolus administration of intravenous contrast.  CONTRAST:  193mL OMNIPAQUE IOHEXOL 300 MG/ML  SOLN  COMPARISON:  10/03/2012, 06/26/2007  FINDINGS: Lung bases are free of acute infiltrate or sizable effusion.  The gallbladder is well distended with multiple small gallstones within. The liver demonstrates a rounded area of decreased attenuation along the lateral aspect of the right lobe measuring 2.2 cm. This may represent a cyst but was not present on a recent exam dated 10/03/2012. Given the recent injury to an adjacent perihepatic fluid  would be difficult to exclude a small hepatic laceration in this region. No definitive rib fractures noted in this region.  The spleen, adrenal glands and pancreas are within normal limits with the exception of a small cyst within the body of the pancreas. The kidneys demonstrate multiple cysts bilaterally. No findings to suggest complicated cysts are identified. Delayed images through the kidneys demonstrate normal excretion of contrast material.  In the pelvis there is a large hematoma identified along the lateral pelvic wall on the right. It measures approximately 15 x 5.5 cm in greatest AP and transverse  dimensions and extends for approximately be 16 cm 10 cm in the craniocaudad plane. There are areas of the active extravasation within this hematoma in the midportion at the level of the acetabulum. This displaces the urinary bladder which is decompressed by Foley catheter. Multiple prostate calcifications are identified. Comminuted fracture of the right iliac wing is noted with enlargement of the iliacus muscle on the right consistent with intramuscular hematoma. No active extravasation is noted in this region. Diffuse prostatic calcifications are noted. There is a superior pubic ramus fracture identified on the right as well. No other definitive fractures are seen.  IMPRESSION: Changes consistent with a recent injury with fractures of the right iliac wing and right superior pubic ramus. There associated muscular hematoma within the right iliacus muscle as well as a large pelvic hematoma with active extravasation as described.  Multiple cystic lesions within the kidneys and pancreas.  Cholelithiasis.  Hypodensity within the liver with adjacent perihepatic fluid. Given the history a would be difficult to exclude a small laceration in this region although no active extravasation is seen.  Critical Value/emergent results were discussed in person at the time of exam performance on 11/15/2013 at 530 PM to Dr. Georganna Skeans, who verbally acknowledged these results.   Electronically Signed   By: Inez Catalina M.D.   On: 11/15/2013 18:10   Dg Pelvis Portable  11/15/2013   CLINICAL DATA:  Pt trampled by cow, pain mostly in right hip area, low bp  EXAM: PORTABLE PELVIS 1-2 VIEWS  COMPARISON:  None.  FINDINGS: An area of cortical irregularity, with a possible avulsion component, is appreciated along the lateral aspect of the right iliac wing. There is overlying soft tissue swelling, concerning for hematoma.  A nondisplaced fracture with impaction is appreciated along the distal aspect of the superior pubic ramus on the right.  An oblique lucency projects along the body of the superior pubic ramus on the left differential considerations Mach line versus fracture.  Degenerative changes appreciated within the lower lumbar spine and right and left hips. Dystrophic calcifications overlying the pubis region on the left.  Bones osteopenic.  IMPRESSION: 1. Superior pubic ramus fracture on the right 2. Findings concerning for avulsion fracture lateral aspect of the iliac wing on the right. With findings also concerning for an overlying hematoma. 3. These results were called by telephone at the time of interpretation on 11/15/2013 at 4:33 PM to Dr. Leonard Schwartz , who verbally acknowledged these results. 4. Lucency of the body of the superior pubic ramus on the left fracture versus Mach line   Electronically Signed   By: Margaree Mackintosh M.D.   On: 11/15/2013 16:35   Ct Femur Right W Contrast  11/15/2013   CLINICAL DATA:  Recent traumatic injury. Patient's stepped on by a cow  EXAM: CT OF THE RIGHT LOWER EXTREMITY WITH CONTRAST  TECHNIQUE: Contiguous axial images were obtained to the right lower extremity from the pelvis to the level of the knee joint following contrast administration. Sagittal and coronal reconstructions were obtained.  CONTRAST:  155mL OMNIPAQUE IOHEXOL 300 MG/ML  SOLN  COMPARISON:  None.  FINDINGS: There again noted  changes consistent with superior pubic ramus fracture on the right. The femur is well visualized and shows no evidence of acute fracture. The soft tissue structures shown the musculature of the thigh to be within normal limits. No definitive muscular hematomas are seen. Soft tissue fluid attenuation is identified consistent with localized subcutaneous hematoma. No definitive active extravasation is  noted however. The large pelvic hematoma is again identified and stable.  IMPRESSION: No evidence of femoral fracture.  Right superior pubic ramus fracture.  Soft tissue changes are noted in the medial thigh consistent with localized hemorrhage although no active extravasation is noted. No hematoma is noted within the musculature.   Electronically Signed   By: Inez Catalina M.D.   On: 11/15/2013 18:12   Dg Chest Port 1 View  11/15/2013   CLINICAL DATA:  Pt trampled by cow, pain mostly in right hip area, low bp  EXAM: PORTABLE CHEST - 1 VIEW  COMPARISON:  CT ANGIO CHEST W/CM &/OR WO/CM dated 10/03/2012; DG CHEST 2 VIEW dated 07/07/2007  FINDINGS: Low lung volumes. Cardiac silhouette enlarged. Aorta tortuous and ectatic. Lungs are clear. Degenerative changes right and left shoulders osseous structures otherwise unremarkable. There is no evidence of a pneumothorax.  IMPRESSION: No acute cardiopulmonary disease.   Electronically Signed   By: Margaree Mackintosh M.D.   On: 11/15/2013 16:25     EKG Interpretation None      MDM    An 80 year old male on Coumadin for A. fib presents secondary to blunt trauma. He was knocked to the ground by a cow which didn't stone to his pelvis. He was on concrete. He denies injury, loss of consciousness, neck pain, back pain, chest pain, abdominal pain.  On arrival to the ED he was initially normotensive, mildly tachycardic. He was not level at this time. His exam demonstrates large hematoma over his right medial thigh. Tenderness to palpation over his greater trochanter, right side.  His pelvis is stable to compression. He has no external signs of head trauma. No spinal tenderness. No external signs of injury to his chest wall, abdomen, or other extremities.  On reevaluation the patient hypotensive with blood pressures 80s over 50s. Patient was made a level II trauma initially. When his blood pressure fell to improve with 2 L of IV fluid his upgrade to a level I trauma. Initial labs showed 3 g hemoglobin drop from several months. He was given Kcentra to reverse anticoagulation. Orthopedic surgery was consult.  CT scan of his abdomen and pelvis with contrast demonstrates superior pubic ramus fracture on the right, left iliac wing fracture, large pelvic hematoma with active extravasation. He also has some blood around his liver, and small liver laceration without active extravasation is not excluded.   patient was taken to IR emergently for hemorrhage control and will be admitted to the trauma service.  Final diagnoses:  Pelvic fracture  Acute blood loss anemia  Blunt trauma  Hemorrhagic shock       Wendall Papa, MD 11/16/13 0040

## 2013-11-15 NOTE — ED Notes (Signed)
To CT

## 2013-11-15 NOTE — Procedures (Signed)
FAST  Pre-procedure DX - hypotension, pelvic fracture Post-procedure DX - no free abdominal fluid, suspect pelvic hematoma Procedure - FAST Surgeon - Georganna Skeans, MD Procedure - the patient's abdomen was imaged with the U/S. The RUQ was imaged and there was no fluid in Morrison's pouch between the right kidney and liver. The epigastrium was imaged and there was a poor window of the pericardium. The LUQ was imaged and no fluid was seen between the L kidney and the spleen. L renal cyst seen. The pelvis was imaged and there was a suspected hematoma but no fluid next to the bladder. Impression - no free fluid, suspect pelvic hematoma  Georganna Skeans, MD, MPH, FACS Trauma: [phone] General Surgery: [phone]

## 2013-11-15 NOTE — Procedures (Signed)
Successful RT obturator/internal pudendal branch peripheral embo with gelfoam and 4  44mm microcoils Good hemostasis No other branch bleeding post embo Stable

## 2013-11-15 NOTE — Progress Notes (Signed)
Orthopedic Tech Progress Note Patient Details:  Lee Allen Jul 03, 1925 [id number]  Patient ID: [name], male   DOB: Jun 03, 1925, 80 y.o.   MRN: [id number] Upgraded to leve 1 trauma; made level 1 trauma visit  Hildred Priest 11/15/2013, 4:51 PM

## 2013-11-15 NOTE — Consult Note (Signed)
Reason for Consult:pelvic fracture Referring Physician: EDP  HPI: Lee Allen is an 80 y.o. male who was stepped on by a cow. It stepped on his right thigh. He fell but had no LOC. C/O pain in his right thigh only. He was initially made a level 2 trauma but has been hypotensive. I was asked to see him and I upgraded him to level 1. His daughter assists with his history. He is on Coumadin for A fib.   Past Medical History  Diagnosis Date  . Hepatitis C   . Liver cirrhosis   . Shingles   . Nephrolithiasis   . Skin cancer   . Atrial flutter     09/2012 s/p TEE/DCCV  . GERD (gastroesophageal reflux disease)   . Standard (hepatocellular carcinoma)     s/p ablation, with extension to Abdominal/chest wall  . History of tobacco abuse     quit 1972  . CAD (coronary artery disease)     coronary calcification by chest CT 09/2012  . Ascending aorta dilation     4 cm by chest CT 2/14    Past Surgical History  Procedure Laterality Date  . Lithotripsy    . Skin cancer excision    . Ablation  2012    liver  . Tee without cardioversion N/A 10/04/2012    Procedure: TRANSESOPHAGEAL ECHOCARDIOGRAM (TEE);  Surgeon: Peter M Martinique, MD;  Location: [facility] ENDOSCOPY;  Service: Cardiovascular;  Laterality: N/A;  . Cardioversion N/A 10/04/2012    Procedure: CARDIOVERSION;  Surgeon: Peter M Martinique, MD;  Location: [facility] ENDOSCOPY;  Service: Cardiovascular;  Laterality: N/A;    Family History  Problem Relation Age of Onset  . Leukemia Father     Social History:  reports that he quit smoking about 43 years ago. His smoking use included Cigarettes. He smoked 0.00 packs per day. He does not have any smokeless tobacco history on file. He reports that he drinks about 1.8 ounces of alcohol per week. He reports that he does not use illicit drugs.  Allergies:  Allergies  Allergen Reactions  . Penicillins Other (See Comments)    unknown    Medications: I have reviewed the patient's current medications.  Results  for orders placed during the hospital encounter of 11/15/13 (from the past 48 hour(s))  TYPE AND SCREEN     Status: None   Collection Time    11/15/13  2:15 PM      Result Value Ref Range   ABO/RH(D) B POS     Antibody Screen NEG     Sample Expiration 11/18/2013     Unit Number [id number]     Blood Component Type RBC LR PHER2     Unit division 00     Status of Unit ISSUED     Transfusion Status OK TO TRANSFUSE     Crossmatch Result Compatible     Unit Number [id number]     Blood Component Type RBC LR PHER2     Unit division 00     Status of Unit ISSUED     Transfusion Status OK TO TRANSFUSE     Crossmatch Result Compatible     Unit Number [id number]     Blood Component Type RED CELLS,LR     Unit division 00     Status of Unit ISSUED     Transfusion Status OK TO TRANSFUSE     Crossmatch Result Compatible     Unit Number [id number]     Blood Component Type  RED CELLS,LR     Unit division 00     Status of Unit ALLOCATED     Transfusion Status OK TO TRANSFUSE     Crossmatch Result Compatible     Unit Number [id number]     Blood Component Type RED CELLS,LR     Unit division 00     Status of Unit ALLOCATED     Transfusion Status OK TO TRANSFUSE     Crossmatch Result Compatible     Unit Number [id number]     Blood Component Type RED CELLS,LR     Unit division 00     Status of Unit ALLOCATED     Transfusion Status OK TO TRANSFUSE     Crossmatch Result Compatible     Unit Number [id number]     Blood Component Type RED CELLS,LR     Unit division 00     Status of Unit ISSUED     Transfusion Status OK TO TRANSFUSE     Crossmatch Result Compatible     Unit Number [id number]     Blood Component Type RED CELLS,LR     Unit division 00     Status of Unit ALLOCATED     Transfusion Status OK TO TRANSFUSE     Crossmatch Result Compatible    ABO/RH     Status: None   Collection Time    11/15/13  2:15 PM      Result Value Ref Range   ABO/RH(D) B POS     PREPARE FRESH FROZEN PLASMA     Status: None   Collection Time    11/15/13  2:15 PM      Result Value Ref Range   Unit Number [id number]     Blood Component Type THAWED PLASMA     Unit division 00     Status of Unit REL FROM [facility]     Unit tag comment VERBAL ORDERS PER DR BEATON     Transfusion Status OK TO TRANSFUSE    CBC WITH DIFFERENTIAL     Status: Abnormal   Collection Time    11/15/13  2:55 PM      Result Value Ref Range   WBC 6.8  4.0 - 10.5 K/uL   RBC 3.70 (*) 4.22 - 5.81 MIL/uL   Hemoglobin 9.8 (*) 13.0 - 17.0 g/dL   HCT 30.7 (*) 39.0 - 52.0 %   MCV 83.0  78.0 - 100.0 fL   MCH 26.5  26.0 - 34.0 pg   MCHC 31.9  30.0 - 36.0 g/dL   RDW 17.4 (*) 11.5 - 15.5 %   Platelets 123 (*) 150 - 400 K/uL   Neutrophils Relative % 72  43 - 77 %   Neutro Abs 4.9  1.7 - 7.7 K/uL   Lymphocytes Relative 19  12 - 46 %   Lymphs Abs 1.3  0.7 - 4.0 K/uL   Monocytes Relative 8  3 - 12 %   Monocytes Absolute 0.5  0.1 - 1.0 K/uL   Eosinophils Relative 1  0 - 5 %   Eosinophils Absolute 0.1  0.0 - 0.7 K/uL   Basophils Relative 0  0 - 1 %   Basophils Absolute 0.0  0.0 - 0.1 K/uL  COMPREHENSIVE METABOLIC PANEL     Status: Abnormal   Collection Time    11/15/13  2:55 PM      Result Value Ref Range   Sodium 143  137 - 147 mEq/L   Potassium 3.9  3.7 - 5.3 mEq/L   Chloride 107  96 - 112 mEq/L   CO2 21  19 - 32 mEq/L   Glucose, Bld 200 (*) 70 - 99 mg/dL   BUN 27 (*) 6 - 23 mg/dL   Creatinine, Ser 1.20  0.50 - 1.35 mg/dL   Calcium 9.3  8.4 - 10.5 mg/dL   Total Protein 6.6  6.0 - 8.3 g/dL   Albumin 3.1 (*) 3.5 - 5.2 g/dL   AST 83 (*) 0 - 37 U/L   ALT 61 (*) 0 - 53 U/L   Alkaline Phosphatase 99  39 - 117 U/L   Total Bilirubin 0.6  0.3 - 1.2 mg/dL   GFR calc non Af Amer 53 (*) >90 mL/min   GFR calc Af Amer 61 (*) >90 mL/min   Comment: (NOTE)     The eGFR has been calculated using the CKD EPI equation.     This calculation has not been validated in all clinical situations.     eGFR's  persistently <90 mL/min signify possible Chronic Kidney     Disease.  LIPASE, BLOOD     Status: Abnormal   Collection Time    11/15/13  2:55 PM      Result Value Ref Range   Lipase 107 (*) 11 - 59 U/L  PROTIME-INR     Status: Abnormal   Collection Time    11/15/13  2:55 PM      Result Value Ref Range   Prothrombin Time 21.6 (*) 11.6 - 15.2 seconds   INR 1.94 (*) 0.00 - 1.49  PREPARE RBC (CROSSMATCH)     Status: None   Collection Time    11/15/13  4:00 PM      Result Value Ref Range   Order Confirmation ORDER PROCESSED BY BLOOD BANK    I-STAT CHEM 8, ED     Status: Abnormal   Collection Time    11/15/13  4:08 PM      Result Value Ref Range   Sodium 147  137 - 147 mEq/L   Potassium 3.8  3.7 - 5.3 mEq/L   Chloride 111  96 - 112 mEq/L   BUN 23  6 - 23 mg/dL   Creatinine, Ser 1.20  0.50 - 1.35 mg/dL   Glucose, Bld 143 (*) 70 - 99 mg/dL   Calcium, Ion 1.20  1.13 - 1.30 mmol/L   TCO2 21  0 - 100 mmol/L   Hemoglobin 7.5 (*) 13.0 - 17.0 g/dL   HCT 22.0 (*) 39.0 - 52.0 %  I-STAT CG4 LACTIC ACID, ED     Status: None   Collection Time    11/15/13  4:08 PM      Result Value Ref Range   Lactic Acid, Venous 1.39  0.5 - 2.2 mmol/L  PREPARE FRESH FROZEN PLASMA     Status: None   Collection Time    11/15/13  5:00 PM      Result Value Ref Range   Unit Number [id number]     Blood Component Type THAWED PLASMA     Unit division 00     Status of Unit ISSUED     Transfusion Status OK TO TRANSFUSE     Unit Number [id number]     Blood Component Type THAWED PLASMA     Unit division 00     Status of Unit ISSUED     Transfusion Status OK TO TRANSFUSE    PREPARE RBC (CROSSMATCH)     Status:  None   Collection Time    11/15/13  5:00 PM      Result Value Ref Range   Order Confirmation ORDER PROCESSED BY BLOOD BANK      Ct Abdomen Pelvis W Contrast  11/15/2013   CLINICAL DATA:  Recent traumatic injury, stepped on by a cow with pelvic pain  EXAM: CT ABDOMEN AND PELVIS WITH CONTRAST   TECHNIQUE: Multidetector CT imaging of the abdomen and pelvis was performed using the standard protocol following bolus administration of intravenous contrast.  CONTRAST:  157m OMNIPAQUE IOHEXOL 300 MG/ML  SOLN  COMPARISON:  10/03/2012, 06/26/2007  FINDINGS: Lung bases are free of acute infiltrate or sizable effusion.  The gallbladder is well distended with multiple small gallstones within. The liver demonstrates a rounded area of decreased attenuation along the lateral aspect of the right lobe measuring 2.2 cm. This may represent a cyst but was not present on a recent exam dated 10/03/2012. Given the recent injury to an adjacent perihepatic fluid would be difficult to exclude a small hepatic laceration in this region. No definitive rib fractures noted in this region.  The spleen, adrenal glands and pancreas are within normal limits with the exception of a small cyst within the body of the pancreas. The kidneys demonstrate multiple cysts bilaterally. No findings to suggest complicated cysts are identified. Delayed images through the kidneys demonstrate normal excretion of contrast material.  In the pelvis there is a large hematoma identified along the lateral pelvic wall on the right. It measures approximately 15 x 5.5 cm in greatest AP and transverse dimensions and extends for approximately be 16 cm 10 cm in the craniocaudad plane. There are areas of the active extravasation within this hematoma in the midportion at the level of the acetabulum. This displaces the urinary bladder which is decompressed by Foley catheter. Multiple prostate calcifications are identified. Comminuted fracture of the right iliac wing is noted with enlargement of the iliacus muscle on the right consistent with intramuscular hematoma. No active extravasation is noted in this region. Diffuse prostatic calcifications are noted. There is a superior pubic ramus fracture identified on the right as well. No other definitive fractures are seen.   IMPRESSION: Changes consistent with a recent injury with fractures of the right iliac wing and right superior pubic ramus. There associated muscular hematoma within the right iliacus muscle as well as a large pelvic hematoma with active extravasation as described.  Multiple cystic lesions within the kidneys and pancreas.  Cholelithiasis.  Hypodensity within the liver with adjacent perihepatic fluid. Given the history a would be difficult to exclude a small laceration in this region although no active extravasation is seen.  Critical Value/emergent results were discussed in person at the time of exam performance on 11/15/2013 at 530 PM to Dr. BGeorganna Skeans who verbally acknowledged these results.   Electronically Signed   By: MInez CatalinaM.D.   On: 11/15/2013 18:10   Dg Pelvis Portable  11/15/2013   CLINICAL DATA:  Pt trampled by cow, pain mostly in right hip area, low bp  EXAM: PORTABLE PELVIS 1-2 VIEWS  COMPARISON:  None.  FINDINGS: An area of cortical irregularity, with a possible avulsion component, is appreciated along the lateral aspect of the right iliac wing. There is overlying soft tissue swelling, concerning for hematoma.  A nondisplaced fracture with impaction is appreciated along the distal aspect of the superior pubic ramus on the right.  An oblique lucency projects along the body of the superior pubic ramus on  the left differential considerations Mach line versus fracture.  Degenerative changes appreciated within the lower lumbar spine and right and left hips. Dystrophic calcifications overlying the pubis region on the left.  Bones osteopenic.  IMPRESSION: 1. Superior pubic ramus fracture on the right 2. Findings concerning for avulsion fracture lateral aspect of the iliac wing on the right. With findings also concerning for an overlying hematoma. 3. These results were called by telephone at the time of interpretation on 11/15/2013 at 4:33 PM to Dr. Leonard Schwartz , who verbally acknowledged these  results. 4. Lucency of the body of the superior pubic ramus on the left fracture versus Mach line   Electronically Signed   By: Margaree Mackintosh M.D.   On: 11/15/2013 16:35   Ct Femur Right W Contrast  11/15/2013   CLINICAL DATA:  Recent traumatic injury. Patient's stepped on by a cow  EXAM: CT OF THE RIGHT LOWER EXTREMITY WITH CONTRAST  TECHNIQUE: Contiguous axial images were obtained to the right lower extremity from the pelvis to the level of the knee joint following contrast administration. Sagittal and coronal reconstructions were obtained.  CONTRAST:  151m OMNIPAQUE IOHEXOL 300 MG/ML  SOLN  COMPARISON:  None.  FINDINGS: There again noted changes consistent with superior pubic ramus fracture on the right. The femur is well visualized and shows no evidence of acute fracture. The soft tissue structures shown the musculature of the thigh to be within normal limits. No definitive muscular hematomas are seen. Soft tissue fluid attenuation is identified consistent with localized subcutaneous hematoma. No definitive active extravasation is noted however. The large pelvic hematoma is again identified and stable.  IMPRESSION: No evidence of femoral fracture.  Right superior pubic ramus fracture.  Soft tissue changes are noted in the medial thigh consistent with localized hemorrhage although no active extravasation is noted. No hematoma is noted within the musculature.   Electronically Signed   By: MInez CatalinaM.D.   On: 11/15/2013 18:12   Dg Chest Port 1 View  11/15/2013   CLINICAL DATA:  Pt trampled by cow, pain mostly in right hip area, low bp  EXAM: PORTABLE CHEST - 1 VIEW  COMPARISON:  CT ANGIO CHEST W/CM &/OR WO/CM dated 10/03/2012; DG CHEST 2 VIEW dated 07/07/2007  FINDINGS: Low lung volumes. Cardiac silhouette enlarged. Aorta tortuous and ectatic. Lungs are clear. Degenerative changes right and left shoulders osseous structures otherwise unremarkable. There is no evidence of a pneumothorax.  IMPRESSION: No  acute cardiopulmonary disease.   Electronically Signed   By: HMargaree MackintoshM.D.   On: 11/15/2013 16:25     Vitals Temp:  [97.5 F (36.4 C)-97.9 F (36.6 C)] 97.8 F (36.6 C) (04/03 1704) Pulse Rate:  [60-109] 69 (04/03 1815) Resp:  [10-20] 15 (04/03 1815) BP: (70-120)/(29-71) 97/59 mmHg (04/03 1815) SpO2:  [89 %-100 %] 100 % (04/03 1815) Weight:  [81.194 kg (179 lb)] 81.194 kg (179 lb) (04/03 1542) Body mass index is 27.22 kg/(m^2).  Physical Exam: elderly male, a and o, c/o abdominal pain and distension. Grossly n/v intact BLE.      Assessment/Plan: Impression: non displaced right superior pubic ramus fracture and iliac wing fractures after A/P compression injury, now with evolving abdominal hematoma.  Treatment: The pelvic fractures are stable, and the patient may be weight bearing as tolerated to BLE once his associated pelvic bleeding is controlled and overall health status allows mobilization. No formal ortho follow up necessary.  Lee Allen M 11/15/2013, 6:45 PM

## 2013-11-15 NOTE — Progress Notes (Signed)
Orthopedic Tech Progress Note Patient Details:  Lee Allen 1925/02/22 [id number]  Patient ID: [name], male   DOB: 07-14-1925, 80 y.o.   MRN: [id number] Made level 2 trauma visit  Hildred Priest 11/15/2013, 3:34 PM

## 2013-11-15 NOTE — Sedation Documentation (Signed)
Patient is resting comfortably.states pain is more tolerable

## 2013-11-15 NOTE — H&P (Signed)
Lee Allen is an 80 y.o. male.   Chief Complaint: right thigh pain HPI: Patient was stepped on by a cow. It stepped on his right thigh. He fell but had no LOC. C/O pain in his right thigh only. He was initially made a level 2 trauma but has been hypotensive. I was asked to see him and I upgraded him to level 1. His daughter assists with his history. He is on Coumadin for  A fib.  Past Medical History  Diagnosis Date  . Hepatitis C   . Liver cirrhosis   . Shingles   . Nephrolithiasis   . Skin cancer   . Atrial flutter     09/2012 s/p TEE/DCCV  . GERD (gastroesophageal reflux disease)   . [place] (hepatocellular carcinoma)     s/p ablation, with extension to Abdominal/chest wall  . History of tobacco abuse     quit 1972  . CAD (coronary artery disease)     coronary calcification by chest CT 09/2012  . Ascending aorta dilation     4 cm by chest CT 2/14    Past Surgical History  Procedure Laterality Date  . Lithotripsy    . Skin cancer excision    . Ablation  2012    liver  . Tee without cardioversion N/A 10/04/2012    Procedure: TRANSESOPHAGEAL ECHOCARDIOGRAM (TEE);  Surgeon: Peter M Martinique, MD;  Location: [facility] ENDOSCOPY;  Service: Cardiovascular;  Laterality: N/A;  . Cardioversion N/A 10/04/2012    Procedure: CARDIOVERSION;  Surgeon: Peter M Martinique, MD;  Location: [facility] ENDOSCOPY;  Service: Cardiovascular;  Laterality: N/A;    Family History  Problem Relation Age of Onset  . Leukemia Father    Social History:  reports that he quit smoking about 43 years ago. His smoking use included Cigarettes. He smoked 0.00 packs per day. He does not have any smokeless tobacco history on file. He reports that he drinks about 1.8 ounces of alcohol per week. He reports that he does not use illicit drugs.  Allergies:  Allergies  Allergen Reactions  . Penicillins Other (See Comments)    unknown     (Not in a hospital admission)  Results for orders placed during the hospital encounter of  11/15/13 (from the past 48 hour(s))  TYPE AND SCREEN     Status: None   Collection Time    11/15/13  2:15 PM      Result Value Ref Range   ABO/RH(D) B POS     Antibody Screen NEG     Sample Expiration 11/18/2013     Unit Number [id number]     Blood Component Type RBC LR PHER2     Unit division 00     Status of Unit ALLOCATED     Transfusion Status OK TO TRANSFUSE     Crossmatch Result Compatible     Unit Number [id number]     Blood Component Type RBC LR PHER2     Unit division 00     Status of Unit ISSUED     Transfusion Status OK TO TRANSFUSE     Crossmatch Result Compatible     Unit Number [id number]     Blood Component Type RED CELLS,LR     Unit division 00     Status of Unit ISSUED     Transfusion Status OK TO TRANSFUSE     Crossmatch Result Compatible     Unit Number [id number]     Blood Component Type RED CELLS,LR  Unit division 00     Status of Unit ALLOCATED     Transfusion Status OK TO TRANSFUSE     Crossmatch Result Compatible     Unit Number [id number]     Blood Component Type RED CELLS,LR     Unit division 00     Status of Unit ALLOCATED     Transfusion Status OK TO TRANSFUSE     Crossmatch Result Compatible     Unit Number [id number]     Blood Component Type RED CELLS,LR     Unit division 00     Status of Unit ALLOCATED     Transfusion Status OK TO TRANSFUSE     Crossmatch Result Compatible    ABO/RH     Status: None   Collection Time    11/15/13  2:15 PM      Result Value Ref Range   ABO/RH(D) B POS    PREPARE FRESH FROZEN PLASMA     Status: None   Collection Time    11/15/13  2:15 PM      Result Value Ref Range   Unit Number [id number]     Blood Component Type THAWED PLASMA     Unit division 00     Status of Unit ALLOCATED     Unit tag comment VERBAL ORDERS PER DR BEATON     Transfusion Status OK TO TRANSFUSE    CBC WITH DIFFERENTIAL     Status: Abnormal   Collection Time    11/15/13  2:55 PM      Result Value Ref  Range   WBC 6.8  4.0 - 10.5 K/uL   RBC 3.70 (*) 4.22 - 5.81 MIL/uL   Hemoglobin 9.8 (*) 13.0 - 17.0 g/dL   HCT 30.7 (*) 39.0 - 52.0 %   MCV 83.0  78.0 - 100.0 fL   MCH 26.5  26.0 - 34.0 pg   MCHC 31.9  30.0 - 36.0 g/dL   RDW 17.4 (*) 11.5 - 15.5 %   Platelets 123 (*) 150 - 400 K/uL   Neutrophils Relative % 72  43 - 77 %   Neutro Abs 4.9  1.7 - 7.7 K/uL   Lymphocytes Relative 19  12 - 46 %   Lymphs Abs 1.3  0.7 - 4.0 K/uL   Monocytes Relative 8  3 - 12 %   Monocytes Absolute 0.5  0.1 - 1.0 K/uL   Eosinophils Relative 1  0 - 5 %   Eosinophils Absolute 0.1  0.0 - 0.7 K/uL   Basophils Relative 0  0 - 1 %   Basophils Absolute 0.0  0.0 - 0.1 K/uL  COMPREHENSIVE METABOLIC PANEL     Status: Abnormal   Collection Time    11/15/13  2:55 PM      Result Value Ref Range   Sodium 143  137 - 147 mEq/L   Potassium 3.9  3.7 - 5.3 mEq/L   Chloride 107  96 - 112 mEq/L   CO2 21  19 - 32 mEq/L   Glucose, Bld 200 (*) 70 - 99 mg/dL   BUN 27 (*) 6 - 23 mg/dL   Creatinine, Ser 1.20  0.50 - 1.35 mg/dL   Calcium 9.3  8.4 - 10.5 mg/dL   Total Protein 6.6  6.0 - 8.3 g/dL   Albumin 3.1 (*) 3.5 - 5.2 g/dL   AST 83 (*) 0 - 37 U/L   ALT 61 (*) 0 - 53 U/L   Alkaline Phosphatase 99  39 - 117 U/L   Total Bilirubin 0.6  0.3 - 1.2 mg/dL   GFR calc non Af Amer 53 (*) >90 mL/min   GFR calc Af Amer 61 (*) >90 mL/min   Comment: (NOTE)     The eGFR has been calculated using the CKD EPI equation.     This calculation has not been validated in all clinical situations.     eGFR's persistently <90 mL/min signify possible Chronic Kidney     Disease.  LIPASE, BLOOD     Status: Abnormal   Collection Time    11/15/13  2:55 PM      Result Value Ref Range   Lipase 107 (*) 11 - 59 U/L  PROTIME-INR     Status: Abnormal   Collection Time    11/15/13  2:55 PM      Result Value Ref Range   Prothrombin Time 21.6 (*) 11.6 - 15.2 seconds   INR 1.94 (*) 0.00 - 1.49  PREPARE RBC (CROSSMATCH)     Status: None   Collection  Time    11/15/13  4:00 PM      Result Value Ref Range   Order Confirmation ORDER PROCESSED BY BLOOD BANK    I-STAT CHEM 8, ED     Status: Abnormal   Collection Time    11/15/13  4:08 PM      Result Value Ref Range   Sodium 147  137 - 147 mEq/L   Potassium 3.8  3.7 - 5.3 mEq/L   Chloride 111  96 - 112 mEq/L   BUN 23  6 - 23 mg/dL   Creatinine, Ser 1.20  0.50 - 1.35 mg/dL   Glucose, Bld 143 (*) 70 - 99 mg/dL   Calcium, Ion 1.20  1.13 - 1.30 mmol/L   TCO2 21  0 - 100 mmol/L   Hemoglobin 7.5 (*) 13.0 - 17.0 g/dL   HCT 22.0 (*) 39.0 - 52.0 %  I-STAT CG4 LACTIC ACID, ED     Status: None   Collection Time    11/15/13  4:08 PM      Result Value Ref Range   Lactic Acid, Venous 1.39  0.5 - 2.2 mmol/L   Dg Pelvis Portable  11/15/2013   CLINICAL DATA:  Pt trampled by cow, pain mostly in right hip area, low bp  EXAM: PORTABLE PELVIS 1-2 VIEWS  COMPARISON:  None.  FINDINGS: An area of cortical irregularity, with a possible avulsion component, is appreciated along the lateral aspect of the right iliac wing. There is overlying soft tissue swelling, concerning for hematoma.  A nondisplaced fracture with impaction is appreciated along the distal aspect of the superior pubic ramus on the right.  An oblique lucency projects along the body of the superior pubic ramus on the left differential considerations Mach line versus fracture.  Degenerative changes appreciated within the lower lumbar spine and right and left hips. Dystrophic calcifications overlying the pubis region on the left.  Bones osteopenic.  IMPRESSION: 1. Superior pubic ramus fracture on the right 2. Findings concerning for avulsion fracture lateral aspect of the iliac wing on the right. With findings also concerning for an overlying hematoma. 3. These results were called by telephone at the time of interpretation on 11/15/2013 at 4:33 PM to Dr. Leonard Schwartz , who verbally acknowledged these results. 4. Lucency of the body of the superior pubic ramus  on the left fracture versus Mach line   Electronically Signed   By: Marion Downer.D.  On: 11/15/2013 16:35   Dg Chest Port 1 View  11/15/2013   CLINICAL DATA:  Pt trampled by cow, pain mostly in right hip area, low bp  EXAM: PORTABLE CHEST - 1 VIEW  COMPARISON:  CT ANGIO CHEST W/CM &/OR WO/CM dated 10/03/2012; DG CHEST 2 VIEW dated 07/07/2007  FINDINGS: Low lung volumes. Cardiac silhouette enlarged. Aorta tortuous and ectatic. Lungs are clear. Degenerative changes right and left shoulders osseous structures otherwise unremarkable. There is no evidence of a pneumothorax.  IMPRESSION: No acute cardiopulmonary disease.   Electronically Signed   By: Margaree Mackintosh M.D.   On: 11/15/2013 16:25    Review of Systems  Unable to perform ROS: acuity of condition    Blood pressure 87/54, pulse 85, temperature 97.9 F (36.6 C), temperature source Oral, resp. rate 20, height '5\' 8"'  (1.727 m), weight 179 lb (81.194 kg), SpO2 99.00%. Physical Exam  Constitutional: He appears well-developed and well-nourished. No distress.  HENT:  Head: Normocephalic and atraumatic.  Right Ear: External ear normal.  Left Ear: External ear normal.  Nose: Nose normal.  Mouth/Throat: Oropharynx is clear and moist. No oropharyngeal exudate.  Eyes: EOM are normal. Pupils are equal, round, and reactive to light. Right eye exhibits no discharge. Left eye exhibits no discharge. No scleral icterus.  Neck: Normal range of motion. Neck supple. No tracheal deviation present.  No posterior midline tenderness  Cardiovascular: Normal rate and intact distal pulses.   No murmur heard. irregular  Respiratory: Breath sounds normal. No stridor. No respiratory distress. He has no wheezes. He has no rales. He exhibits no tenderness.  GI: Soft. He exhibits no distension. There is no tenderness. There is no rebound and no guarding.  Pelvic tenderness  Musculoskeletal:       Arms:      Legs: Large hematoma right upper medial thigh, abrasion  L forearm  Neurological: He is alert. He displays no tremor. He exhibits normal muscle tone. He displays no seizure activity. GCS eye subscore is 4. GCS verbal subscore is 5. GCS motor subscore is 6.  MAE, strength exam limited RLE due to pain     Assessment/Plan Stepped on by cow. Pelvic FXs with hypotension. PRBC and FFP resuscitation for hemorrhagic shock. Vitamin K and K centra to reverse coumadin.FAST neg.Once stabilized will take to CT.  Critical care 74mnutes.  Abbye Lao E 11/15/2013, 4:52 PM

## 2013-11-15 NOTE — Progress Notes (Signed)
Chaplain responded to level two trauma, which was then upgraded to a level one. Chaplain spoke with daughter who stated her father "runs circles around them," and stays very active. She seemed calm and did not have pastoral needs at this time.

## 2013-11-15 NOTE — ED Notes (Signed)
Dr Audie Pinto at bedside

## 2013-11-15 NOTE — ED Notes (Signed)
Pt in stating [place], states it knocked him down and landed on his right hip, denies pain but states he cannot stand on it, states the cow stepped on his right thigh, bruising to area, denies hitting head or injuring head, alert and oriented, c/o burning to right thigh area, pt takes coumadin

## 2013-11-16 ENCOUNTER — Encounter (HOSPITAL_COMMUNITY): Payer: Self-pay | Admitting: *Deleted

## 2013-11-16 DIAGNOSIS — T794XXA Traumatic shock, initial encounter: Secondary | ICD-10-CM | POA: Diagnosis not present

## 2013-11-16 DIAGNOSIS — S329XXA Fracture of unspecified parts of lumbosacral spine and pelvis, initial encounter for closed fracture: Secondary | ICD-10-CM | POA: Diagnosis not present

## 2013-11-16 LAB — PREPARE FRESH FROZEN PLASMA
UNIT DIVISION: 0
Unit division: 0
Unit division: 0
Unit division: 0

## 2013-11-16 LAB — CBC
HCT: 21.6 % — ABNORMAL LOW (ref 39.0–52.0)
HCT: 22.9 % — ABNORMAL LOW (ref 39.0–52.0)
HCT: 24.5 % — ABNORMAL LOW (ref 39.0–52.0)
HEMATOCRIT: 22.9 % — AB (ref 39.0–52.0)
HEMATOCRIT: 22.2 % — AB (ref 39.0–52.0)
HEMOGLOBIN: 7.4 g/dL — AB (ref 13.0–17.0)
HEMOGLOBIN: 7.6 g/dL — AB (ref 13.0–17.0)
HEMOGLOBIN: 7.3 g/dL — AB (ref 13.0–17.0)
Hemoglobin: 7.9 g/dL — ABNORMAL LOW (ref 13.0–17.0)
Hemoglobin: 8.2 g/dL — ABNORMAL LOW (ref 13.0–17.0)
MCH: 27.4 pg (ref 26.0–34.0)
MCH: 27.5 pg (ref 26.0–34.0)
MCH: 27.9 pg (ref 26.0–34.0)
MCH: 28.1 pg (ref 26.0–34.0)
MCH: 28.2 pg (ref 26.0–34.0)
MCHC: 33.2 g/dL (ref 30.0–36.0)
MCHC: 33.3 g/dL (ref 30.0–36.0)
MCHC: 33.5 g/dL (ref 30.0–36.0)
MCHC: 33.8 g/dL (ref 30.0–36.0)
MCHC: 34.5 g/dL (ref 30.0–36.0)
MCV: 81.8 fL (ref 78.0–100.0)
MCV: 82.5 fL (ref 78.0–100.0)
MCV: 82.7 fL (ref 78.0–100.0)
MCV: 83.1 fL (ref 78.0–100.0)
MCV: 83.3 fL (ref 78.0–100.0)
PLATELETS: 73 10*3/uL — AB (ref 150–400)
Platelets: 72 10*3/uL — ABNORMAL LOW (ref 150–400)
Platelets: 82 10*3/uL — ABNORMAL LOW (ref 150–400)
Platelets: 96 10*3/uL — ABNORMAL LOW (ref 150–400)
Platelets: 96 10*3/uL — ABNORMAL LOW (ref 150–400)
RBC: 2.6 MIL/uL — AB (ref 4.22–5.81)
RBC: 2.69 MIL/uL — AB (ref 4.22–5.81)
RBC: 2.77 MIL/uL — ABNORMAL LOW (ref 4.22–5.81)
RBC: 2.8 MIL/uL — ABNORMAL LOW (ref 4.22–5.81)
RBC: 2.94 MIL/uL — AB (ref 4.22–5.81)
RDW: 16.2 % — AB (ref 11.5–15.5)
RDW: 16.4 % — ABNORMAL HIGH (ref 11.5–15.5)
RDW: 16.7 % — AB (ref 11.5–15.5)
RDW: 16.7 % — ABNORMAL HIGH (ref 11.5–15.5)
RDW: 17.1 % — ABNORMAL HIGH (ref 11.5–15.5)
WBC: 6.2 10*3/uL (ref 4.0–10.5)
WBC: 6.8 10*3/uL (ref 4.0–10.5)
WBC: 6.9 10*3/uL (ref 4.0–10.5)
WBC: 7.8 10*3/uL (ref 4.0–10.5)
WBC: 9.3 10*3/uL (ref 4.0–10.5)

## 2013-11-16 LAB — BASIC METABOLIC PANEL
BUN: 25 mg/dL — ABNORMAL HIGH (ref 6–23)
CHLORIDE: 110 meq/L (ref 96–112)
CO2: 19 mEq/L (ref 19–32)
Calcium: 7.8 mg/dL — ABNORMAL LOW (ref 8.4–10.5)
Creatinine, Ser: 1.06 mg/dL (ref 0.50–1.35)
GFR calc Af Amer: 71 mL/min — ABNORMAL LOW (ref >90)
GFR calc non Af Amer: 61 mL/min — ABNORMAL LOW (ref >90)
GLUCOSE: 165 mg/dL — AB (ref 70–99)
POTASSIUM: 4.2 meq/L (ref 3.7–5.3)
Sodium: 142 mEq/L (ref 137–147)

## 2013-11-16 LAB — PROTIME-INR
INR: 1.29 (ref 0.00–1.49)
INR: 1.15 (ref 0.00–1.49)
Prothrombin Time: 15.8 seconds — ABNORMAL HIGH (ref 11.6–15.2)
Prothrombin Time: 14.5 seconds (ref 11.6–15.2)

## 2013-11-16 MED ORDER — SERTRALINE HCL 50 MG PO TABS
50.0000 mg | ORAL_TABLET | Freq: Every day | ORAL | Status: DC
  Administered 2013-11-16 – 2013-11-21 (×6): 50 mg via ORAL
  Filled 2013-11-16 (×6): qty 1

## 2013-11-16 NOTE — Progress Notes (Signed)
Subjective: Doing well this am, says he feels like he needs to pass gas  Objective: Vital signs in last 24 hours: Temp:  [97.4 F (36.3 C)-98.3 F (36.8 C)] 97.7 F (36.5 C) (04/04 0800) Pulse Rate:  [60-109] 78 (04/04 0900) Resp:  [9-26] 26 (04/04 0900) BP: (70-122)/(29-94) 113/67 mmHg (04/04 0900) SpO2:  [89 %-100 %] 92 % (04/04 0900) Arterial Line BP: (121-147)/(54-62) 132/60 mmHg (04/04 0900) FiO2 (%):  [28 %] 28 % (04/03 2024) Weight:  [179 lb (81.194 kg)] 179 lb (81.194 kg) (04/03 1542)    Intake/Output from previous day: 04/03 0701 - 04/04 0700 In: 7501 [I.V.:5715; Blood:1786] Out: 50 [Urine:50] Intake/Output this shift: Total I/O In: 95 [P.O.:20; I.V.:75] Out: 320 [Urine:320]  General appearance: no distress Resp: clear to auscultation bilaterally Cardio: regular rate and rhythm GI: soft mild distention some bs No left ankle (or other extremity) tenderness  Lab Results:   Recent Labs  11/16/13 0130 11/16/13 0620  WBC 6.2 6.8  HGB 7.9* 7.4*  HCT 22.9* 22.2*  PLT 72* 82*   BMET  Recent Labs  11/15/13 1455 11/15/13 1608 11/16/13 0620  NA 143 147 142  K 3.9 3.8 4.2  CL 107 111 110  CO2 21  --  19  GLUCOSE 200* 143* 165*  BUN 27* 23 25*  CREATININE 1.20 1.20 1.06  CALCIUM 9.3  --  7.8*   PT/INR  Recent Labs  11/15/13 1830 11/16/13 0620  LABPROT 18.0* 15.8*  INR 1.53* 1.29   ABG No results found for this basename: PHART, PCO2, PO2, HCO3,  in the last 72 hours  Studies/Results: Ct Abdomen Pelvis W Contrast  11/15/2013   CLINICAL DATA:  Recent traumatic injury, stepped on by a cow with pelvic pain  EXAM: CT ABDOMEN AND PELVIS WITH CONTRAST  TECHNIQUE: Multidetector CT imaging of the abdomen and pelvis was performed using the standard protocol following bolus administration of intravenous contrast.  CONTRAST:  146mL OMNIPAQUE IOHEXOL 300 MG/ML  SOLN  COMPARISON:  10/03/2012, 06/26/2007  FINDINGS: Lung bases are free of acute infiltrate or  sizable effusion.  The gallbladder is well distended with multiple small gallstones within. The liver demonstrates a rounded area of decreased attenuation along the lateral aspect of the right lobe measuring 2.2 cm. This may represent a cyst but was not present on a recent exam dated 10/03/2012. Given the recent injury to an adjacent perihepatic fluid would be difficult to exclude a small hepatic laceration in this region. No definitive rib fractures noted in this region.  The spleen, adrenal glands and pancreas are within normal limits with the exception of a small cyst within the body of the pancreas. The kidneys demonstrate multiple cysts bilaterally. No findings to suggest complicated cysts are identified. Delayed images through the kidneys demonstrate normal excretion of contrast material.  In the pelvis there is a large hematoma identified along the lateral pelvic wall on the right. It measures approximately 15 x 5.5 cm in greatest AP and transverse dimensions and extends for approximately be 16 cm 10 cm in the craniocaudad plane. There are areas of the active extravasation within this hematoma in the midportion at the level of the acetabulum. This displaces the urinary bladder which is decompressed by Foley catheter. Multiple prostate calcifications are identified. Comminuted fracture of the right iliac wing is noted with enlargement of the iliacus muscle on the right consistent with intramuscular hematoma. No active extravasation is noted in this region. Diffuse prostatic calcifications are noted. There is a  superior pubic ramus fracture identified on the right as well. No other definitive fractures are seen.  IMPRESSION: Changes consistent with a recent injury with fractures of the right iliac wing and right superior pubic ramus. There associated muscular hematoma within the right iliacus muscle as well as a large pelvic hematoma with active extravasation as described.  Multiple cystic lesions within the  kidneys and pancreas.  Cholelithiasis.  Hypodensity within the liver with adjacent perihepatic fluid. Given the history a would be difficult to exclude a small laceration in this region although no active extravasation is seen.  Critical Value/emergent results were discussed in person at the time of exam performance on 11/15/2013 at 530 PM to Dr. Georganna Skeans, who verbally acknowledged these results.   Electronically Signed   By: Inez Catalina M.D.   On: 11/15/2013 18:10   Ir Angiogram Pelvis Selective Or Supraselective  11/16/2013   CLINICAL DATA:  Traumatic right pelvic iliac and rami fractures with a large pelvic retroperitoneal hematoma and active bleeding by CT.  EXAM: ULTRASOUND GUIDANCE FOR VASCULAR ACCESS  PELVIC FLUSH AORTOGRAM  SELECTIVE RIGHT INTERNAL ILIAC, INFERIOR GLUTEAL, PUDENDAL, AND OBTURATOR ARTERIAL CATHETERIZATIONS AND ANGIOGRAMS  PERIPHERAL RIGHT OBTURATOR ARTERY PARTICLE AND COIL EMBOLIZATION FOR ACTIVE BLEEDING  Date:  4/3/20154/10/2013 11:28 PM  Radiologist:  Jerilynn Mages. Daryll Brod, MD  Guidance:  Ultrasound fluoroscopic  FLUOROSCOPY TIME:  9 min 6 seconds  MEDICATIONS AND MEDICAL HISTORY: 1 mg Versed, 100 mcg fentanyl  ANESTHESIA/SEDATION: 55 min  CONTRAST:  147mL OMNIPAQUE IOHEXOL 300 MG/ML  SOLN  COMPLICATIONS: No immediate  PROCEDURE: Informed consent was obtained from the patient following explanation of the procedure, risks, benefits and alternatives. The patient understands, agrees and consents for the procedure. All questions were addressed. A time out was performed.  Maximal barrier sterile technique utilized including caps, mask, sterile gowns, sterile gloves, large sterile drape, hand hygiene, and ChloraPrep.  Under sterile conditions and local anesthesia, ultrasound micropuncture access was performed of the left common femoral artery. Guidewire inserted followed by a 5 fr sheath. Pigtail catheter advanced to the aortic bifurcation. Flush pelvic angiogram performed.  Pelvic angiogram:  Diffuse iliac calcific atherosclerosis. The distal aortic bifurcation and pelvic iliac vessels are widely patent. The common, internal, and external iliac arteries are patent. Visualized femoral vessels are patent in the inguinal regions. In the right hemipelvis, there is contrast extravasation compatible with active arterial bleeding from a small branch originating peripherally off of the internal iliac vasculature.  For further investigation, catheter was exchanged for a C2 catheter. This catheter was advanced over the bifurcation into the right internal iliac artery. Selective right internal iliac angiogram performed.  Right Internal iliac artery: Right internal iliac artery is patent. Anterior and posterior divisions are patent. Active arterial bleeding demonstrated in the inferior right hemipelvis from a small peripheral branch either from the obturator or pudendal arteries.  From this location, a Renegade micro catheter was advanced through the C2 catheter over a micro glidewire. Initially the catheter was advanced into the right inferior gluteal artery. Selective angiogram of the inferior gluteal artery did not demonstrate any active bleeding.  Micro catheter was retracted and advanced into the internal pudendal artery. Selective angiogram demonstrates patency of the internal pudendal artery without active bleeding.  Catheter was retracted and advanced into the right obturator artery. Selective right obturator angiogram demonstrates active bleeding from a small branch off of the right obturator artery along the right superior ramus. This correlates with the active bleeding site by CT.  Embolization: From this peripheral location within the right obturator artery, initially Gel-Foam embolization was performed along the right superior ramus fracture. Additionally, 4 3 mm x 6cm Interlock micro coils were deployed peripherally in the right obturator artery across the small bleeding obturator branch.  Final post  embolization angiogram demonstrates occlusion of the peripheral obturarot artery without any additional active arterial bleeding.  Micro catheter was retracted into the internal iliac artery. Additional internal iliac angiogram demonstrates no additional active bleeding site. Micro catheter was removed. Additional right internal iliac angiogram performed through the 5 French catheter. Again, no additional right hemipelvis bleeding site.  Access was removed. Hemostasis obtained with Exoseal device at the left common femoral artery access site. Patient tolerated the procedure well. No immediate complication.  IMPRESSION: Right hemipelvis active arterial bleeding from a small peripheral branch off of the right obturator artery at the level of the right superior ramus fracture. This was successfully embolized with Gelfoam and four 3 mm micro coils peripherally within the right obturator artery.  No additional right hemipelvis active bleeding site demonstrated post embolization.  Findings discussed with Dr. Grandville Silos following the procedure.   Electronically Signed   By: Daryll Brod M.D.   On: 11/16/2013 09:30   Ir Angiogram Selective Each Additional Vessel  11/16/2013   CLINICAL DATA:  Traumatic right pelvic iliac and rami fractures with a large pelvic retroperitoneal hematoma and active bleeding by CT.  EXAM: ULTRASOUND GUIDANCE FOR VASCULAR ACCESS  PELVIC FLUSH AORTOGRAM  SELECTIVE RIGHT INTERNAL ILIAC, INFERIOR GLUTEAL, PUDENDAL, AND OBTURATOR ARTERIAL CATHETERIZATIONS AND ANGIOGRAMS  PERIPHERAL RIGHT OBTURATOR ARTERY PARTICLE AND COIL EMBOLIZATION FOR ACTIVE BLEEDING  Date:  4/3/20154/10/2013 11:28 PM  Radiologist:  Jerilynn Mages. Daryll Brod, MD  Guidance:  Ultrasound fluoroscopic  FLUOROSCOPY TIME:  9 min 6 seconds  MEDICATIONS AND MEDICAL HISTORY: 1 mg Versed, 100 mcg fentanyl  ANESTHESIA/SEDATION: 55 min  CONTRAST:  154mL OMNIPAQUE IOHEXOL 300 MG/ML  SOLN  COMPLICATIONS: No immediate  PROCEDURE: Informed consent was  obtained from the patient following explanation of the procedure, risks, benefits and alternatives. The patient understands, agrees and consents for the procedure. All questions were addressed. A time out was performed.  Maximal barrier sterile technique utilized including caps, mask, sterile gowns, sterile gloves, large sterile drape, hand hygiene, and ChloraPrep.  Under sterile conditions and local anesthesia, ultrasound micropuncture access was performed of the left common femoral artery. Guidewire inserted followed by a 5 fr sheath. Pigtail catheter advanced to the aortic bifurcation. Flush pelvic angiogram performed.  Pelvic angiogram: Diffuse iliac calcific atherosclerosis. The distal aortic bifurcation and pelvic iliac vessels are widely patent. The common, internal, and external iliac arteries are patent. Visualized femoral vessels are patent in the inguinal regions. In the right hemipelvis, there is contrast extravasation compatible with active arterial bleeding from a small branch originating peripherally off of the internal iliac vasculature.  For further investigation, catheter was exchanged for a C2 catheter. This catheter was advanced over the bifurcation into the right internal iliac artery. Selective right internal iliac angiogram performed.  Right Internal iliac artery: Right internal iliac artery is patent. Anterior and posterior divisions are patent. Active arterial bleeding demonstrated in the inferior right hemipelvis from a small peripheral branch either from the obturator or pudendal arteries.  From this location, a Renegade micro catheter was advanced through the C2 catheter over a micro glidewire. Initially the catheter was advanced into the right inferior gluteal artery. Selective angiogram of the inferior gluteal artery did not demonstrate any active  bleeding.  Micro catheter was retracted and advanced into the internal pudendal artery. Selective angiogram demonstrates patency of the  internal pudendal artery without active bleeding.  Catheter was retracted and advanced into the right obturator artery. Selective right obturator angiogram demonstrates active bleeding from a small branch off of the right obturator artery along the right superior ramus. This correlates with the active bleeding site by CT.  Embolization: From this peripheral location within the right obturator artery, initially Gel-Foam embolization was performed along the right superior ramus fracture. Additionally, 4 3 mm x 6cm Interlock micro coils were deployed peripherally in the right obturator artery across the small bleeding obturator branch.  Final post embolization angiogram demonstrates occlusion of the peripheral obturarot artery without any additional active arterial bleeding.  Micro catheter was retracted into the internal iliac artery. Additional internal iliac angiogram demonstrates no additional active bleeding site. Micro catheter was removed. Additional right internal iliac angiogram performed through the 5 French catheter. Again, no additional right hemipelvis bleeding site.  Access was removed. Hemostasis obtained with Exoseal device at the left common femoral artery access site. Patient tolerated the procedure well. No immediate complication.  IMPRESSION: Right hemipelvis active arterial bleeding from a small peripheral branch off of the right obturator artery at the level of the right superior ramus fracture. This was successfully embolized with Gelfoam and four 3 mm micro coils peripherally within the right obturator artery.  No additional right hemipelvis active bleeding site demonstrated post embolization.  Findings discussed with Dr. Grandville Silos following the procedure.   Electronically Signed   By: Daryll Brod M.D.   On: 11/16/2013 09:30   Ir Angiogram Selective Each Additional Vessel  11/16/2013   CLINICAL DATA:  Traumatic right pelvic iliac and rami fractures with a large pelvic retroperitoneal hematoma  and active bleeding by CT.  EXAM: ULTRASOUND GUIDANCE FOR VASCULAR ACCESS  PELVIC FLUSH AORTOGRAM  SELECTIVE RIGHT INTERNAL ILIAC, INFERIOR GLUTEAL, PUDENDAL, AND OBTURATOR ARTERIAL CATHETERIZATIONS AND ANGIOGRAMS  PERIPHERAL RIGHT OBTURATOR ARTERY PARTICLE AND COIL EMBOLIZATION FOR ACTIVE BLEEDING  Date:  4/3/20154/10/2013 11:28 PM  Radiologist:  Jerilynn Mages. Daryll Brod, MD  Guidance:  Ultrasound fluoroscopic  FLUOROSCOPY TIME:  9 min 6 seconds  MEDICATIONS AND MEDICAL HISTORY: 1 mg Versed, 100 mcg fentanyl  ANESTHESIA/SEDATION: 55 min  CONTRAST:  126mL OMNIPAQUE IOHEXOL 300 MG/ML  SOLN  COMPLICATIONS: No immediate  PROCEDURE: Informed consent was obtained from the patient following explanation of the procedure, risks, benefits and alternatives. The patient understands, agrees and consents for the procedure. All questions were addressed. A time out was performed.  Maximal barrier sterile technique utilized including caps, mask, sterile gowns, sterile gloves, large sterile drape, hand hygiene, and ChloraPrep.  Under sterile conditions and local anesthesia, ultrasound micropuncture access was performed of the left common femoral artery. Guidewire inserted followed by a 5 fr sheath. Pigtail catheter advanced to the aortic bifurcation. Flush pelvic angiogram performed.  Pelvic angiogram: Diffuse iliac calcific atherosclerosis. The distal aortic bifurcation and pelvic iliac vessels are widely patent. The common, internal, and external iliac arteries are patent. Visualized femoral vessels are patent in the inguinal regions. In the right hemipelvis, there is contrast extravasation compatible with active arterial bleeding from a small branch originating peripherally off of the internal iliac vasculature.  For further investigation, catheter was exchanged for a C2 catheter. This catheter was advanced over the bifurcation into the right internal iliac artery. Selective right internal iliac angiogram performed.  Right Internal iliac  artery: Right internal iliac artery is  patent. Anterior and posterior divisions are patent. Active arterial bleeding demonstrated in the inferior right hemipelvis from a small peripheral branch either from the obturator or pudendal arteries.  From this location, a Renegade micro catheter was advanced through the C2 catheter over a micro glidewire. Initially the catheter was advanced into the right inferior gluteal artery. Selective angiogram of the inferior gluteal artery did not demonstrate any active bleeding.  Micro catheter was retracted and advanced into the internal pudendal artery. Selective angiogram demonstrates patency of the internal pudendal artery without active bleeding.  Catheter was retracted and advanced into the right obturator artery. Selective right obturator angiogram demonstrates active bleeding from a small branch off of the right obturator artery along the right superior ramus. This correlates with the active bleeding site by CT.  Embolization: From this peripheral location within the right obturator artery, initially Gel-Foam embolization was performed along the right superior ramus fracture. Additionally, 4 3 mm x 6cm Interlock micro coils were deployed peripherally in the right obturator artery across the small bleeding obturator branch.  Final post embolization angiogram demonstrates occlusion of the peripheral obturarot artery without any additional active arterial bleeding.  Micro catheter was retracted into the internal iliac artery. Additional internal iliac angiogram demonstrates no additional active bleeding site. Micro catheter was removed. Additional right internal iliac angiogram performed through the 5 French catheter. Again, no additional right hemipelvis bleeding site.  Access was removed. Hemostasis obtained with Exoseal device at the left common femoral artery access site. Patient tolerated the procedure well. No immediate complication.  IMPRESSION: Right hemipelvis active  arterial bleeding from a small peripheral branch off of the right obturator artery at the level of the right superior ramus fracture. This was successfully embolized with Gelfoam and four 3 mm micro coils peripherally within the right obturator artery.  No additional right hemipelvis active bleeding site demonstrated post embolization.  Findings discussed with Dr. Grandville Silos following the procedure.   Electronically Signed   By: Daryll Brod M.D.   On: 11/16/2013 09:30   Ir Angiogram Selective Each Additional Vessel  11/16/2013   CLINICAL DATA:  Traumatic right pelvic iliac and rami fractures with a large pelvic retroperitoneal hematoma and active bleeding by CT.  EXAM: ULTRASOUND GUIDANCE FOR VASCULAR ACCESS  PELVIC FLUSH AORTOGRAM  SELECTIVE RIGHT INTERNAL ILIAC, INFERIOR GLUTEAL, PUDENDAL, AND OBTURATOR ARTERIAL CATHETERIZATIONS AND ANGIOGRAMS  PERIPHERAL RIGHT OBTURATOR ARTERY PARTICLE AND COIL EMBOLIZATION FOR ACTIVE BLEEDING  Date:  4/3/20154/10/2013 11:28 PM  Radiologist:  Jerilynn Mages. Daryll Brod, MD  Guidance:  Ultrasound fluoroscopic  FLUOROSCOPY TIME:  9 min 6 seconds  MEDICATIONS AND MEDICAL HISTORY: 1 mg Versed, 100 mcg fentanyl  ANESTHESIA/SEDATION: 55 min  CONTRAST:  119mL OMNIPAQUE IOHEXOL 300 MG/ML  SOLN  COMPLICATIONS: No immediate  PROCEDURE: Informed consent was obtained from the patient following explanation of the procedure, risks, benefits and alternatives. The patient understands, agrees and consents for the procedure. All questions were addressed. A time out was performed.  Maximal barrier sterile technique utilized including caps, mask, sterile gowns, sterile gloves, large sterile drape, hand hygiene, and ChloraPrep.  Under sterile conditions and local anesthesia, ultrasound micropuncture access was performed of the left common femoral artery. Guidewire inserted followed by a 5 fr sheath. Pigtail catheter advanced to the aortic bifurcation. Flush pelvic angiogram performed.  Pelvic angiogram:  Diffuse iliac calcific atherosclerosis. The distal aortic bifurcation and pelvic iliac vessels are widely patent. The common, internal, and external iliac arteries are patent. Visualized femoral vessels are patent in  the inguinal regions. In the right hemipelvis, there is contrast extravasation compatible with active arterial bleeding from a small branch originating peripherally off of the internal iliac vasculature.  For further investigation, catheter was exchanged for a C2 catheter. This catheter was advanced over the bifurcation into the right internal iliac artery. Selective right internal iliac angiogram performed.  Right Internal iliac artery: Right internal iliac artery is patent. Anterior and posterior divisions are patent. Active arterial bleeding demonstrated in the inferior right hemipelvis from a small peripheral branch either from the obturator or pudendal arteries.  From this location, a Renegade micro catheter was advanced through the C2 catheter over a micro glidewire. Initially the catheter was advanced into the right inferior gluteal artery. Selective angiogram of the inferior gluteal artery did not demonstrate any active bleeding.  Micro catheter was retracted and advanced into the internal pudendal artery. Selective angiogram demonstrates patency of the internal pudendal artery without active bleeding.  Catheter was retracted and advanced into the right obturator artery. Selective right obturator angiogram demonstrates active bleeding from a small branch off of the right obturator artery along the right superior ramus. This correlates with the active bleeding site by CT.  Embolization: From this peripheral location within the right obturator artery, initially Gel-Foam embolization was performed along the right superior ramus fracture. Additionally, 4 3 mm x 6cm Interlock micro coils were deployed peripherally in the right obturator artery across the small bleeding obturator branch.  Final post  embolization angiogram demonstrates occlusion of the peripheral obturarot artery without any additional active arterial bleeding.  Micro catheter was retracted into the internal iliac artery. Additional internal iliac angiogram demonstrates no additional active bleeding site. Micro catheter was removed. Additional right internal iliac angiogram performed through the 5 French catheter. Again, no additional right hemipelvis bleeding site.  Access was removed. Hemostasis obtained with Exoseal device at the left common femoral artery access site. Patient tolerated the procedure well. No immediate complication.  IMPRESSION: Right hemipelvis active arterial bleeding from a small peripheral branch off of the right obturator artery at the level of the right superior ramus fracture. This was successfully embolized with Gelfoam and four 3 mm micro coils peripherally within the right obturator artery.  No additional right hemipelvis active bleeding site demonstrated post embolization.  Findings discussed with Dr. Grandville Silos following the procedure.   Electronically Signed   By: Daryll Brod M.D.   On: 11/16/2013 09:30   Ir Angiogram Follow Up Study  11/16/2013   CLINICAL DATA:  Traumatic right pelvic iliac and rami fractures with a large pelvic retroperitoneal hematoma and active bleeding by CT.  EXAM: ULTRASOUND GUIDANCE FOR VASCULAR ACCESS  PELVIC FLUSH AORTOGRAM  SELECTIVE RIGHT INTERNAL ILIAC, INFERIOR GLUTEAL, PUDENDAL, AND OBTURATOR ARTERIAL CATHETERIZATIONS AND ANGIOGRAMS  PERIPHERAL RIGHT OBTURATOR ARTERY PARTICLE AND COIL EMBOLIZATION FOR ACTIVE BLEEDING  Date:  4/3/20154/10/2013 11:28 PM  Radiologist:  Jerilynn Mages. Daryll Brod, MD  Guidance:  Ultrasound fluoroscopic  FLUOROSCOPY TIME:  9 min 6 seconds  MEDICATIONS AND MEDICAL HISTORY: 1 mg Versed, 100 mcg fentanyl  ANESTHESIA/SEDATION: 55 min  CONTRAST:  162mL OMNIPAQUE IOHEXOL 300 MG/ML  SOLN  COMPLICATIONS: No immediate  PROCEDURE: Informed consent was obtained from the  patient following explanation of the procedure, risks, benefits and alternatives. The patient understands, agrees and consents for the procedure. All questions were addressed. A time out was performed.  Maximal barrier sterile technique utilized including caps, mask, sterile gowns, sterile gloves, large sterile drape, hand hygiene, and ChloraPrep.  Under sterile  conditions and local anesthesia, ultrasound micropuncture access was performed of the left common femoral artery. Guidewire inserted followed by a 5 fr sheath. Pigtail catheter advanced to the aortic bifurcation. Flush pelvic angiogram performed.  Pelvic angiogram: Diffuse iliac calcific atherosclerosis. The distal aortic bifurcation and pelvic iliac vessels are widely patent. The common, internal, and external iliac arteries are patent. Visualized femoral vessels are patent in the inguinal regions. In the right hemipelvis, there is contrast extravasation compatible with active arterial bleeding from a small branch originating peripherally off of the internal iliac vasculature.  For further investigation, catheter was exchanged for a C2 catheter. This catheter was advanced over the bifurcation into the right internal iliac artery. Selective right internal iliac angiogram performed.  Right Internal iliac artery: Right internal iliac artery is patent. Anterior and posterior divisions are patent. Active arterial bleeding demonstrated in the inferior right hemipelvis from a small peripheral branch either from the obturator or pudendal arteries.  From this location, a Renegade micro catheter was advanced through the C2 catheter over a micro glidewire. Initially the catheter was advanced into the right inferior gluteal artery. Selective angiogram of the inferior gluteal artery did not demonstrate any active bleeding.  Micro catheter was retracted and advanced into the internal pudendal artery. Selective angiogram demonstrates patency of the internal pudendal  artery without active bleeding.  Catheter was retracted and advanced into the right obturator artery. Selective right obturator angiogram demonstrates active bleeding from a small branch off of the right obturator artery along the right superior ramus. This correlates with the active bleeding site by CT.  Embolization: From this peripheral location within the right obturator artery, initially Gel-Foam embolization was performed along the right superior ramus fracture. Additionally, 4 3 mm x 6cm Interlock micro coils were deployed peripherally in the right obturator artery across the small bleeding obturator branch.  Final post embolization angiogram demonstrates occlusion of the peripheral obturarot artery without any additional active arterial bleeding.  Micro catheter was retracted into the internal iliac artery. Additional internal iliac angiogram demonstrates no additional active bleeding site. Micro catheter was removed. Additional right internal iliac angiogram performed through the 5 French catheter. Again, no additional right hemipelvis bleeding site.  Access was removed. Hemostasis obtained with Exoseal device at the left common femoral artery access site. Patient tolerated the procedure well. No immediate complication.  IMPRESSION: Right hemipelvis active arterial bleeding from a small peripheral branch off of the right obturator artery at the level of the right superior ramus fracture. This was successfully embolized with Gelfoam and four 3 mm micro coils peripherally within the right obturator artery.  No additional right hemipelvis active bleeding site demonstrated post embolization.  Findings discussed with Dr. Grandville Silos following the procedure.   Electronically Signed   By: Daryll Brod M.D.   On: 11/16/2013 09:30   Dg Pelvis Portable  11/15/2013   CLINICAL DATA:  Pt trampled by cow, pain mostly in right hip area, low bp  EXAM: PORTABLE PELVIS 1-2 VIEWS  COMPARISON:  None.  FINDINGS: An area of  cortical irregularity, with a possible avulsion component, is appreciated along the lateral aspect of the right iliac wing. There is overlying soft tissue swelling, concerning for hematoma.  A nondisplaced fracture with impaction is appreciated along the distal aspect of the superior pubic ramus on the right.  An oblique lucency projects along the body of the superior pubic ramus on the left differential considerations Mach line versus fracture.  Degenerative changes appreciated within the lower lumbar  spine and right and left hips. Dystrophic calcifications overlying the pubis region on the left.  Bones osteopenic.  IMPRESSION: 1. Superior pubic ramus fracture on the right 2. Findings concerning for avulsion fracture lateral aspect of the iliac wing on the right. With findings also concerning for an overlying hematoma. 3. These results were called by telephone at the time of interpretation on 11/15/2013 at 4:33 PM to Dr. Leonard Schwartz , who verbally acknowledged these results. 4. Lucency of the body of the superior pubic ramus on the left fracture versus Mach line   Electronically Signed   By: Margaree Mackintosh M.D.   On: 11/15/2013 16:35   Ct Femur Right W Contrast  11/15/2013   CLINICAL DATA:  Recent traumatic injury. Patient's stepped on by a cow  EXAM: CT OF THE RIGHT LOWER EXTREMITY WITH CONTRAST  TECHNIQUE: Contiguous axial images were obtained to the right lower extremity from the pelvis to the level of the knee joint following contrast administration. Sagittal and coronal reconstructions were obtained.  CONTRAST:  11mL OMNIPAQUE IOHEXOL 300 MG/ML  SOLN  COMPARISON:  None.  FINDINGS: There again noted changes consistent with superior pubic ramus fracture on the right. The femur is well visualized and shows no evidence of acute fracture. The soft tissue structures shown the musculature of the thigh to be within normal limits. No definitive muscular hematomas are seen. Soft tissue fluid attenuation is identified  consistent with localized subcutaneous hematoma. No definitive active extravasation is noted however. The large pelvic hematoma is again identified and stable.  IMPRESSION: No evidence of femoral fracture.  Right superior pubic ramus fracture.  Soft tissue changes are noted in the medial thigh consistent with localized hemorrhage although no active extravasation is noted. No hematoma is noted within the musculature.   Electronically Signed   By: Inez Catalina M.D.   On: 11/15/2013 18:12   Ir US Guide Vasc Access Left  11/16/2013   CLINICAL DATA:  Traumatic right pelvic iliac and rami fractures with a large pelvic retroperitoneal hematoma and active bleeding by CT.  EXAM: ULTRASOUND GUIDANCE FOR VASCULAR ACCESS  PELVIC FLUSH AORTOGRAM  SELECTIVE RIGHT INTERNAL ILIAC, INFERIOR GLUTEAL, PUDENDAL, AND OBTURATOR ARTERIAL CATHETERIZATIONS AND ANGIOGRAMS  PERIPHERAL RIGHT OBTURATOR ARTERY PARTICLE AND COIL EMBOLIZATION FOR ACTIVE BLEEDING  Date:  4/3/20154/10/2013 11:28 PM  Radiologist:  Jerilynn Mages. Daryll Brod, MD  Guidance:  Ultrasound fluoroscopic  FLUOROSCOPY TIME:  9 min 6 seconds  MEDICATIONS AND MEDICAL HISTORY: 1 mg Versed, 100 mcg fentanyl  ANESTHESIA/SEDATION: 55 min  CONTRAST:  148mL OMNIPAQUE IOHEXOL 300 MG/ML  SOLN  COMPLICATIONS: No immediate  PROCEDURE: Informed consent was obtained from the patient following explanation of the procedure, risks, benefits and alternatives. The patient understands, agrees and consents for the procedure. All questions were addressed. A time out was performed.  Maximal barrier sterile technique utilized including caps, mask, sterile gowns, sterile gloves, large sterile drape, hand hygiene, and ChloraPrep.  Under sterile conditions and local anesthesia, ultrasound micropuncture access was performed of the left common femoral artery. Guidewire inserted followed by a 5 fr sheath. Pigtail catheter advanced to the aortic bifurcation. Flush pelvic angiogram performed.  Pelvic angiogram:  Diffuse iliac calcific atherosclerosis. The distal aortic bifurcation and pelvic iliac vessels are widely patent. The common, internal, and external iliac arteries are patent. Visualized femoral vessels are patent in the inguinal regions. In the right hemipelvis, there is contrast extravasation compatible with active arterial bleeding from a small branch originating peripherally off of the internal iliac  vasculature.  For further investigation, catheter was exchanged for a C2 catheter. This catheter was advanced over the bifurcation into the right internal iliac artery. Selective right internal iliac angiogram performed.  Right Internal iliac artery: Right internal iliac artery is patent. Anterior and posterior divisions are patent. Active arterial bleeding demonstrated in the inferior right hemipelvis from a small peripheral branch either from the obturator or pudendal arteries.  From this location, a Renegade micro catheter was advanced through the C2 catheter over a micro glidewire. Initially the catheter was advanced into the right inferior gluteal artery. Selective angiogram of the inferior gluteal artery did not demonstrate any active bleeding.  Micro catheter was retracted and advanced into the internal pudendal artery. Selective angiogram demonstrates patency of the internal pudendal artery without active bleeding.  Catheter was retracted and advanced into the right obturator artery. Selective right obturator angiogram demonstrates active bleeding from a small branch off of the right obturator artery along the right superior ramus. This correlates with the active bleeding site by CT.  Embolization: From this peripheral location within the right obturator artery, initially Gel-Foam embolization was performed along the right superior ramus fracture. Additionally, 4 3 mm x 6cm Interlock micro coils were deployed peripherally in the right obturator artery across the small bleeding obturator branch.  Final post  embolization angiogram demonstrates occlusion of the peripheral obturarot artery without any additional active arterial bleeding.  Micro catheter was retracted into the internal iliac artery. Additional internal iliac angiogram demonstrates no additional active bleeding site. Micro catheter was removed. Additional right internal iliac angiogram performed through the 5 French catheter. Again, no additional right hemipelvis bleeding site.  Access was removed. Hemostasis obtained with Exoseal device at the left common femoral artery access site. Patient tolerated the procedure well. No immediate complication.  IMPRESSION: Right hemipelvis active arterial bleeding from a small peripheral branch off of the right obturator artery at the level of the right superior ramus fracture. This was successfully embolized with Gelfoam and four 3 mm micro coils peripherally within the right obturator artery.  No additional right hemipelvis active bleeding site demonstrated post embolization.  Findings discussed with Dr. Grandville Silos following the procedure.   Electronically Signed   By: Daryll Brod M.D.   On: 11/16/2013 09:30   Dg Chest Port 1 View  11/15/2013   CLINICAL DATA:  Pt trampled by cow, pain mostly in right hip area, low bp  EXAM: PORTABLE CHEST - 1 VIEW  COMPARISON:  CT ANGIO CHEST W/CM &/OR WO/CM dated 10/03/2012; DG CHEST 2 VIEW dated 07/07/2007  FINDINGS: Low lung volumes. Cardiac silhouette enlarged. Aorta tortuous and ectatic. Lungs are clear. Degenerative changes right and left shoulders osseous structures otherwise unremarkable. There is no evidence of a pneumothorax.  IMPRESSION: No acute cardiopulmonary disease.   Electronically Signed   By: Margaree Mackintosh M.D.   On: 11/15/2013 16:25   [place] Guide Roadmapping  11/16/2013   CLINICAL DATA:  Traumatic right pelvic iliac and rami fractures with a large pelvic retroperitoneal hematoma and active bleeding by CT.  EXAM: ULTRASOUND  GUIDANCE FOR VASCULAR ACCESS  PELVIC FLUSH AORTOGRAM  SELECTIVE RIGHT INTERNAL ILIAC, INFERIOR GLUTEAL, PUDENDAL, AND OBTURATOR ARTERIAL CATHETERIZATIONS AND ANGIOGRAMS  PERIPHERAL RIGHT OBTURATOR ARTERY PARTICLE AND COIL EMBOLIZATION FOR ACTIVE BLEEDING  Date:  4/3/20154/10/2013 11:28 PM  Radiologist:  Jerilynn Mages. Daryll Brod, MD  Guidance:  Ultrasound fluoroscopic  FLUOROSCOPY TIME:  9 min 6 seconds  [place]  HISTORY: 1 mg Versed, 100 mcg fentanyl  ANESTHESIA/SEDATION: 55 min  CONTRAST:  153mL OMNIPAQUE IOHEXOL 300 MG/ML  SOLN  COMPLICATIONS: No immediate  PROCEDURE: Informed consent was obtained from the patient following explanation of the procedure, risks, benefits and alternatives. The patient understands, agrees and consents for the procedure. All questions were addressed. A time out was performed.  Maximal barrier sterile technique utilized including caps, mask, sterile gowns, sterile gloves, large sterile drape, hand hygiene, and ChloraPrep.  Under sterile conditions and local anesthesia, ultrasound micropuncture access was performed of the left common femoral artery. Guidewire inserted followed by a 5 fr sheath. Pigtail catheter advanced to the aortic bifurcation. Flush pelvic angiogram performed.  Pelvic angiogram: Diffuse iliac calcific atherosclerosis. The distal aortic bifurcation and pelvic iliac vessels are widely patent. The common, internal, and external iliac arteries are patent. Visualized femoral vessels are patent in the inguinal regions. In the right hemipelvis, there is contrast extravasation compatible with active arterial bleeding from a small branch originating peripherally off of the internal iliac vasculature.  For further investigation, catheter was exchanged for a C2 catheter. This catheter was advanced over the bifurcation into the right internal iliac artery. Selective right internal iliac angiogram performed.  Right Internal iliac artery: Right internal iliac artery is patent.  Anterior and posterior divisions are patent. Active arterial bleeding demonstrated in the inferior right hemipelvis from a small peripheral branch either from the obturator or pudendal arteries.  From this location, a Renegade micro catheter was advanced through the C2 catheter over a micro glidewire. Initially the catheter was advanced into the right inferior gluteal artery. Selective angiogram of the inferior gluteal artery did not demonstrate any active bleeding.  Micro catheter was retracted and advanced into the internal pudendal artery. Selective angiogram demonstrates patency of the internal pudendal artery without active bleeding.  Catheter was retracted and advanced into the right obturator artery. Selective right obturator angiogram demonstrates active bleeding from a small branch off of the right obturator artery along the right superior ramus. This correlates with the active bleeding site by CT.  Embolization: From this peripheral location within the right obturator artery, initially Gel-Foam embolization was performed along the right superior ramus fracture. Additionally, 4 3 mm x 6cm Interlock micro coils were deployed peripherally in the right obturator artery across the small bleeding obturator branch.  Final post embolization angiogram demonstrates occlusion of the peripheral obturarot artery without any additional active arterial bleeding.  Micro catheter was retracted into the internal iliac artery. Additional internal iliac angiogram demonstrates no additional active bleeding site. Micro catheter was removed. Additional right internal iliac angiogram performed through the 5 French catheter. Again, no additional right hemipelvis bleeding site.  Access was removed. Hemostasis obtained with Exoseal device at the left common femoral artery access site. Patient tolerated the procedure well. No immediate complication.  IMPRESSION: Right hemipelvis active arterial bleeding from a small peripheral branch  off of the right obturator artery at the level of the right superior ramus fracture. This was successfully embolized with Gelfoam and four 3 mm micro coils peripherally within the right obturator artery.  No additional right hemipelvis active bleeding site demonstrated post embolization.  Findings discussed with Dr. Grandville Silos following the procedure.   Electronically Signed   By: Daryll Brod M.D.   On: 11/16/2013 09:30    Anti-infectives: Anti-infectives   None      Assessment/Plan: S/p cow attack, pelvic embo  1. Cont current pain control 2. pulm toilet, can get oob, per  ortho wbat 3. Dc aline today 4. Continue foley due to hematoma and pelvic fracture 5. Abl anemia- will keep in icu and continue following hct today, check pt inr again later also 6. Hold pharm dvt proph, scds    [facility] 11/16/2013

## 2013-11-16 NOTE — Progress Notes (Signed)
Subjective: Traumatic pelvic injury last pm Rt obturator artery embolization with gelfoam and coils Pt doing well this am Stable; up in bed  Objective: Vital signs in last 24 hours: Temp:  [97.4 F (36.3 C)-98.3 F (36.8 C)] 97.7 F (36.5 C) (04/04 0800) Pulse Rate:  [60-109] 82 (04/04 0800) Resp:  [9-21] 18 (04/04 0800) BP: (70-122)/(29-94) 112/68 mmHg (04/04 0800) SpO2:  [89 %-100 %] 92 % (04/04 0800) Arterial Line BP: (121-147)/(54-62) 130/57 mmHg (04/04 0800) FiO2 (%):  [28 %] 28 % (04/03 2024) Weight:  [81.194 kg (179 lb)] 81.194 kg (179 lb) (04/03 1542)    Intake/Output from previous day: 04/03 0701 - 04/04 0700 In: 7501 [I.V.:5715; Blood:1786] Out: 50 [Urine:50] Intake/Output this shift: Total I/O In: 95 [P.O.:20; I.V.:75] Out: 320 [Urine:320]  PE:  Afeb; vss No complaints Pleasant Lt groin NT; no bleeding; no Hematoma Lt foot 2+ pulses H/H stable   Lab Results:   Recent Labs  11/16/13 0130 11/16/13 0620  WBC 6.2 6.8  HGB 7.9* 7.4*  HCT 22.9* 22.2*  PLT 72* 82*   BMET  Recent Labs  11/15/13 1455 11/15/13 1608 11/16/13 0620  NA 143 147 142  K 3.9 3.8 4.2  CL 107 111 110  CO2 21  --  19  GLUCOSE 200* 143* 165*  BUN 27* 23 25*  CREATININE 1.20 1.20 1.06  CALCIUM 9.3  --  7.8*   PT/INR  Recent Labs  11/15/13 1830 11/16/13 0620  LABPROT 18.0* 15.8*  INR 1.53* 1.29   ABG No results found for this basename: PHART, PCO2, PO2, HCO3,  in the last 72 hours  Studies/Results: Ct Abdomen Pelvis W Contrast  11/15/2013   CLINICAL DATA:  Recent traumatic injury, stepped on by Allen cow with pelvic pain  EXAM: CT ABDOMEN AND PELVIS WITH CONTRAST  TECHNIQUE: Multidetector CT imaging of the abdomen and pelvis was performed using the standard protocol following bolus administration of intravenous contrast.  CONTRAST:  123mL OMNIPAQUE IOHEXOL 300 MG/ML  SOLN  COMPARISON:  10/03/2012, 06/26/2007  FINDINGS: Lung bases are free of acute infiltrate or  sizable effusion.  The gallbladder is well distended with multiple small gallstones within. The liver demonstrates Allen rounded area of decreased attenuation along the lateral aspect of the right lobe measuring 2.2 cm. This may represent Allen cyst but was not present on Allen recent exam dated 10/03/2012. Given the recent injury to an adjacent perihepatic fluid would be difficult to exclude Allen small hepatic laceration in this region. No definitive rib fractures noted in this region.  The spleen, adrenal glands and pancreas are within normal limits with the exception of Allen small cyst within the body of the pancreas. The kidneys demonstrate multiple cysts bilaterally. No findings to suggest complicated cysts are identified. Delayed images through the kidneys demonstrate normal excretion of contrast material.  In the pelvis there is Allen large hematoma identified along the lateral pelvic wall on the right. It measures approximately 15 x 5.5 cm in greatest AP and transverse dimensions and extends for approximately be 16 cm 10 cm in the craniocaudad plane. There are areas of the active extravasation within this hematoma in the midportion at the level of the acetabulum. This displaces the urinary bladder which is decompressed by Foley catheter. Multiple prostate calcifications are identified. Comminuted fracture of the right iliac wing is noted with enlargement of the iliacus muscle on the right consistent with intramuscular hematoma. No active extravasation is noted in this region. Diffuse prostatic calcifications are noted.  There is Allen superior pubic ramus fracture identified on the right as well. No other definitive fractures are seen.  IMPRESSION: Changes consistent with Allen recent injury with fractures of the right iliac wing and right superior pubic ramus. There associated muscular hematoma within the right iliacus muscle as well as Allen large pelvic hematoma with active extravasation as described.  Multiple cystic lesions within the  kidneys and pancreas.  Cholelithiasis.  Hypodensity within the liver with adjacent perihepatic fluid. Given the history Allen would be difficult to exclude Allen small laceration in this region although no active extravasation is seen.  Critical Value/emergent results were discussed in person at the time of exam performance on 11/15/2013 at 530 PM to Dr. Georganna Skeans, who verbally acknowledged these results.   Electronically Signed   By: Inez Catalina M.D.   On: 11/15/2013 18:10   Ir Angiogram Pelvis Selective Or Supraselective  11/16/2013   CLINICAL DATA:  Traumatic right pelvic iliac and rami fractures with Allen large pelvic retroperitoneal hematoma and active bleeding by CT.  EXAM: ULTRASOUND GUIDANCE FOR VASCULAR ACCESS  PELVIC FLUSH AORTOGRAM  SELECTIVE RIGHT INTERNAL ILIAC, INFERIOR GLUTEAL, PUDENDAL, AND OBTURATOR ARTERIAL CATHETERIZATIONS AND ANGIOGRAMS  PERIPHERAL RIGHT OBTURATOR ARTERY PARTICLE AND COIL EMBOLIZATION FOR ACTIVE BLEEDING  Date:  4/3/20154/10/2013 11:28 PM  Radiologist:  Jerilynn Mages. Daryll Brod, MD  Guidance:  Ultrasound fluoroscopic  FLUOROSCOPY TIME:  9 min 6 seconds  MEDICATIONS AND MEDICAL HISTORY: 1 mg Versed, 100 mcg fentanyl  ANESTHESIA/SEDATION: 55 min  CONTRAST:  136mL OMNIPAQUE IOHEXOL 300 MG/ML  SOLN  COMPLICATIONS: No immediate  PROCEDURE: Informed consent was obtained from the patient following explanation of the procedure, risks, benefits and alternatives. The patient understands, agrees and consents for the procedure. All questions were addressed. Allen time out was performed.  Maximal barrier sterile technique utilized including caps, mask, sterile gowns, sterile gloves, large sterile drape, hand hygiene, and ChloraPrep.  Under sterile conditions and local anesthesia, ultrasound micropuncture access was performed of the left common femoral artery. Guidewire inserted followed by Allen 5 fr sheath. Pigtail catheter advanced to the aortic bifurcation. Flush pelvic angiogram performed.  Pelvic angiogram:  Diffuse iliac calcific atherosclerosis. The distal aortic bifurcation and pelvic iliac vessels are widely patent. The common, internal, and external iliac arteries are patent. Visualized femoral vessels are patent in the inguinal regions. In the right hemipelvis, there is contrast extravasation compatible with active arterial bleeding from Allen small branch originating peripherally off of the internal iliac vasculature.  For further investigation, catheter was exchanged for Allen C2 catheter. This catheter was advanced over the bifurcation into the right internal iliac artery. Selective right internal iliac angiogram performed.  Right Internal iliac artery: Right internal iliac artery is patent. Anterior and posterior divisions are patent. Active arterial bleeding demonstrated in the inferior right hemipelvis from Allen small peripheral branch either from the obturator or pudendal arteries.  From this location, Allen Renegade micro catheter was advanced through the C2 catheter over Allen micro glidewire. Initially the catheter was advanced into the right inferior gluteal artery. Selective angiogram of the inferior gluteal artery did not demonstrate any active bleeding.  Micro catheter was retracted and advanced into the internal pudendal artery. Selective angiogram demonstrates patency of the internal pudendal artery without active bleeding.  Catheter was retracted and advanced into the right obturator artery. Selective right obturator angiogram demonstrates active bleeding from Allen small branch off of the right obturator artery along the right superior ramus. This correlates with the active bleeding site  by CT.  Embolization: From this peripheral location within the right obturator artery, initially Gel-Foam embolization was performed along the right superior ramus fracture. Additionally, 4 3 mm x 6cm Interlock micro coils were deployed peripherally in the right obturator artery across the small bleeding obturator branch.  Final post  embolization angiogram demonstrates occlusion of the peripheral obturarot artery without any additional active arterial bleeding.  Micro catheter was retracted into the internal iliac artery. Additional internal iliac angiogram demonstrates no additional active bleeding site. Micro catheter was removed. Additional right internal iliac angiogram performed through the 5 French catheter. Again, no additional right hemipelvis bleeding site.  Access was removed. Hemostasis obtained with Exoseal device at the left common femoral artery access site. Patient tolerated the procedure well. No immediate complication.  IMPRESSION: Right hemipelvis active arterial bleeding from Allen small peripheral branch off of the right obturator artery at the level of the right superior ramus fracture. This was successfully embolized with Gelfoam and four 3 mm micro coils peripherally within the right obturator artery.  No additional right hemipelvis active bleeding site demonstrated post embolization.  Findings discussed with Dr. Grandville Silos following the procedure.   Electronically Signed   By: Daryll Brod M.D.   On: 11/16/2013 09:30   Ir Angiogram Selective Each Additional Vessel  11/16/2013   CLINICAL DATA:  Traumatic right pelvic iliac and rami fractures with Allen large pelvic retroperitoneal hematoma and active bleeding by CT.  EXAM: ULTRASOUND GUIDANCE FOR VASCULAR ACCESS  PELVIC FLUSH AORTOGRAM  SELECTIVE RIGHT INTERNAL ILIAC, INFERIOR GLUTEAL, PUDENDAL, AND OBTURATOR ARTERIAL CATHETERIZATIONS AND ANGIOGRAMS  PERIPHERAL RIGHT OBTURATOR ARTERY PARTICLE AND COIL EMBOLIZATION FOR ACTIVE BLEEDING  Date:  4/3/20154/10/2013 11:28 PM  Radiologist:  Jerilynn Mages. Daryll Brod, MD  Guidance:  Ultrasound fluoroscopic  FLUOROSCOPY TIME:  9 min 6 seconds  MEDICATIONS AND MEDICAL HISTORY: 1 mg Versed, 100 mcg fentanyl  ANESTHESIA/SEDATION: 55 min  CONTRAST:  171mL OMNIPAQUE IOHEXOL 300 MG/ML  SOLN  COMPLICATIONS: No immediate  PROCEDURE: Informed consent was  obtained from the patient following explanation of the procedure, risks, benefits and alternatives. The patient understands, agrees and consents for the procedure. All questions were addressed. Allen time out was performed.  Maximal barrier sterile technique utilized including caps, mask, sterile gowns, sterile gloves, large sterile drape, hand hygiene, and ChloraPrep.  Under sterile conditions and local anesthesia, ultrasound micropuncture access was performed of the left common femoral artery. Guidewire inserted followed by Allen 5 fr sheath. Pigtail catheter advanced to the aortic bifurcation. Flush pelvic angiogram performed.  Pelvic angiogram: Diffuse iliac calcific atherosclerosis. The distal aortic bifurcation and pelvic iliac vessels are widely patent. The common, internal, and external iliac arteries are patent. Visualized femoral vessels are patent in the inguinal regions. In the right hemipelvis, there is contrast extravasation compatible with active arterial bleeding from Allen small branch originating peripherally off of the internal iliac vasculature.  For further investigation, catheter was exchanged for Allen C2 catheter. This catheter was advanced over the bifurcation into the right internal iliac artery. Selective right internal iliac angiogram performed.  Right Internal iliac artery: Right internal iliac artery is patent. Anterior and posterior divisions are patent. Active arterial bleeding demonstrated in the inferior right hemipelvis from Allen small peripheral branch either from the obturator or pudendal arteries.  From this location, Allen Renegade micro catheter was advanced through the C2 catheter over Allen micro glidewire. Initially the catheter was advanced into the right inferior gluteal artery. Selective angiogram of the inferior gluteal artery did not  demonstrate any active bleeding.  Micro catheter was retracted and advanced into the internal pudendal artery. Selective angiogram demonstrates patency of the  internal pudendal artery without active bleeding.  Catheter was retracted and advanced into the right obturator artery. Selective right obturator angiogram demonstrates active bleeding from Allen small branch off of the right obturator artery along the right superior ramus. This correlates with the active bleeding site by CT.  Embolization: From this peripheral location within the right obturator artery, initially Gel-Foam embolization was performed along the right superior ramus fracture. Additionally, 4 3 mm x 6cm Interlock micro coils were deployed peripherally in the right obturator artery across the small bleeding obturator branch.  Final post embolization angiogram demonstrates occlusion of the peripheral obturarot artery without any additional active arterial bleeding.  Micro catheter was retracted into the internal iliac artery. Additional internal iliac angiogram demonstrates no additional active bleeding site. Micro catheter was removed. Additional right internal iliac angiogram performed through the 5 French catheter. Again, no additional right hemipelvis bleeding site.  Access was removed. Hemostasis obtained with Exoseal device at the left common femoral artery access site. Patient tolerated the procedure well. No immediate complication.  IMPRESSION: Right hemipelvis active arterial bleeding from Allen small peripheral branch off of the right obturator artery at the level of the right superior ramus fracture. This was successfully embolized with Gelfoam and four 3 mm micro coils peripherally within the right obturator artery.  No additional right hemipelvis active bleeding site demonstrated post embolization.  Findings discussed with Dr. Grandville Silos following the procedure.   Electronically Signed   By: Daryll Brod M.D.   On: 11/16/2013 09:30   Ir Angiogram Selective Each Additional Vessel  11/16/2013   CLINICAL DATA:  Traumatic right pelvic iliac and rami fractures with Allen large pelvic retroperitoneal hematoma  and active bleeding by CT.  EXAM: ULTRASOUND GUIDANCE FOR VASCULAR ACCESS  PELVIC FLUSH AORTOGRAM  SELECTIVE RIGHT INTERNAL ILIAC, INFERIOR GLUTEAL, PUDENDAL, AND OBTURATOR ARTERIAL CATHETERIZATIONS AND ANGIOGRAMS  PERIPHERAL RIGHT OBTURATOR ARTERY PARTICLE AND COIL EMBOLIZATION FOR ACTIVE BLEEDING  Date:  4/3/20154/10/2013 11:28 PM  Radiologist:  Jerilynn Mages. Daryll Brod, MD  Guidance:  Ultrasound fluoroscopic  FLUOROSCOPY TIME:  9 min 6 seconds  MEDICATIONS AND MEDICAL HISTORY: 1 mg Versed, 100 mcg fentanyl  ANESTHESIA/SEDATION: 55 min  CONTRAST:  168mL OMNIPAQUE IOHEXOL 300 MG/ML  SOLN  COMPLICATIONS: No immediate  PROCEDURE: Informed consent was obtained from the patient following explanation of the procedure, risks, benefits and alternatives. The patient understands, agrees and consents for the procedure. All questions were addressed. Allen time out was performed.  Maximal barrier sterile technique utilized including caps, mask, sterile gowns, sterile gloves, large sterile drape, hand hygiene, and ChloraPrep.  Under sterile conditions and local anesthesia, ultrasound micropuncture access was performed of the left common femoral artery. Guidewire inserted followed by Allen 5 fr sheath. Pigtail catheter advanced to the aortic bifurcation. Flush pelvic angiogram performed.  Pelvic angiogram: Diffuse iliac calcific atherosclerosis. The distal aortic bifurcation and pelvic iliac vessels are widely patent. The common, internal, and external iliac arteries are patent. Visualized femoral vessels are patent in the inguinal regions. In the right hemipelvis, there is contrast extravasation compatible with active arterial bleeding from Allen small branch originating peripherally off of the internal iliac vasculature.  For further investigation, catheter was exchanged for Allen C2 catheter. This catheter was advanced over the bifurcation into the right internal iliac artery. Selective right internal iliac angiogram performed.  Right Internal iliac  artery: Right internal  iliac artery is patent. Anterior and posterior divisions are patent. Active arterial bleeding demonstrated in the inferior right hemipelvis from Allen small peripheral branch either from the obturator or pudendal arteries.  From this location, Allen Renegade micro catheter was advanced through the C2 catheter over Allen micro glidewire. Initially the catheter was advanced into the right inferior gluteal artery. Selective angiogram of the inferior gluteal artery did not demonstrate any active bleeding.  Micro catheter was retracted and advanced into the internal pudendal artery. Selective angiogram demonstrates patency of the internal pudendal artery without active bleeding.  Catheter was retracted and advanced into the right obturator artery. Selective right obturator angiogram demonstrates active bleeding from Allen small branch off of the right obturator artery along the right superior ramus. This correlates with the active bleeding site by CT.  Embolization: From this peripheral location within the right obturator artery, initially Gel-Foam embolization was performed along the right superior ramus fracture. Additionally, 4 3 mm x 6cm Interlock micro coils were deployed peripherally in the right obturator artery across the small bleeding obturator branch.  Final post embolization angiogram demonstrates occlusion of the peripheral obturarot artery without any additional active arterial bleeding.  Micro catheter was retracted into the internal iliac artery. Additional internal iliac angiogram demonstrates no additional active bleeding site. Micro catheter was removed. Additional right internal iliac angiogram performed through the 5 French catheter. Again, no additional right hemipelvis bleeding site.  Access was removed. Hemostasis obtained with Exoseal device at the left common femoral artery access site. Patient tolerated the procedure well. No immediate complication.  IMPRESSION: Right hemipelvis active  arterial bleeding from Allen small peripheral branch off of the right obturator artery at the level of the right superior ramus fracture. This was successfully embolized with Gelfoam and four 3 mm micro coils peripherally within the right obturator artery.  No additional right hemipelvis active bleeding site demonstrated post embolization.  Findings discussed with Dr. Grandville Silos following the procedure.   Electronically Signed   By: Daryll Brod M.D.   On: 11/16/2013 09:30   Ir Angiogram Selective Each Additional Vessel  11/16/2013   CLINICAL DATA:  Traumatic right pelvic iliac and rami fractures with Allen large pelvic retroperitoneal hematoma and active bleeding by CT.  EXAM: ULTRASOUND GUIDANCE FOR VASCULAR ACCESS  PELVIC FLUSH AORTOGRAM  SELECTIVE RIGHT INTERNAL ILIAC, INFERIOR GLUTEAL, PUDENDAL, AND OBTURATOR ARTERIAL CATHETERIZATIONS AND ANGIOGRAMS  PERIPHERAL RIGHT OBTURATOR ARTERY PARTICLE AND COIL EMBOLIZATION FOR ACTIVE BLEEDING  Date:  4/3/20154/10/2013 11:28 PM  Radiologist:  Jerilynn Mages. Daryll Brod, MD  Guidance:  Ultrasound fluoroscopic  FLUOROSCOPY TIME:  9 min 6 seconds  MEDICATIONS AND MEDICAL HISTORY: 1 mg Versed, 100 mcg fentanyl  ANESTHESIA/SEDATION: 55 min  CONTRAST:  194mL OMNIPAQUE IOHEXOL 300 MG/ML  SOLN  COMPLICATIONS: No immediate  PROCEDURE: Informed consent was obtained from the patient following explanation of the procedure, risks, benefits and alternatives. The patient understands, agrees and consents for the procedure. All questions were addressed. Allen time out was performed.  Maximal barrier sterile technique utilized including caps, mask, sterile gowns, sterile gloves, large sterile drape, hand hygiene, and ChloraPrep.  Under sterile conditions and local anesthesia, ultrasound micropuncture access was performed of the left common femoral artery. Guidewire inserted followed by Allen 5 fr sheath. Pigtail catheter advanced to the aortic bifurcation. Flush pelvic angiogram performed.  Pelvic angiogram:  Diffuse iliac calcific atherosclerosis. The distal aortic bifurcation and pelvic iliac vessels are widely patent. The common, internal, and external iliac arteries are patent. Visualized femoral vessels  are patent in the inguinal regions. In the right hemipelvis, there is contrast extravasation compatible with active arterial bleeding from Allen small branch originating peripherally off of the internal iliac vasculature.  For further investigation, catheter was exchanged for Allen C2 catheter. This catheter was advanced over the bifurcation into the right internal iliac artery. Selective right internal iliac angiogram performed.  Right Internal iliac artery: Right internal iliac artery is patent. Anterior and posterior divisions are patent. Active arterial bleeding demonstrated in the inferior right hemipelvis from Allen small peripheral branch either from the obturator or pudendal arteries.  From this location, Allen Renegade micro catheter was advanced through the C2 catheter over Allen micro glidewire. Initially the catheter was advanced into the right inferior gluteal artery. Selective angiogram of the inferior gluteal artery did not demonstrate any active bleeding.  Micro catheter was retracted and advanced into the internal pudendal artery. Selective angiogram demonstrates patency of the internal pudendal artery without active bleeding.  Catheter was retracted and advanced into the right obturator artery. Selective right obturator angiogram demonstrates active bleeding from Allen small branch off of the right obturator artery along the right superior ramus. This correlates with the active bleeding site by CT.  Embolization: From this peripheral location within the right obturator artery, initially Gel-Foam embolization was performed along the right superior ramus fracture. Additionally, 4 3 mm x 6cm Interlock micro coils were deployed peripherally in the right obturator artery across the small bleeding obturator branch.  Final post  embolization angiogram demonstrates occlusion of the peripheral obturarot artery without any additional active arterial bleeding.  Micro catheter was retracted into the internal iliac artery. Additional internal iliac angiogram demonstrates no additional active bleeding site. Micro catheter was removed. Additional right internal iliac angiogram performed through the 5 French catheter. Again, no additional right hemipelvis bleeding site.  Access was removed. Hemostasis obtained with Exoseal device at the left common femoral artery access site. Patient tolerated the procedure well. No immediate complication.  IMPRESSION: Right hemipelvis active arterial bleeding from Allen small peripheral branch off of the right obturator artery at the level of the right superior ramus fracture. This was successfully embolized with Gelfoam and four 3 mm micro coils peripherally within the right obturator artery.  No additional right hemipelvis active bleeding site demonstrated post embolization.  Findings discussed with Dr. Grandville Silos following the procedure.   Electronically Signed   By: Daryll Brod M.D.   On: 11/16/2013 09:30   Ir Angiogram Follow Up Study  11/16/2013   CLINICAL DATA:  Traumatic right pelvic iliac and rami fractures with Allen large pelvic retroperitoneal hematoma and active bleeding by CT.  EXAM: ULTRASOUND GUIDANCE FOR VASCULAR ACCESS  PELVIC FLUSH AORTOGRAM  SELECTIVE RIGHT INTERNAL ILIAC, INFERIOR GLUTEAL, PUDENDAL, AND OBTURATOR ARTERIAL CATHETERIZATIONS AND ANGIOGRAMS  PERIPHERAL RIGHT OBTURATOR ARTERY PARTICLE AND COIL EMBOLIZATION FOR ACTIVE BLEEDING  Date:  4/3/20154/10/2013 11:28 PM  Radiologist:  Jerilynn Mages. Daryll Brod, MD  Guidance:  Ultrasound fluoroscopic  FLUOROSCOPY TIME:  9 min 6 seconds  MEDICATIONS AND MEDICAL HISTORY: 1 mg Versed, 100 mcg fentanyl  ANESTHESIA/SEDATION: 55 min  CONTRAST:  169mL OMNIPAQUE IOHEXOL 300 MG/ML  SOLN  COMPLICATIONS: No immediate  PROCEDURE: Informed consent was obtained from the  patient following explanation of the procedure, risks, benefits and alternatives. The patient understands, agrees and consents for the procedure. All questions were addressed. Allen time out was performed.  Maximal barrier sterile technique utilized including caps, mask, sterile gowns, sterile gloves, large sterile drape, hand hygiene, and ChloraPrep.  Under sterile conditions and local anesthesia, ultrasound micropuncture access was performed of the left common femoral artery. Guidewire inserted followed by Allen 5 fr sheath. Pigtail catheter advanced to the aortic bifurcation. Flush pelvic angiogram performed.  Pelvic angiogram: Diffuse iliac calcific atherosclerosis. The distal aortic bifurcation and pelvic iliac vessels are widely patent. The common, internal, and external iliac arteries are patent. Visualized femoral vessels are patent in the inguinal regions. In the right hemipelvis, there is contrast extravasation compatible with active arterial bleeding from Allen small branch originating peripherally off of the internal iliac vasculature.  For further investigation, catheter was exchanged for Allen C2 catheter. This catheter was advanced over the bifurcation into the right internal iliac artery. Selective right internal iliac angiogram performed.  Right Internal iliac artery: Right internal iliac artery is patent. Anterior and posterior divisions are patent. Active arterial bleeding demonstrated in the inferior right hemipelvis from Allen small peripheral branch either from the obturator or pudendal arteries.  From this location, Allen Renegade micro catheter was advanced through the C2 catheter over Allen micro glidewire. Initially the catheter was advanced into the right inferior gluteal artery. Selective angiogram of the inferior gluteal artery did not demonstrate any active bleeding.  Micro catheter was retracted and advanced into the internal pudendal artery. Selective angiogram demonstrates patency of the internal pudendal  artery without active bleeding.  Catheter was retracted and advanced into the right obturator artery. Selective right obturator angiogram demonstrates active bleeding from Allen small branch off of the right obturator artery along the right superior ramus. This correlates with the active bleeding site by CT.  Embolization: From this peripheral location within the right obturator artery, initially Gel-Foam embolization was performed along the right superior ramus fracture. Additionally, 4 3 mm x 6cm Interlock micro coils were deployed peripherally in the right obturator artery across the small bleeding obturator branch.  Final post embolization angiogram demonstrates occlusion of the peripheral obturarot artery without any additional active arterial bleeding.  Micro catheter was retracted into the internal iliac artery. Additional internal iliac angiogram demonstrates no additional active bleeding site. Micro catheter was removed. Additional right internal iliac angiogram performed through the 5 French catheter. Again, no additional right hemipelvis bleeding site.  Access was removed. Hemostasis obtained with Exoseal device at the left common femoral artery access site. Patient tolerated the procedure well. No immediate complication.  IMPRESSION: Right hemipelvis active arterial bleeding from Allen small peripheral branch off of the right obturator artery at the level of the right superior ramus fracture. This was successfully embolized with Gelfoam and four 3 mm micro coils peripherally within the right obturator artery.  No additional right hemipelvis active bleeding site demonstrated post embolization.  Findings discussed with Dr. Grandville Silos following the procedure.   Electronically Signed   By: Daryll Brod M.D.   On: 11/16/2013 09:30   Dg Pelvis Portable  11/15/2013   CLINICAL DATA:  Pt trampled by cow, pain mostly in right hip area, low bp  EXAM: PORTABLE PELVIS 1-2 VIEWS  COMPARISON:  None.  FINDINGS: An area of  cortical irregularity, with Allen possible avulsion component, is appreciated along the lateral aspect of the right iliac wing. There is overlying soft tissue swelling, concerning for hematoma.  Allen nondisplaced fracture with impaction is appreciated along the distal aspect of the superior pubic ramus on the right.  An oblique lucency projects along the body of the superior pubic ramus on the left differential considerations Mach line versus fracture.  Degenerative changes appreciated within the  lower lumbar spine and right and left hips. Dystrophic calcifications overlying the pubis region on the left.  Bones osteopenic.  IMPRESSION: 1. Superior pubic ramus fracture on the right 2. Findings concerning for avulsion fracture lateral aspect of the iliac wing on the right. With findings also concerning for an overlying hematoma. 3. These results were called by telephone at the time of interpretation on 11/15/2013 at 4:33 PM to Dr. Leonard Schwartz , who verbally acknowledged these results. 4. Lucency of the body of the superior pubic ramus on the left fracture versus Mach line   Electronically Signed   By: Margaree Mackintosh M.D.   On: 11/15/2013 16:35   Ct Femur Right W Contrast  11/15/2013   CLINICAL DATA:  Recent traumatic injury. Patient's stepped on by Allen cow  EXAM: CT OF THE RIGHT LOWER EXTREMITY WITH CONTRAST  TECHNIQUE: Contiguous axial images were obtained to the right lower extremity from the pelvis to the level of the knee joint following contrast administration. Sagittal and coronal reconstructions were obtained.  CONTRAST:  145mL OMNIPAQUE IOHEXOL 300 MG/ML  SOLN  COMPARISON:  None.  FINDINGS: There again noted changes consistent with superior pubic ramus fracture on the right. The femur is well visualized and shows no evidence of acute fracture. The soft tissue structures shown the musculature of the thigh to be within normal limits. No definitive muscular hematomas are seen. Soft tissue fluid attenuation is identified  consistent with localized subcutaneous hematoma. No definitive active extravasation is noted however. The large pelvic hematoma is again identified and stable.  IMPRESSION: No evidence of femoral fracture.  Right superior pubic ramus fracture.  Soft tissue changes are noted in the medial thigh consistent with localized hemorrhage although no active extravasation is noted. No hematoma is noted within the musculature.   Electronically Signed   By: Inez Catalina M.D.   On: 11/15/2013 18:12   Ir US Guide Vasc Access Left  11/16/2013   CLINICAL DATA:  Traumatic right pelvic iliac and rami fractures with Allen large pelvic retroperitoneal hematoma and active bleeding by CT.  EXAM: ULTRASOUND GUIDANCE FOR VASCULAR ACCESS  PELVIC FLUSH AORTOGRAM  SELECTIVE RIGHT INTERNAL ILIAC, INFERIOR GLUTEAL, PUDENDAL, AND OBTURATOR ARTERIAL CATHETERIZATIONS AND ANGIOGRAMS  PERIPHERAL RIGHT OBTURATOR ARTERY PARTICLE AND COIL EMBOLIZATION FOR ACTIVE BLEEDING  Date:  4/3/20154/10/2013 11:28 PM  Radiologist:  Jerilynn Mages. Daryll Brod, MD  Guidance:  Ultrasound fluoroscopic  FLUOROSCOPY TIME:  9 min 6 seconds  MEDICATIONS AND MEDICAL HISTORY: 1 mg Versed, 100 mcg fentanyl  ANESTHESIA/SEDATION: 55 min  CONTRAST:  110mL OMNIPAQUE IOHEXOL 300 MG/ML  SOLN  COMPLICATIONS: No immediate  PROCEDURE: Informed consent was obtained from the patient following explanation of the procedure, risks, benefits and alternatives. The patient understands, agrees and consents for the procedure. All questions were addressed. Allen time out was performed.  Maximal barrier sterile technique utilized including caps, mask, sterile gowns, sterile gloves, large sterile drape, hand hygiene, and ChloraPrep.  Under sterile conditions and local anesthesia, ultrasound micropuncture access was performed of the left common femoral artery. Guidewire inserted followed by Allen 5 fr sheath. Pigtail catheter advanced to the aortic bifurcation. Flush pelvic angiogram performed.  Pelvic angiogram:  Diffuse iliac calcific atherosclerosis. The distal aortic bifurcation and pelvic iliac vessels are widely patent. The common, internal, and external iliac arteries are patent. Visualized femoral vessels are patent in the inguinal regions. In the right hemipelvis, there is contrast extravasation compatible with active arterial bleeding from Allen small branch originating peripherally off of the  internal iliac vasculature.  For further investigation, catheter was exchanged for Allen C2 catheter. This catheter was advanced over the bifurcation into the right internal iliac artery. Selective right internal iliac angiogram performed.  Right Internal iliac artery: Right internal iliac artery is patent. Anterior and posterior divisions are patent. Active arterial bleeding demonstrated in the inferior right hemipelvis from Allen small peripheral branch either from the obturator or pudendal arteries.  From this location, Allen Renegade micro catheter was advanced through the C2 catheter over Allen micro glidewire. Initially the catheter was advanced into the right inferior gluteal artery. Selective angiogram of the inferior gluteal artery did not demonstrate any active bleeding.  Micro catheter was retracted and advanced into the internal pudendal artery. Selective angiogram demonstrates patency of the internal pudendal artery without active bleeding.  Catheter was retracted and advanced into the right obturator artery. Selective right obturator angiogram demonstrates active bleeding from Allen small branch off of the right obturator artery along the right superior ramus. This correlates with the active bleeding site by CT.  Embolization: From this peripheral location within the right obturator artery, initially Gel-Foam embolization was performed along the right superior ramus fracture. Additionally, 4 3 mm x 6cm Interlock micro coils were deployed peripherally in the right obturator artery across the small bleeding obturator branch.  Final post  embolization angiogram demonstrates occlusion of the peripheral obturarot artery without any additional active arterial bleeding.  Micro catheter was retracted into the internal iliac artery. Additional internal iliac angiogram demonstrates no additional active bleeding site. Micro catheter was removed. Additional right internal iliac angiogram performed through the 5 French catheter. Again, no additional right hemipelvis bleeding site.  Access was removed. Hemostasis obtained with Exoseal device at the left common femoral artery access site. Patient tolerated the procedure well. No immediate complication.  IMPRESSION: Right hemipelvis active arterial bleeding from Allen small peripheral branch off of the right obturator artery at the level of the right superior ramus fracture. This was successfully embolized with Gelfoam and four 3 mm micro coils peripherally within the right obturator artery.  No additional right hemipelvis active bleeding site demonstrated post embolization.  Findings discussed with Dr. Grandville Silos following the procedure.   Electronically Signed   By: Daryll Brod M.D.   On: 11/16/2013 09:30   Dg Chest Port 1 View  11/15/2013   CLINICAL DATA:  Pt trampled by cow, pain mostly in right hip area, low bp  EXAM: PORTABLE CHEST - 1 VIEW  COMPARISON:  CT ANGIO CHEST W/CM &/OR WO/CM dated 10/03/2012; DG CHEST 2 VIEW dated 07/07/2007  FINDINGS: Low lung volumes. Cardiac silhouette enlarged. Aorta tortuous and ectatic. Lungs are clear. Degenerative changes right and left shoulders osseous structures otherwise unremarkable. There is no evidence of Allen pneumothorax.  IMPRESSION: No acute cardiopulmonary disease.   Electronically Signed   By: Margaree Mackintosh M.D.   On: 11/15/2013 16:25   [place] Guide Roadmapping  11/16/2013   CLINICAL DATA:  Traumatic right pelvic iliac and rami fractures with Allen large pelvic retroperitoneal hematoma and active bleeding by CT.  EXAM: ULTRASOUND  GUIDANCE FOR VASCULAR ACCESS  PELVIC FLUSH AORTOGRAM  SELECTIVE RIGHT INTERNAL ILIAC, INFERIOR GLUTEAL, PUDENDAL, AND OBTURATOR ARTERIAL CATHETERIZATIONS AND ANGIOGRAMS  PERIPHERAL RIGHT OBTURATOR ARTERY PARTICLE AND COIL EMBOLIZATION FOR ACTIVE BLEEDING  Date:  4/3/20154/10/2013 11:28 PM  Radiologist:  Jerilynn Mages. Daryll Brod, MD  Guidance:  Ultrasound fluoroscopic  FLUOROSCOPY TIME:  9 min 6 seconds  MEDICATIONS  AND MEDICAL HISTORY: 1 mg Versed, 100 mcg fentanyl  ANESTHESIA/SEDATION: 55 min  CONTRAST:  123mL OMNIPAQUE IOHEXOL 300 MG/ML  SOLN  COMPLICATIONS: No immediate  PROCEDURE: Informed consent was obtained from the patient following explanation of the procedure, risks, benefits and alternatives. The patient understands, agrees and consents for the procedure. All questions were addressed. Allen time out was performed.  Maximal barrier sterile technique utilized including caps, mask, sterile gowns, sterile gloves, large sterile drape, hand hygiene, and ChloraPrep.  Under sterile conditions and local anesthesia, ultrasound micropuncture access was performed of the left common femoral artery. Guidewire inserted followed by Allen 5 fr sheath. Pigtail catheter advanced to the aortic bifurcation. Flush pelvic angiogram performed.  Pelvic angiogram: Diffuse iliac calcific atherosclerosis. The distal aortic bifurcation and pelvic iliac vessels are widely patent. The common, internal, and external iliac arteries are patent. Visualized femoral vessels are patent in the inguinal regions. In the right hemipelvis, there is contrast extravasation compatible with active arterial bleeding from Allen small branch originating peripherally off of the internal iliac vasculature.  For further investigation, catheter was exchanged for Allen C2 catheter. This catheter was advanced over the bifurcation into the right internal iliac artery. Selective right internal iliac angiogram performed.  Right Internal iliac artery: Right internal iliac artery is patent.  Anterior and posterior divisions are patent. Active arterial bleeding demonstrated in the inferior right hemipelvis from Allen small peripheral branch either from the obturator or pudendal arteries.  From this location, Allen Renegade micro catheter was advanced through the C2 catheter over Allen micro glidewire. Initially the catheter was advanced into the right inferior gluteal artery. Selective angiogram of the inferior gluteal artery did not demonstrate any active bleeding.  Micro catheter was retracted and advanced into the internal pudendal artery. Selective angiogram demonstrates patency of the internal pudendal artery without active bleeding.  Catheter was retracted and advanced into the right obturator artery. Selective right obturator angiogram demonstrates active bleeding from Allen small branch off of the right obturator artery along the right superior ramus. This correlates with the active bleeding site by CT.  Embolization: From this peripheral location within the right obturator artery, initially Gel-Foam embolization was performed along the right superior ramus fracture. Additionally, 4 3 mm x 6cm Interlock micro coils were deployed peripherally in the right obturator artery across the small bleeding obturator branch.  Final post embolization angiogram demonstrates occlusion of the peripheral obturarot artery without any additional active arterial bleeding.  Micro catheter was retracted into the internal iliac artery. Additional internal iliac angiogram demonstrates no additional active bleeding site. Micro catheter was removed. Additional right internal iliac angiogram performed through the 5 French catheter. Again, no additional right hemipelvis bleeding site.  Access was removed. Hemostasis obtained with Exoseal device at the left common femoral artery access site. Patient tolerated the procedure well. No immediate complication.  IMPRESSION: Right hemipelvis active arterial bleeding from Allen small peripheral branch  off of the right obturator artery at the level of the right superior ramus fracture. This was successfully embolized with Gelfoam and four 3 mm micro coils peripherally within the right obturator artery.  No additional right hemipelvis active bleeding site demonstrated post embolization.  Findings discussed with Dr. Grandville Silos following the procedure.   Electronically Signed   By: Daryll Brod M.D.   On: 11/16/2013 09:30    Anti-infectives: Anti-infectives   None      Assessment/Plan: s/p * No surgery found *  Pelvic injury Rt obturator artery embolization 4/3 pm Doing  well; stable h/h Will monitor   LOS: 1 day    Lee Allen 11/16/2013

## 2013-11-16 NOTE — Procedures (Signed)
Arterial Catheter Insertion Procedure Note Lee Allen [id number] [birth year]  Procedure: Insertion of Arterial Catheter  Indications: Blood pressure monitoring  Procedure Details Consent: Risks of procedure as well as the alternatives and risks of each were explained to the (patient/caregiver).  Consent for procedure obtained. Time Out: Verified patient identification, verified procedure, site/side was marked, verified correct patient position, special equipment/implants available, medications/allergies/relevent history reviewed, required imaging and test results available.  Performed  Maximum sterile technique was used including antiseptics, cap, gloves, gown, hand hygiene and sheet. Skin prep: Chlorhexidine; local anesthetic administered 20 gauge catheter was inserted into left radial artery using the Seldinger technique.  Evaluation Blood flow good; BP tracing good. Complications: No apparent complications.   Lee Allen 11/16/2013

## 2013-11-17 DIAGNOSIS — S329XXA Fracture of unspecified parts of lumbosacral spine and pelvis, initial encounter for closed fracture: Secondary | ICD-10-CM | POA: Diagnosis not present

## 2013-11-17 DIAGNOSIS — D62 Acute posthemorrhagic anemia: Secondary | ICD-10-CM | POA: Diagnosis not present

## 2013-11-17 DIAGNOSIS — T794XXA Traumatic shock, initial encounter: Secondary | ICD-10-CM | POA: Diagnosis not present

## 2013-11-17 DIAGNOSIS — D696 Thrombocytopenia, unspecified: Secondary | ICD-10-CM | POA: Diagnosis not present

## 2013-11-17 LAB — CBC
HCT: 19.7 % — ABNORMAL LOW (ref 39.0–52.0)
Hemoglobin: 6.5 g/dL — CL (ref 13.0–17.0)
MCH: 27.4 pg (ref 26.0–34.0)
MCHC: 33 g/dL (ref 30.0–36.0)
MCV: 83.1 fL (ref 78.0–100.0)
PLATELETS: 72 10*3/uL — AB (ref 150–400)
RBC: 2.37 MIL/uL — ABNORMAL LOW (ref 4.22–5.81)
RDW: 17.3 % — AB (ref 11.5–15.5)
WBC: 5.1 10*3/uL (ref 4.0–10.5)

## 2013-11-17 LAB — HEMOGLOBIN AND HEMATOCRIT, BLOOD
HCT: 22 % — ABNORMAL LOW (ref 39.0–52.0)
HEMOGLOBIN: 7.3 g/dL — AB (ref 13.0–17.0)

## 2013-11-17 LAB — PROTIME-INR
INR: 1.16 (ref 0.00–1.49)
Prothrombin Time: 14.6 seconds (ref 11.6–15.2)

## 2013-11-17 MED ORDER — FUROSEMIDE 10 MG/ML IJ SOLN
20.0000 mg | Freq: Once | INTRAMUSCULAR | Status: AC
  Administered 2013-11-17: 20 mg via INTRAVENOUS
  Filled 2013-11-17 (×2): qty 2

## 2013-11-17 MED ORDER — OXYCODONE HCL 5 MG PO TABS
5.0000 mg | ORAL_TABLET | ORAL | Status: DC | PRN
  Administered 2013-11-17: 10 mg via ORAL
  Filled 2013-11-17: qty 2

## 2013-11-17 NOTE — Progress Notes (Signed)
Subjective: Pelvic traumatic injury Rt obturator artery embo 4/3 Doing well Up in chair Eating well UOP good; passing gas  Objective: Vital signs in last 24 hours: Temp:  [97.5 F (36.4 C)-99.8 F (37.7 C)] 98.6 F (37 C) (04/05 0704) Pulse Rate:  [75-100] 86 (04/05 0900) Resp:  [18-26] 19 (04/05 0900) BP: (97-154)/(52-82) 131/66 mmHg (04/05 0900) SpO2:  [87 %-100 %] 95 % (04/05 0900) Arterial Line BP: (139-149)/(66-71) 144/68 mmHg (04/04 1400)    Intake/Output from previous day: 04/04 0701 - 04/05 0700 In: 2531.3 [P.O.:820; I.V.:1378.8; Blood:332.5] Out: 1047 [VHQIO:9629] Intake/Output this shift: Total I/O In: 100 [I.V.:100] Out: -   PE:  Afeb; vss Up in chair Pleasant Lt groin NT; no bleeding; no hematoma Lt foot sl swollen; ecchymotic posteriorly 2+ pulses H/H: 6.5/19.7 (7.3/21.6)- transfused 1 u this am   Lab Results:   Recent Labs  11/16/13 1945 11/17/13 0235  WBC 7.8 5.1  HGB 7.3* 6.5*  HCT 21.6* 19.7*  PLT 96* 72*   BMET  Recent Labs  11/15/13 1455 11/15/13 1608 11/16/13 0620  NA 143 147 142  K 3.9 3.8 4.2  CL 107 111 110  CO2 21  --  19  GLUCOSE 200* 143* 165*  BUN 27* 23 25*  CREATININE 1.20 1.20 1.06  CALCIUM 9.3  --  7.8*   PT/INR  Recent Labs  11/16/13 1030 11/17/13 0235  LABPROT 14.5 14.6  INR 1.15 1.16   ABG No results found for this basename: PHART, PCO2, PO2, HCO3,  in the last 72 hours  Studies/Results: Ct Abdomen Pelvis W Contrast  11/15/2013   CLINICAL DATA:  Recent traumatic injury, stepped on by a cow with pelvic pain  EXAM: CT ABDOMEN AND PELVIS WITH CONTRAST  TECHNIQUE: Multidetector CT imaging of the abdomen and pelvis was performed using the standard protocol following bolus administration of intravenous contrast.  CONTRAST:  15mL OMNIPAQUE IOHEXOL 300 MG/ML  SOLN  COMPARISON:  10/03/2012, 06/26/2007  FINDINGS: Lung bases are free of acute infiltrate or sizable effusion.  The gallbladder is well distended  with multiple small gallstones within. The liver demonstrates a rounded area of decreased attenuation along the lateral aspect of the right lobe measuring 2.2 cm. This may represent a cyst but was not present on a recent exam dated 10/03/2012. Given the recent injury to an adjacent perihepatic fluid would be difficult to exclude a small hepatic laceration in this region. No definitive rib fractures noted in this region.  The spleen, adrenal glands and pancreas are within normal limits with the exception of a small cyst within the body of the pancreas. The kidneys demonstrate multiple cysts bilaterally. No findings to suggest complicated cysts are identified. Delayed images through the kidneys demonstrate normal excretion of contrast material.  In the pelvis there is a large hematoma identified along the lateral pelvic wall on the right. It measures approximately 15 x 5.5 cm in greatest AP and transverse dimensions and extends for approximately be 16 cm 10 cm in the craniocaudad plane. There are areas of the active extravasation within this hematoma in the midportion at the level of the acetabulum. This displaces the urinary bladder which is decompressed by Foley catheter. Multiple prostate calcifications are identified. Comminuted fracture of the right iliac wing is noted with enlargement of the iliacus muscle on the right consistent with intramuscular hematoma. No active extravasation is noted in this region. Diffuse prostatic calcifications are noted. There is a superior pubic ramus fracture identified on the right as well.  No other definitive fractures are seen.  IMPRESSION: Changes consistent with a recent injury with fractures of the right iliac wing and right superior pubic ramus. There associated muscular hematoma within the right iliacus muscle as well as a large pelvic hematoma with active extravasation as described.  Multiple cystic lesions within the kidneys and pancreas.  Cholelithiasis.  Hypodensity  within the liver with adjacent perihepatic fluid. Given the history a would be difficult to exclude a small laceration in this region although no active extravasation is seen.  Critical Value/emergent results were discussed in person at the time of exam performance on 11/15/2013 at 530 PM to Dr. Georganna Skeans, who verbally acknowledged these results.   Electronically Signed   By: Inez Catalina M.D.   On: 11/15/2013 18:10   Ir Angiogram Pelvis Selective Or Supraselective  11/16/2013   CLINICAL DATA:  Traumatic right pelvic iliac and rami fractures with a large pelvic retroperitoneal hematoma and active bleeding by CT.  EXAM: ULTRASOUND GUIDANCE FOR VASCULAR ACCESS  PELVIC FLUSH AORTOGRAM  SELECTIVE RIGHT INTERNAL ILIAC, INFERIOR GLUTEAL, PUDENDAL, AND OBTURATOR ARTERIAL CATHETERIZATIONS AND ANGIOGRAMS  PERIPHERAL RIGHT OBTURATOR ARTERY PARTICLE AND COIL EMBOLIZATION FOR ACTIVE BLEEDING  Date:  4/3/20154/10/2013 11:28 PM  Radiologist:  Jerilynn Mages. Daryll Brod, MD  Guidance:  Ultrasound fluoroscopic  FLUOROSCOPY TIME:  9 min 6 seconds  MEDICATIONS AND MEDICAL HISTORY: 1 mg Versed, 100 mcg fentanyl  ANESTHESIA/SEDATION: 55 min  CONTRAST:  166mL OMNIPAQUE IOHEXOL 300 MG/ML  SOLN  COMPLICATIONS: No immediate  PROCEDURE: Informed consent was obtained from the patient following explanation of the procedure, risks, benefits and alternatives. The patient understands, agrees and consents for the procedure. All questions were addressed. A time out was performed.  Maximal barrier sterile technique utilized including caps, mask, sterile gowns, sterile gloves, large sterile drape, hand hygiene, and ChloraPrep.  Under sterile conditions and local anesthesia, ultrasound micropuncture access was performed of the left common femoral artery. Guidewire inserted followed by a 5 fr sheath. Pigtail catheter advanced to the aortic bifurcation. Flush pelvic angiogram performed.  Pelvic angiogram: Diffuse iliac calcific atherosclerosis. The distal  aortic bifurcation and pelvic iliac vessels are widely patent. The common, internal, and external iliac arteries are patent. Visualized femoral vessels are patent in the inguinal regions. In the right hemipelvis, there is contrast extravasation compatible with active arterial bleeding from a small branch originating peripherally off of the internal iliac vasculature.  For further investigation, catheter was exchanged for a C2 catheter. This catheter was advanced over the bifurcation into the right internal iliac artery. Selective right internal iliac angiogram performed.  Right Internal iliac artery: Right internal iliac artery is patent. Anterior and posterior divisions are patent. Active arterial bleeding demonstrated in the inferior right hemipelvis from a small peripheral branch either from the obturator or pudendal arteries.  From this location, a Renegade micro catheter was advanced through the C2 catheter over a micro glidewire. Initially the catheter was advanced into the right inferior gluteal artery. Selective angiogram of the inferior gluteal artery did not demonstrate any active bleeding.  Micro catheter was retracted and advanced into the internal pudendal artery. Selective angiogram demonstrates patency of the internal pudendal artery without active bleeding.  Catheter was retracted and advanced into the right obturator artery. Selective right obturator angiogram demonstrates active bleeding from a small branch off of the right obturator artery along the right superior ramus. This correlates with the active bleeding site by CT.  Embolization: From this peripheral location within the right obturator artery,  initially Gel-Foam embolization was performed along the right superior ramus fracture. Additionally, 4 3 mm x 6cm Interlock micro coils were deployed peripherally in the right obturator artery across the small bleeding obturator branch.  Final post embolization angiogram demonstrates occlusion of the  peripheral obturarot artery without any additional active arterial bleeding.  Micro catheter was retracted into the internal iliac artery. Additional internal iliac angiogram demonstrates no additional active bleeding site. Micro catheter was removed. Additional right internal iliac angiogram performed through the 5 French catheter. Again, no additional right hemipelvis bleeding site.  Access was removed. Hemostasis obtained with Exoseal device at the left common femoral artery access site. Patient tolerated the procedure well. No immediate complication.  IMPRESSION: Right hemipelvis active arterial bleeding from a small peripheral branch off of the right obturator artery at the level of the right superior ramus fracture. This was successfully embolized with Gelfoam and four 3 mm micro coils peripherally within the right obturator artery.  No additional right hemipelvis active bleeding site demonstrated post embolization.  Findings discussed with Dr. Grandville Silos following the procedure.   Electronically Signed   By: Daryll Brod M.D.   On: 11/16/2013 09:30   Ir Angiogram Selective Each Additional Vessel  11/16/2013   CLINICAL DATA:  Traumatic right pelvic iliac and rami fractures with a large pelvic retroperitoneal hematoma and active bleeding by CT.  EXAM: ULTRASOUND GUIDANCE FOR VASCULAR ACCESS  PELVIC FLUSH AORTOGRAM  SELECTIVE RIGHT INTERNAL ILIAC, INFERIOR GLUTEAL, PUDENDAL, AND OBTURATOR ARTERIAL CATHETERIZATIONS AND ANGIOGRAMS  PERIPHERAL RIGHT OBTURATOR ARTERY PARTICLE AND COIL EMBOLIZATION FOR ACTIVE BLEEDING  Date:  4/3/20154/10/2013 11:28 PM  Radiologist:  Jerilynn Mages. Daryll Brod, MD  Guidance:  Ultrasound fluoroscopic  FLUOROSCOPY TIME:  9 min 6 seconds  MEDICATIONS AND MEDICAL HISTORY: 1 mg Versed, 100 mcg fentanyl  ANESTHESIA/SEDATION: 55 min  CONTRAST:  19mL OMNIPAQUE IOHEXOL 300 MG/ML  SOLN  COMPLICATIONS: No immediate  PROCEDURE: Informed consent was obtained from the patient following explanation of the  procedure, risks, benefits and alternatives. The patient understands, agrees and consents for the procedure. All questions were addressed. A time out was performed.  Maximal barrier sterile technique utilized including caps, mask, sterile gowns, sterile gloves, large sterile drape, hand hygiene, and ChloraPrep.  Under sterile conditions and local anesthesia, ultrasound micropuncture access was performed of the left common femoral artery. Guidewire inserted followed by a 5 fr sheath. Pigtail catheter advanced to the aortic bifurcation. Flush pelvic angiogram performed.  Pelvic angiogram: Diffuse iliac calcific atherosclerosis. The distal aortic bifurcation and pelvic iliac vessels are widely patent. The common, internal, and external iliac arteries are patent. Visualized femoral vessels are patent in the inguinal regions. In the right hemipelvis, there is contrast extravasation compatible with active arterial bleeding from a small branch originating peripherally off of the internal iliac vasculature.  For further investigation, catheter was exchanged for a C2 catheter. This catheter was advanced over the bifurcation into the right internal iliac artery. Selective right internal iliac angiogram performed.  Right Internal iliac artery: Right internal iliac artery is patent. Anterior and posterior divisions are patent. Active arterial bleeding demonstrated in the inferior right hemipelvis from a small peripheral branch either from the obturator or pudendal arteries.  From this location, a Renegade micro catheter was advanced through the C2 catheter over a micro glidewire. Initially the catheter was advanced into the right inferior gluteal artery. Selective angiogram of the inferior gluteal artery did not demonstrate any active bleeding.  Micro catheter was retracted and advanced into the  internal pudendal artery. Selective angiogram demonstrates patency of the internal pudendal artery without active bleeding.  Catheter  was retracted and advanced into the right obturator artery. Selective right obturator angiogram demonstrates active bleeding from a small branch off of the right obturator artery along the right superior ramus. This correlates with the active bleeding site by CT.  Embolization: From this peripheral location within the right obturator artery, initially Gel-Foam embolization was performed along the right superior ramus fracture. Additionally, 4 3 mm x 6cm Interlock micro coils were deployed peripherally in the right obturator artery across the small bleeding obturator branch.  Final post embolization angiogram demonstrates occlusion of the peripheral obturarot artery without any additional active arterial bleeding.  Micro catheter was retracted into the internal iliac artery. Additional internal iliac angiogram demonstrates no additional active bleeding site. Micro catheter was removed. Additional right internal iliac angiogram performed through the 5 French catheter. Again, no additional right hemipelvis bleeding site.  Access was removed. Hemostasis obtained with Exoseal device at the left common femoral artery access site. Patient tolerated the procedure well. No immediate complication.  IMPRESSION: Right hemipelvis active arterial bleeding from a small peripheral branch off of the right obturator artery at the level of the right superior ramus fracture. This was successfully embolized with Gelfoam and four 3 mm micro coils peripherally within the right obturator artery.  No additional right hemipelvis active bleeding site demonstrated post embolization.  Findings discussed with Dr. Grandville Silos following the procedure.   Electronically Signed   By: Daryll Brod M.D.   On: 11/16/2013 09:30   Ir Angiogram Selective Each Additional Vessel  11/16/2013   CLINICAL DATA:  Traumatic right pelvic iliac and rami fractures with a large pelvic retroperitoneal hematoma and active bleeding by CT.  EXAM: ULTRASOUND GUIDANCE FOR  VASCULAR ACCESS  PELVIC FLUSH AORTOGRAM  SELECTIVE RIGHT INTERNAL ILIAC, INFERIOR GLUTEAL, PUDENDAL, AND OBTURATOR ARTERIAL CATHETERIZATIONS AND ANGIOGRAMS  PERIPHERAL RIGHT OBTURATOR ARTERY PARTICLE AND COIL EMBOLIZATION FOR ACTIVE BLEEDING  Date:  4/3/20154/10/2013 11:28 PM  Radiologist:  Jerilynn Mages. Daryll Brod, MD  Guidance:  Ultrasound fluoroscopic  FLUOROSCOPY TIME:  9 min 6 seconds  MEDICATIONS AND MEDICAL HISTORY: 1 mg Versed, 100 mcg fentanyl  ANESTHESIA/SEDATION: 55 min  CONTRAST:  145mL OMNIPAQUE IOHEXOL 300 MG/ML  SOLN  COMPLICATIONS: No immediate  PROCEDURE: Informed consent was obtained from the patient following explanation of the procedure, risks, benefits and alternatives. The patient understands, agrees and consents for the procedure. All questions were addressed. A time out was performed.  Maximal barrier sterile technique utilized including caps, mask, sterile gowns, sterile gloves, large sterile drape, hand hygiene, and ChloraPrep.  Under sterile conditions and local anesthesia, ultrasound micropuncture access was performed of the left common femoral artery. Guidewire inserted followed by a 5 fr sheath. Pigtail catheter advanced to the aortic bifurcation. Flush pelvic angiogram performed.  Pelvic angiogram: Diffuse iliac calcific atherosclerosis. The distal aortic bifurcation and pelvic iliac vessels are widely patent. The common, internal, and external iliac arteries are patent. Visualized femoral vessels are patent in the inguinal regions. In the right hemipelvis, there is contrast extravasation compatible with active arterial bleeding from a small branch originating peripherally off of the internal iliac vasculature.  For further investigation, catheter was exchanged for a C2 catheter. This catheter was advanced over the bifurcation into the right internal iliac artery. Selective right internal iliac angiogram performed.  Right Internal iliac artery: Right internal iliac artery is patent. Anterior and  posterior divisions are patent. Active arterial bleeding  demonstrated in the inferior right hemipelvis from a small peripheral branch either from the obturator or pudendal arteries.  From this location, a Renegade micro catheter was advanced through the C2 catheter over a micro glidewire. Initially the catheter was advanced into the right inferior gluteal artery. Selective angiogram of the inferior gluteal artery did not demonstrate any active bleeding.  Micro catheter was retracted and advanced into the internal pudendal artery. Selective angiogram demonstrates patency of the internal pudendal artery without active bleeding.  Catheter was retracted and advanced into the right obturator artery. Selective right obturator angiogram demonstrates active bleeding from a small branch off of the right obturator artery along the right superior ramus. This correlates with the active bleeding site by CT.  Embolization: From this peripheral location within the right obturator artery, initially Gel-Foam embolization was performed along the right superior ramus fracture. Additionally, 4 3 mm x 6cm Interlock micro coils were deployed peripherally in the right obturator artery across the small bleeding obturator branch.  Final post embolization angiogram demonstrates occlusion of the peripheral obturarot artery without any additional active arterial bleeding.  Micro catheter was retracted into the internal iliac artery. Additional internal iliac angiogram demonstrates no additional active bleeding site. Micro catheter was removed. Additional right internal iliac angiogram performed through the 5 French catheter. Again, no additional right hemipelvis bleeding site.  Access was removed. Hemostasis obtained with Exoseal device at the left common femoral artery access site. Patient tolerated the procedure well. No immediate complication.  IMPRESSION: Right hemipelvis active arterial bleeding from a small peripheral branch off of the  right obturator artery at the level of the right superior ramus fracture. This was successfully embolized with Gelfoam and four 3 mm micro coils peripherally within the right obturator artery.  No additional right hemipelvis active bleeding site demonstrated post embolization.  Findings discussed with Dr. Grandville Silos following the procedure.   Electronically Signed   By: Daryll Brod M.D.   On: 11/16/2013 09:30   Ir Angiogram Selective Each Additional Vessel  11/16/2013   CLINICAL DATA:  Traumatic right pelvic iliac and rami fractures with a large pelvic retroperitoneal hematoma and active bleeding by CT.  EXAM: ULTRASOUND GUIDANCE FOR VASCULAR ACCESS  PELVIC FLUSH AORTOGRAM  SELECTIVE RIGHT INTERNAL ILIAC, INFERIOR GLUTEAL, PUDENDAL, AND OBTURATOR ARTERIAL CATHETERIZATIONS AND ANGIOGRAMS  PERIPHERAL RIGHT OBTURATOR ARTERY PARTICLE AND COIL EMBOLIZATION FOR ACTIVE BLEEDING  Date:  4/3/20154/10/2013 11:28 PM  Radiologist:  Jerilynn Mages. Daryll Brod, MD  Guidance:  Ultrasound fluoroscopic  FLUOROSCOPY TIME:  9 min 6 seconds  MEDICATIONS AND MEDICAL HISTORY: 1 mg Versed, 100 mcg fentanyl  ANESTHESIA/SEDATION: 55 min  CONTRAST:  172mL OMNIPAQUE IOHEXOL 300 MG/ML  SOLN  COMPLICATIONS: No immediate  PROCEDURE: Informed consent was obtained from the patient following explanation of the procedure, risks, benefits and alternatives. The patient understands, agrees and consents for the procedure. All questions were addressed. A time out was performed.  Maximal barrier sterile technique utilized including caps, mask, sterile gowns, sterile gloves, large sterile drape, hand hygiene, and ChloraPrep.  Under sterile conditions and local anesthesia, ultrasound micropuncture access was performed of the left common femoral artery. Guidewire inserted followed by a 5 fr sheath. Pigtail catheter advanced to the aortic bifurcation. Flush pelvic angiogram performed.  Pelvic angiogram: Diffuse iliac calcific atherosclerosis. The distal aortic  bifurcation and pelvic iliac vessels are widely patent. The common, internal, and external iliac arteries are patent. Visualized femoral vessels are patent in the inguinal regions. In the right hemipelvis, there is contrast  extravasation compatible with active arterial bleeding from a small branch originating peripherally off of the internal iliac vasculature.  For further investigation, catheter was exchanged for a C2 catheter. This catheter was advanced over the bifurcation into the right internal iliac artery. Selective right internal iliac angiogram performed.  Right Internal iliac artery: Right internal iliac artery is patent. Anterior and posterior divisions are patent. Active arterial bleeding demonstrated in the inferior right hemipelvis from a small peripheral branch either from the obturator or pudendal arteries.  From this location, a Renegade micro catheter was advanced through the C2 catheter over a micro glidewire. Initially the catheter was advanced into the right inferior gluteal artery. Selective angiogram of the inferior gluteal artery did not demonstrate any active bleeding.  Micro catheter was retracted and advanced into the internal pudendal artery. Selective angiogram demonstrates patency of the internal pudendal artery without active bleeding.  Catheter was retracted and advanced into the right obturator artery. Selective right obturator angiogram demonstrates active bleeding from a small branch off of the right obturator artery along the right superior ramus. This correlates with the active bleeding site by CT.  Embolization: From this peripheral location within the right obturator artery, initially Gel-Foam embolization was performed along the right superior ramus fracture. Additionally, 4 3 mm x 6cm Interlock micro coils were deployed peripherally in the right obturator artery across the small bleeding obturator branch.  Final post embolization angiogram demonstrates occlusion of the  peripheral obturarot artery without any additional active arterial bleeding.  Micro catheter was retracted into the internal iliac artery. Additional internal iliac angiogram demonstrates no additional active bleeding site. Micro catheter was removed. Additional right internal iliac angiogram performed through the 5 French catheter. Again, no additional right hemipelvis bleeding site.  Access was removed. Hemostasis obtained with Exoseal device at the left common femoral artery access site. Patient tolerated the procedure well. No immediate complication.  IMPRESSION: Right hemipelvis active arterial bleeding from a small peripheral branch off of the right obturator artery at the level of the right superior ramus fracture. This was successfully embolized with Gelfoam and four 3 mm micro coils peripherally within the right obturator artery.  No additional right hemipelvis active bleeding site demonstrated post embolization.  Findings discussed with Dr. Grandville Silos following the procedure.   Electronically Signed   By: Daryll Brod M.D.   On: 11/16/2013 09:30   Ir Angiogram Follow Up Study  11/16/2013   CLINICAL DATA:  Traumatic right pelvic iliac and rami fractures with a large pelvic retroperitoneal hematoma and active bleeding by CT.  EXAM: ULTRASOUND GUIDANCE FOR VASCULAR ACCESS  PELVIC FLUSH AORTOGRAM  SELECTIVE RIGHT INTERNAL ILIAC, INFERIOR GLUTEAL, PUDENDAL, AND OBTURATOR ARTERIAL CATHETERIZATIONS AND ANGIOGRAMS  PERIPHERAL RIGHT OBTURATOR ARTERY PARTICLE AND COIL EMBOLIZATION FOR ACTIVE BLEEDING  Date:  4/3/20154/10/2013 11:28 PM  Radiologist:  Jerilynn Mages. Daryll Brod, MD  Guidance:  Ultrasound fluoroscopic  FLUOROSCOPY TIME:  9 min 6 seconds  MEDICATIONS AND MEDICAL HISTORY: 1 mg Versed, 100 mcg fentanyl  ANESTHESIA/SEDATION: 55 min  CONTRAST:  129mL OMNIPAQUE IOHEXOL 300 MG/ML  SOLN  COMPLICATIONS: No immediate  PROCEDURE: Informed consent was obtained from the patient following explanation of the procedure, risks,  benefits and alternatives. The patient understands, agrees and consents for the procedure. All questions were addressed. A time out was performed.  Maximal barrier sterile technique utilized including caps, mask, sterile gowns, sterile gloves, large sterile drape, hand hygiene, and ChloraPrep.  Under sterile conditions and local anesthesia, ultrasound micropuncture access was performed of  the left common femoral artery. Guidewire inserted followed by a 5 fr sheath. Pigtail catheter advanced to the aortic bifurcation. Flush pelvic angiogram performed.  Pelvic angiogram: Diffuse iliac calcific atherosclerosis. The distal aortic bifurcation and pelvic iliac vessels are widely patent. The common, internal, and external iliac arteries are patent. Visualized femoral vessels are patent in the inguinal regions. In the right hemipelvis, there is contrast extravasation compatible with active arterial bleeding from a small branch originating peripherally off of the internal iliac vasculature.  For further investigation, catheter was exchanged for a C2 catheter. This catheter was advanced over the bifurcation into the right internal iliac artery. Selective right internal iliac angiogram performed.  Right Internal iliac artery: Right internal iliac artery is patent. Anterior and posterior divisions are patent. Active arterial bleeding demonstrated in the inferior right hemipelvis from a small peripheral branch either from the obturator or pudendal arteries.  From this location, a Renegade micro catheter was advanced through the C2 catheter over a micro glidewire. Initially the catheter was advanced into the right inferior gluteal artery. Selective angiogram of the inferior gluteal artery did not demonstrate any active bleeding.  Micro catheter was retracted and advanced into the internal pudendal artery. Selective angiogram demonstrates patency of the internal pudendal artery without active bleeding.  Catheter was retracted and  advanced into the right obturator artery. Selective right obturator angiogram demonstrates active bleeding from a small branch off of the right obturator artery along the right superior ramus. This correlates with the active bleeding site by CT.  Embolization: From this peripheral location within the right obturator artery, initially Gel-Foam embolization was performed along the right superior ramus fracture. Additionally, 4 3 mm x 6cm Interlock micro coils were deployed peripherally in the right obturator artery across the small bleeding obturator branch.  Final post embolization angiogram demonstrates occlusion of the peripheral obturarot artery without any additional active arterial bleeding.  Micro catheter was retracted into the internal iliac artery. Additional internal iliac angiogram demonstrates no additional active bleeding site. Micro catheter was removed. Additional right internal iliac angiogram performed through the 5 French catheter. Again, no additional right hemipelvis bleeding site.  Access was removed. Hemostasis obtained with Exoseal device at the left common femoral artery access site. Patient tolerated the procedure well. No immediate complication.  IMPRESSION: Right hemipelvis active arterial bleeding from a small peripheral branch off of the right obturator artery at the level of the right superior ramus fracture. This was successfully embolized with Gelfoam and four 3 mm micro coils peripherally within the right obturator artery.  No additional right hemipelvis active bleeding site demonstrated post embolization.  Findings discussed with Dr. Grandville Silos following the procedure.   Electronically Signed   By: Daryll Brod M.D.   On: 11/16/2013 09:30   Dg Pelvis Portable  11/15/2013   CLINICAL DATA:  Pt trampled by cow, pain mostly in right hip area, low bp  EXAM: PORTABLE PELVIS 1-2 VIEWS  COMPARISON:  None.  FINDINGS: An area of cortical irregularity, with a possible avulsion component, is  appreciated along the lateral aspect of the right iliac wing. There is overlying soft tissue swelling, concerning for hematoma.  A nondisplaced fracture with impaction is appreciated along the distal aspect of the superior pubic ramus on the right.  An oblique lucency projects along the body of the superior pubic ramus on the left differential considerations Mach line versus fracture.  Degenerative changes appreciated within the lower lumbar spine and right and left hips. Dystrophic calcifications overlying the  pubis region on the left.  Bones osteopenic.  IMPRESSION: 1. Superior pubic ramus fracture on the right 2. Findings concerning for avulsion fracture lateral aspect of the iliac wing on the right. With findings also concerning for an overlying hematoma. 3. These results were called by telephone at the time of interpretation on 11/15/2013 at 4:33 PM to Dr. Leonard Schwartz , who verbally acknowledged these results. 4. Lucency of the body of the superior pubic ramus on the left fracture versus Mach line   Electronically Signed   By: Margaree Mackintosh M.D.   On: 11/15/2013 16:35   Ct Femur Right W Contrast  11/15/2013   CLINICAL DATA:  Recent traumatic injury. Patient's stepped on by a cow  EXAM: CT OF THE RIGHT LOWER EXTREMITY WITH CONTRAST  TECHNIQUE: Contiguous axial images were obtained to the right lower extremity from the pelvis to the level of the knee joint following contrast administration. Sagittal and coronal reconstructions were obtained.  CONTRAST:  113mL OMNIPAQUE IOHEXOL 300 MG/ML  SOLN  COMPARISON:  None.  FINDINGS: There again noted changes consistent with superior pubic ramus fracture on the right. The femur is well visualized and shows no evidence of acute fracture. The soft tissue structures shown the musculature of the thigh to be within normal limits. No definitive muscular hematomas are seen. Soft tissue fluid attenuation is identified consistent with localized subcutaneous hematoma. No  definitive active extravasation is noted however. The large pelvic hematoma is again identified and stable.  IMPRESSION: No evidence of femoral fracture.  Right superior pubic ramus fracture.  Soft tissue changes are noted in the medial thigh consistent with localized hemorrhage although no active extravasation is noted. No hematoma is noted within the musculature.   Electronically Signed   By: Inez Catalina M.D.   On: 11/15/2013 18:12   Ir US Guide Vasc Access Left  11/16/2013   CLINICAL DATA:  Traumatic right pelvic iliac and rami fractures with a large pelvic retroperitoneal hematoma and active bleeding by CT.  EXAM: ULTRASOUND GUIDANCE FOR VASCULAR ACCESS  PELVIC FLUSH AORTOGRAM  SELECTIVE RIGHT INTERNAL ILIAC, INFERIOR GLUTEAL, PUDENDAL, AND OBTURATOR ARTERIAL CATHETERIZATIONS AND ANGIOGRAMS  PERIPHERAL RIGHT OBTURATOR ARTERY PARTICLE AND COIL EMBOLIZATION FOR ACTIVE BLEEDING  Date:  4/3/20154/10/2013 11:28 PM  Radiologist:  Jerilynn Mages. Daryll Brod, MD  Guidance:  Ultrasound fluoroscopic  FLUOROSCOPY TIME:  9 min 6 seconds  MEDICATIONS AND MEDICAL HISTORY: 1 mg Versed, 100 mcg fentanyl  ANESTHESIA/SEDATION: 55 min  CONTRAST:  140mL OMNIPAQUE IOHEXOL 300 MG/ML  SOLN  COMPLICATIONS: No immediate  PROCEDURE: Informed consent was obtained from the patient following explanation of the procedure, risks, benefits and alternatives. The patient understands, agrees and consents for the procedure. All questions were addressed. A time out was performed.  Maximal barrier sterile technique utilized including caps, mask, sterile gowns, sterile gloves, large sterile drape, hand hygiene, and ChloraPrep.  Under sterile conditions and local anesthesia, ultrasound micropuncture access was performed of the left common femoral artery. Guidewire inserted followed by a 5 fr sheath. Pigtail catheter advanced to the aortic bifurcation. Flush pelvic angiogram performed.  Pelvic angiogram: Diffuse iliac calcific atherosclerosis. The distal aortic  bifurcation and pelvic iliac vessels are widely patent. The common, internal, and external iliac arteries are patent. Visualized femoral vessels are patent in the inguinal regions. In the right hemipelvis, there is contrast extravasation compatible with active arterial bleeding from a small branch originating peripherally off of the internal iliac vasculature.  For further investigation, catheter was exchanged for a  C2 catheter. This catheter was advanced over the bifurcation into the right internal iliac artery. Selective right internal iliac angiogram performed.  Right Internal iliac artery: Right internal iliac artery is patent. Anterior and posterior divisions are patent. Active arterial bleeding demonstrated in the inferior right hemipelvis from a small peripheral branch either from the obturator or pudendal arteries.  From this location, a Renegade micro catheter was advanced through the C2 catheter over a micro glidewire. Initially the catheter was advanced into the right inferior gluteal artery. Selective angiogram of the inferior gluteal artery did not demonstrate any active bleeding.  Micro catheter was retracted and advanced into the internal pudendal artery. Selective angiogram demonstrates patency of the internal pudendal artery without active bleeding.  Catheter was retracted and advanced into the right obturator artery. Selective right obturator angiogram demonstrates active bleeding from a small branch off of the right obturator artery along the right superior ramus. This correlates with the active bleeding site by CT.  Embolization: From this peripheral location within the right obturator artery, initially Gel-Foam embolization was performed along the right superior ramus fracture. Additionally, 4 3 mm x 6cm Interlock micro coils were deployed peripherally in the right obturator artery across the small bleeding obturator branch.  Final post embolization angiogram demonstrates occlusion of the  peripheral obturarot artery without any additional active arterial bleeding.  Micro catheter was retracted into the internal iliac artery. Additional internal iliac angiogram demonstrates no additional active bleeding site. Micro catheter was removed. Additional right internal iliac angiogram performed through the 5 French catheter. Again, no additional right hemipelvis bleeding site.  Access was removed. Hemostasis obtained with Exoseal device at the left common femoral artery access site. Patient tolerated the procedure well. No immediate complication.  IMPRESSION: Right hemipelvis active arterial bleeding from a small peripheral branch off of the right obturator artery at the level of the right superior ramus fracture. This was successfully embolized with Gelfoam and four 3 mm micro coils peripherally within the right obturator artery.  No additional right hemipelvis active bleeding site demonstrated post embolization.  Findings discussed with Dr. Grandville Silos following the procedure.   Electronically Signed   By: Daryll Brod M.D.   On: 11/16/2013 09:30   Dg Chest Port 1 View  11/15/2013   CLINICAL DATA:  Pt trampled by cow, pain mostly in right hip area, low bp  EXAM: PORTABLE CHEST - 1 VIEW  COMPARISON:  CT ANGIO CHEST W/CM &/OR WO/CM dated 10/03/2012; DG CHEST 2 VIEW dated 07/07/2007  FINDINGS: Low lung volumes. Cardiac silhouette enlarged. Aorta tortuous and ectatic. Lungs are clear. Degenerative changes right and left shoulders osseous structures otherwise unremarkable. There is no evidence of a pneumothorax.  IMPRESSION: No acute cardiopulmonary disease.   Electronically Signed   By: Margaree Mackintosh M.D.   On: 11/15/2013 16:25   [place] Guide Roadmapping  11/16/2013   CLINICAL DATA:  Traumatic right pelvic iliac and rami fractures with a large pelvic retroperitoneal hematoma and active bleeding by CT.  EXAM: ULTRASOUND GUIDANCE FOR VASCULAR ACCESS  PELVIC FLUSH AORTOGRAM   SELECTIVE RIGHT INTERNAL ILIAC, INFERIOR GLUTEAL, PUDENDAL, AND OBTURATOR ARTERIAL CATHETERIZATIONS AND ANGIOGRAMS  PERIPHERAL RIGHT OBTURATOR ARTERY PARTICLE AND COIL EMBOLIZATION FOR ACTIVE BLEEDING  Date:  4/3/20154/10/2013 11:28 PM  Radiologist:  Jerilynn Mages. Daryll Brod, MD  Guidance:  Ultrasound fluoroscopic  FLUOROSCOPY TIME:  9 min 6 seconds  MEDICATIONS AND MEDICAL HISTORY: 1 mg Versed, 100 mcg fentanyl  ANESTHESIA/SEDATION: 55  min  CONTRAST:  148mL OMNIPAQUE IOHEXOL 300 MG/ML  SOLN  COMPLICATIONS: No immediate  PROCEDURE: Informed consent was obtained from the patient following explanation of the procedure, risks, benefits and alternatives. The patient understands, agrees and consents for the procedure. All questions were addressed. A time out was performed.  Maximal barrier sterile technique utilized including caps, mask, sterile gowns, sterile gloves, large sterile drape, hand hygiene, and ChloraPrep.  Under sterile conditions and local anesthesia, ultrasound micropuncture access was performed of the left common femoral artery. Guidewire inserted followed by a 5 fr sheath. Pigtail catheter advanced to the aortic bifurcation. Flush pelvic angiogram performed.  Pelvic angiogram: Diffuse iliac calcific atherosclerosis. The distal aortic bifurcation and pelvic iliac vessels are widely patent. The common, internal, and external iliac arteries are patent. Visualized femoral vessels are patent in the inguinal regions. In the right hemipelvis, there is contrast extravasation compatible with active arterial bleeding from a small branch originating peripherally off of the internal iliac vasculature.  For further investigation, catheter was exchanged for a C2 catheter. This catheter was advanced over the bifurcation into the right internal iliac artery. Selective right internal iliac angiogram performed.  Right Internal iliac artery: Right internal iliac artery is patent. Anterior and posterior divisions are patent. Active  arterial bleeding demonstrated in the inferior right hemipelvis from a small peripheral branch either from the obturator or pudendal arteries.  From this location, a Renegade micro catheter was advanced through the C2 catheter over a micro glidewire. Initially the catheter was advanced into the right inferior gluteal artery. Selective angiogram of the inferior gluteal artery did not demonstrate any active bleeding.  Micro catheter was retracted and advanced into the internal pudendal artery. Selective angiogram demonstrates patency of the internal pudendal artery without active bleeding.  Catheter was retracted and advanced into the right obturator artery. Selective right obturator angiogram demonstrates active bleeding from a small branch off of the right obturator artery along the right superior ramus. This correlates with the active bleeding site by CT.  Embolization: From this peripheral location within the right obturator artery, initially Gel-Foam embolization was performed along the right superior ramus fracture. Additionally, 4 3 mm x 6cm Interlock micro coils were deployed peripherally in the right obturator artery across the small bleeding obturator branch.  Final post embolization angiogram demonstrates occlusion of the peripheral obturarot artery without any additional active arterial bleeding.  Micro catheter was retracted into the internal iliac artery. Additional internal iliac angiogram demonstrates no additional active bleeding site. Micro catheter was removed. Additional right internal iliac angiogram performed through the 5 French catheter. Again, no additional right hemipelvis bleeding site.  Access was removed. Hemostasis obtained with Exoseal device at the left common femoral artery access site. Patient tolerated the procedure well. No immediate complication.  IMPRESSION: Right hemipelvis active arterial bleeding from a small peripheral branch off of the right obturator artery at the level of  the right superior ramus fracture. This was successfully embolized with Gelfoam and four 3 mm micro coils peripherally within the right obturator artery.  No additional right hemipelvis active bleeding site demonstrated post embolization.  Findings discussed with Dr. Grandville Silos following the procedure.   Electronically Signed   By: Daryll Brod M.D.   On: 11/16/2013 09:30    Anti-infectives: Anti-infectives   None      Assessment/Plan: s/p * No surgery found *  Pelvic injury Rt obturator artery embo 4/3 Doing well Plan per trauma   LOS: 2 days    [facility]  A 11/17/2013

## 2013-11-17 NOTE — Progress Notes (Addendum)
Wasted 50 mcg of Fentanyl that patient refused at 2235 on 11/16/2013. Waste was witnessed. Marya Amsler, RN to sign on addendum as witness.   CRAVER, CARRIE GARNER  I witnessed the waste of 50 mcg of Fentanyl.  Terri Piedra, RN

## 2013-11-17 NOTE — Progress Notes (Signed)
Patient ID: Lee Allen, male   DOB: [birth year], 80 y.o.   MRN: [id number]    Subjective: Up in chair, sore  Objective: Vital signs in last 24 hours: Temp:  [97.5 F (36.4 C)-99.8 F (37.7 C)] 98.6 F (37 C) (04/05 0704) Pulse Rate:  [75-100] 93 (04/05 1000) Resp:  [18-26] 22 (04/05 1000) BP: (97-154)/(52-82) 125/68 mmHg (04/05 1000) SpO2:  [87 %-100 %] 92 % (04/05 1000) Arterial Line BP: (141-149)/(68-71) 144/68 mmHg (04/04 1400)    Intake/Output from previous day: 04/04 0701 - 04/05 0700 In: 2531.3 [P.O.:820; I.V.:1378.8; Blood:332.5] Out: 1047 [[id number]] Intake/Output this shift: Total I/O In: 400 [P.O.:250; I.V.:150] Out: 240 [Urine:240]  General appearance: cooperative Resp: few rales B Cardio: irregularly irregular rhythm GI: some distention but soft and +BS, tender R iliac Extremities: evolving abrasion and hematoma R thigh Neurologic: Mental status: Alert, oriented, thought content appropriate  Lab Results: CBC   Recent Labs  11/16/13 1945 11/17/13 0235  WBC 7.8 5.1  HGB 7.3* 6.5*  HCT 21.6* 19.7*  PLT 96* 72*   BMET  Recent Labs  11/15/13 1455 11/15/13 1608 11/16/13 0620  NA 143 147 142  K 3.9 3.8 4.2  CL 107 111 110  CO2 21  --  19  GLUCOSE 200* 143* 165*  BUN 27* 23 25*  CREATININE 1.20 1.20 1.06  CALCIUM 9.3  --  7.8*   PT/INR  Recent Labs  11/16/13 1030 11/17/13 0235  LABPROT 14.5 14.6  INR 1.15 1.16   ABG No results found for this basename: PHART, PCO2, PO2, HCO3,  in the last 72 hours  Studies/Results: Ct Abdomen Pelvis W Contrast  11/15/2013   CLINICAL DATA:  Recent traumatic injury, stepped on by a cow with pelvic pain  EXAM: CT ABDOMEN AND PELVIS WITH CONTRAST  TECHNIQUE: Multidetector CT imaging of the abdomen and pelvis was performed using the standard protocol following bolus administration of intravenous contrast.  CONTRAST:  172mL OMNIPAQUE IOHEXOL 300 MG/ML  SOLN  COMPARISON:  10/03/2012, 06/26/2007  FINDINGS: Lung  bases are free of acute infiltrate or sizable effusion.  The gallbladder is well distended with multiple small gallstones within. The liver demonstrates a rounded area of decreased attenuation along the lateral aspect of the right lobe measuring 2.2 cm. This may represent a cyst but was not present on a recent exam dated 10/03/2012. Given the recent injury to an adjacent perihepatic fluid would be difficult to exclude a small hepatic laceration in this region. No definitive rib fractures noted in this region.  The spleen, adrenal glands and pancreas are within normal limits with the exception of a small cyst within the body of the pancreas. The kidneys demonstrate multiple cysts bilaterally. No findings to suggest complicated cysts are identified. Delayed images through the kidneys demonstrate normal excretion of contrast material.  In the pelvis there is a large hematoma identified along the lateral pelvic wall on the right. It measures approximately 15 x 5.5 cm in greatest AP and transverse dimensions and extends for approximately be 16 cm 10 cm in the craniocaudad plane. There are areas of the active extravasation within this hematoma in the midportion at the level of the acetabulum. This displaces the urinary bladder which is decompressed by Foley catheter. Multiple prostate calcifications are identified. Comminuted fracture of the right iliac wing is noted with enlargement of the iliacus muscle on the right consistent with intramuscular hematoma. No active extravasation is noted in this region. Diffuse prostatic calcifications are noted. There is a  superior pubic ramus fracture identified on the right as well. No other definitive fractures are seen.  IMPRESSION: Changes consistent with a recent injury with fractures of the right iliac wing and right superior pubic ramus. There associated muscular hematoma within the right iliacus muscle as well as a large pelvic hematoma with active extravasation as described.   Multiple cystic lesions within the kidneys and pancreas.  Cholelithiasis.  Hypodensity within the liver with adjacent perihepatic fluid. Given the history a would be difficult to exclude a small laceration in this region although no active extravasation is seen.  Critical Value/emergent results were discussed in person at the time of exam performance on 11/15/2013 at 530 PM to Dr. Georganna Skeans, who verbally acknowledged these results.   Electronically Signed   By: Inez Catalina M.D.   On: 11/15/2013 18:10   Ir Angiogram Pelvis Selective Or Supraselective  11/16/2013   CLINICAL DATA:  Traumatic right pelvic iliac and rami fractures with a large pelvic retroperitoneal hematoma and active bleeding by CT.  EXAM: ULTRASOUND GUIDANCE FOR VASCULAR ACCESS  PELVIC FLUSH AORTOGRAM  SELECTIVE RIGHT INTERNAL ILIAC, INFERIOR GLUTEAL, PUDENDAL, AND OBTURATOR ARTERIAL CATHETERIZATIONS AND ANGIOGRAMS  PERIPHERAL RIGHT OBTURATOR ARTERY PARTICLE AND COIL EMBOLIZATION FOR ACTIVE BLEEDING  Date:  4/3/20154/10/2013 11:28 PM  Radiologist:  Jerilynn Mages. Daryll Brod, MD  Guidance:  Ultrasound fluoroscopic  FLUOROSCOPY TIME:  9 min 6 seconds  MEDICATIONS AND MEDICAL HISTORY: 1 mg Versed, 100 mcg fentanyl  ANESTHESIA/SEDATION: 55 min  CONTRAST:  16mL OMNIPAQUE IOHEXOL 300 MG/ML  SOLN  COMPLICATIONS: No immediate  PROCEDURE: Informed consent was obtained from the patient following explanation of the procedure, risks, benefits and alternatives. The patient understands, agrees and consents for the procedure. All questions were addressed. A time out was performed.  Maximal barrier sterile technique utilized including caps, mask, sterile gowns, sterile gloves, large sterile drape, hand hygiene, and ChloraPrep.  Under sterile conditions and local anesthesia, ultrasound micropuncture access was performed of the left common femoral artery. Guidewire inserted followed by a 5 fr sheath. Pigtail catheter advanced to the aortic bifurcation. Flush pelvic  angiogram performed.  Pelvic angiogram: Diffuse iliac calcific atherosclerosis. The distal aortic bifurcation and pelvic iliac vessels are widely patent. The common, internal, and external iliac arteries are patent. Visualized femoral vessels are patent in the inguinal regions. In the right hemipelvis, there is contrast extravasation compatible with active arterial bleeding from a small branch originating peripherally off of the internal iliac vasculature.  For further investigation, catheter was exchanged for a C2 catheter. This catheter was advanced over the bifurcation into the right internal iliac artery. Selective right internal iliac angiogram performed.  Right Internal iliac artery: Right internal iliac artery is patent. Anterior and posterior divisions are patent. Active arterial bleeding demonstrated in the inferior right hemipelvis from a small peripheral branch either from the obturator or pudendal arteries.  From this location, a Renegade micro catheter was advanced through the C2 catheter over a micro glidewire. Initially the catheter was advanced into the right inferior gluteal artery. Selective angiogram of the inferior gluteal artery did not demonstrate any active bleeding.  Micro catheter was retracted and advanced into the internal pudendal artery. Selective angiogram demonstrates patency of the internal pudendal artery without active bleeding.  Catheter was retracted and advanced into the right obturator artery. Selective right obturator angiogram demonstrates active bleeding from a small branch off of the right obturator artery along the right superior ramus. This correlates with the active bleeding site by CT.  Embolization: From this peripheral location within the right obturator artery, initially Gel-Foam embolization was performed along the right superior ramus fracture. Additionally, 4 3 mm x 6cm Interlock micro coils were deployed peripherally in the right obturator artery across the small  bleeding obturator branch.  Final post embolization angiogram demonstrates occlusion of the peripheral obturarot artery without any additional active arterial bleeding.  Micro catheter was retracted into the internal iliac artery. Additional internal iliac angiogram demonstrates no additional active bleeding site. Micro catheter was removed. Additional right internal iliac angiogram performed through the 5 French catheter. Again, no additional right hemipelvis bleeding site.  Access was removed. Hemostasis obtained with Exoseal device at the left common femoral artery access site. Patient tolerated the procedure well. No immediate complication.  IMPRESSION: Right hemipelvis active arterial bleeding from a small peripheral branch off of the right obturator artery at the level of the right superior ramus fracture. This was successfully embolized with Gelfoam and four 3 mm micro coils peripherally within the right obturator artery.  No additional right hemipelvis active bleeding site demonstrated post embolization.  Findings discussed with Dr. Grandville Silos following the procedure.   Electronically Signed   By: Daryll Brod M.D.   On: 11/16/2013 09:30   Ir Angiogram Selective Each Additional Vessel  11/16/2013   CLINICAL DATA:  Traumatic right pelvic iliac and rami fractures with a large pelvic retroperitoneal hematoma and active bleeding by CT.  EXAM: ULTRASOUND GUIDANCE FOR VASCULAR ACCESS  PELVIC FLUSH AORTOGRAM  SELECTIVE RIGHT INTERNAL ILIAC, INFERIOR GLUTEAL, PUDENDAL, AND OBTURATOR ARTERIAL CATHETERIZATIONS AND ANGIOGRAMS  PERIPHERAL RIGHT OBTURATOR ARTERY PARTICLE AND COIL EMBOLIZATION FOR ACTIVE BLEEDING  Date:  4/3/20154/10/2013 11:28 PM  Radiologist:  Jerilynn Mages. Daryll Brod, MD  Guidance:  Ultrasound fluoroscopic  FLUOROSCOPY TIME:  9 min 6 seconds  MEDICATIONS AND MEDICAL HISTORY: 1 mg Versed, 100 mcg fentanyl  ANESTHESIA/SEDATION: 55 min  CONTRAST:  127mL OMNIPAQUE IOHEXOL 300 MG/ML  SOLN  COMPLICATIONS: No  immediate  PROCEDURE: Informed consent was obtained from the patient following explanation of the procedure, risks, benefits and alternatives. The patient understands, agrees and consents for the procedure. All questions were addressed. A time out was performed.  Maximal barrier sterile technique utilized including caps, mask, sterile gowns, sterile gloves, large sterile drape, hand hygiene, and ChloraPrep.  Under sterile conditions and local anesthesia, ultrasound micropuncture access was performed of the left common femoral artery. Guidewire inserted followed by a 5 fr sheath. Pigtail catheter advanced to the aortic bifurcation. Flush pelvic angiogram performed.  Pelvic angiogram: Diffuse iliac calcific atherosclerosis. The distal aortic bifurcation and pelvic iliac vessels are widely patent. The common, internal, and external iliac arteries are patent. Visualized femoral vessels are patent in the inguinal regions. In the right hemipelvis, there is contrast extravasation compatible with active arterial bleeding from a small branch originating peripherally off of the internal iliac vasculature.  For further investigation, catheter was exchanged for a C2 catheter. This catheter was advanced over the bifurcation into the right internal iliac artery. Selective right internal iliac angiogram performed.  Right Internal iliac artery: Right internal iliac artery is patent. Anterior and posterior divisions are patent. Active arterial bleeding demonstrated in the inferior right hemipelvis from a small peripheral branch either from the obturator or pudendal arteries.  From this location, a Renegade micro catheter was advanced through the C2 catheter over a micro glidewire. Initially the catheter was advanced into the right inferior gluteal artery. Selective angiogram of the inferior gluteal artery did not demonstrate any active  bleeding.  Micro catheter was retracted and advanced into the internal pudendal artery. Selective  angiogram demonstrates patency of the internal pudendal artery without active bleeding.  Catheter was retracted and advanced into the right obturator artery. Selective right obturator angiogram demonstrates active bleeding from a small branch off of the right obturator artery along the right superior ramus. This correlates with the active bleeding site by CT.  Embolization: From this peripheral location within the right obturator artery, initially Gel-Foam embolization was performed along the right superior ramus fracture. Additionally, 4 3 mm x 6cm Interlock micro coils were deployed peripherally in the right obturator artery across the small bleeding obturator branch.  Final post embolization angiogram demonstrates occlusion of the peripheral obturarot artery without any additional active arterial bleeding.  Micro catheter was retracted into the internal iliac artery. Additional internal iliac angiogram demonstrates no additional active bleeding site. Micro catheter was removed. Additional right internal iliac angiogram performed through the 5 French catheter. Again, no additional right hemipelvis bleeding site.  Access was removed. Hemostasis obtained with Exoseal device at the left common femoral artery access site. Patient tolerated the procedure well. No immediate complication.  IMPRESSION: Right hemipelvis active arterial bleeding from a small peripheral branch off of the right obturator artery at the level of the right superior ramus fracture. This was successfully embolized with Gelfoam and four 3 mm micro coils peripherally within the right obturator artery.  No additional right hemipelvis active bleeding site demonstrated post embolization.  Findings discussed with Dr. Grandville Silos following the procedure.   Electronically Signed   By: Daryll Brod M.D.   On: 11/16/2013 09:30   Ir Angiogram Selective Each Additional Vessel  11/16/2013   CLINICAL DATA:  Traumatic right pelvic iliac and rami fractures with a  large pelvic retroperitoneal hematoma and active bleeding by CT.  EXAM: ULTRASOUND GUIDANCE FOR VASCULAR ACCESS  PELVIC FLUSH AORTOGRAM  SELECTIVE RIGHT INTERNAL ILIAC, INFERIOR GLUTEAL, PUDENDAL, AND OBTURATOR ARTERIAL CATHETERIZATIONS AND ANGIOGRAMS  PERIPHERAL RIGHT OBTURATOR ARTERY PARTICLE AND COIL EMBOLIZATION FOR ACTIVE BLEEDING  Date:  4/3/20154/10/2013 11:28 PM  Radiologist:  Jerilynn Mages. Daryll Brod, MD  Guidance:  Ultrasound fluoroscopic  FLUOROSCOPY TIME:  9 min 6 seconds  MEDICATIONS AND MEDICAL HISTORY: 1 mg Versed, 100 mcg fentanyl  ANESTHESIA/SEDATION: 55 min  CONTRAST:  128mL OMNIPAQUE IOHEXOL 300 MG/ML  SOLN  COMPLICATIONS: No immediate  PROCEDURE: Informed consent was obtained from the patient following explanation of the procedure, risks, benefits and alternatives. The patient understands, agrees and consents for the procedure. All questions were addressed. A time out was performed.  Maximal barrier sterile technique utilized including caps, mask, sterile gowns, sterile gloves, large sterile drape, hand hygiene, and ChloraPrep.  Under sterile conditions and local anesthesia, ultrasound micropuncture access was performed of the left common femoral artery. Guidewire inserted followed by a 5 fr sheath. Pigtail catheter advanced to the aortic bifurcation. Flush pelvic angiogram performed.  Pelvic angiogram: Diffuse iliac calcific atherosclerosis. The distal aortic bifurcation and pelvic iliac vessels are widely patent. The common, internal, and external iliac arteries are patent. Visualized femoral vessels are patent in the inguinal regions. In the right hemipelvis, there is contrast extravasation compatible with active arterial bleeding from a small branch originating peripherally off of the internal iliac vasculature.  For further investigation, catheter was exchanged for a C2 catheter. This catheter was advanced over the bifurcation into the right internal iliac artery. Selective right internal iliac  angiogram performed.  Right Internal iliac artery: Right internal iliac artery is  patent. Anterior and posterior divisions are patent. Active arterial bleeding demonstrated in the inferior right hemipelvis from a small peripheral branch either from the obturator or pudendal arteries.  From this location, a Renegade micro catheter was advanced through the C2 catheter over a micro glidewire. Initially the catheter was advanced into the right inferior gluteal artery. Selective angiogram of the inferior gluteal artery did not demonstrate any active bleeding.  Micro catheter was retracted and advanced into the internal pudendal artery. Selective angiogram demonstrates patency of the internal pudendal artery without active bleeding.  Catheter was retracted and advanced into the right obturator artery. Selective right obturator angiogram demonstrates active bleeding from a small branch off of the right obturator artery along the right superior ramus. This correlates with the active bleeding site by CT.  Embolization: From this peripheral location within the right obturator artery, initially Gel-Foam embolization was performed along the right superior ramus fracture. Additionally, 4 3 mm x 6cm Interlock micro coils were deployed peripherally in the right obturator artery across the small bleeding obturator branch.  Final post embolization angiogram demonstrates occlusion of the peripheral obturarot artery without any additional active arterial bleeding.  Micro catheter was retracted into the internal iliac artery. Additional internal iliac angiogram demonstrates no additional active bleeding site. Micro catheter was removed. Additional right internal iliac angiogram performed through the 5 French catheter. Again, no additional right hemipelvis bleeding site.  Access was removed. Hemostasis obtained with Exoseal device at the left common femoral artery access site. Patient tolerated the procedure well. No immediate  complication.  IMPRESSION: Right hemipelvis active arterial bleeding from a small peripheral branch off of the right obturator artery at the level of the right superior ramus fracture. This was successfully embolized with Gelfoam and four 3 mm micro coils peripherally within the right obturator artery.  No additional right hemipelvis active bleeding site demonstrated post embolization.  Findings discussed with Dr. Grandville Silos following the procedure.   Electronically Signed   By: Daryll Brod M.D.   On: 11/16/2013 09:30   Ir Angiogram Selective Each Additional Vessel  11/16/2013   CLINICAL DATA:  Traumatic right pelvic iliac and rami fractures with a large pelvic retroperitoneal hematoma and active bleeding by CT.  EXAM: ULTRASOUND GUIDANCE FOR VASCULAR ACCESS  PELVIC FLUSH AORTOGRAM  SELECTIVE RIGHT INTERNAL ILIAC, INFERIOR GLUTEAL, PUDENDAL, AND OBTURATOR ARTERIAL CATHETERIZATIONS AND ANGIOGRAMS  PERIPHERAL RIGHT OBTURATOR ARTERY PARTICLE AND COIL EMBOLIZATION FOR ACTIVE BLEEDING  Date:  4/3/20154/10/2013 11:28 PM  Radiologist:  Jerilynn Mages. Daryll Brod, MD  Guidance:  Ultrasound fluoroscopic  FLUOROSCOPY TIME:  9 min 6 seconds  MEDICATIONS AND MEDICAL HISTORY: 1 mg Versed, 100 mcg fentanyl  ANESTHESIA/SEDATION: 55 min  CONTRAST:  170mL OMNIPAQUE IOHEXOL 300 MG/ML  SOLN  COMPLICATIONS: No immediate  PROCEDURE: Informed consent was obtained from the patient following explanation of the procedure, risks, benefits and alternatives. The patient understands, agrees and consents for the procedure. All questions were addressed. A time out was performed.  Maximal barrier sterile technique utilized including caps, mask, sterile gowns, sterile gloves, large sterile drape, hand hygiene, and ChloraPrep.  Under sterile conditions and local anesthesia, ultrasound micropuncture access was performed of the left common femoral artery. Guidewire inserted followed by a 5 fr sheath. Pigtail catheter advanced to the aortic bifurcation. Flush  pelvic angiogram performed.  Pelvic angiogram: Diffuse iliac calcific atherosclerosis. The distal aortic bifurcation and pelvic iliac vessels are widely patent. The common, internal, and external iliac arteries are patent. Visualized femoral vessels are patent in  the inguinal regions. In the right hemipelvis, there is contrast extravasation compatible with active arterial bleeding from a small branch originating peripherally off of the internal iliac vasculature.  For further investigation, catheter was exchanged for a C2 catheter. This catheter was advanced over the bifurcation into the right internal iliac artery. Selective right internal iliac angiogram performed.  Right Internal iliac artery: Right internal iliac artery is patent. Anterior and posterior divisions are patent. Active arterial bleeding demonstrated in the inferior right hemipelvis from a small peripheral branch either from the obturator or pudendal arteries.  From this location, a Renegade micro catheter was advanced through the C2 catheter over a micro glidewire. Initially the catheter was advanced into the right inferior gluteal artery. Selective angiogram of the inferior gluteal artery did not demonstrate any active bleeding.  Micro catheter was retracted and advanced into the internal pudendal artery. Selective angiogram demonstrates patency of the internal pudendal artery without active bleeding.  Catheter was retracted and advanced into the right obturator artery. Selective right obturator angiogram demonstrates active bleeding from a small branch off of the right obturator artery along the right superior ramus. This correlates with the active bleeding site by CT.  Embolization: From this peripheral location within the right obturator artery, initially Gel-Foam embolization was performed along the right superior ramus fracture. Additionally, 4 3 mm x 6cm Interlock micro coils were deployed peripherally in the right obturator artery across the  small bleeding obturator branch.  Final post embolization angiogram demonstrates occlusion of the peripheral obturarot artery without any additional active arterial bleeding.  Micro catheter was retracted into the internal iliac artery. Additional internal iliac angiogram demonstrates no additional active bleeding site. Micro catheter was removed. Additional right internal iliac angiogram performed through the 5 French catheter. Again, no additional right hemipelvis bleeding site.  Access was removed. Hemostasis obtained with Exoseal device at the left common femoral artery access site. Patient tolerated the procedure well. No immediate complication.  IMPRESSION: Right hemipelvis active arterial bleeding from a small peripheral branch off of the right obturator artery at the level of the right superior ramus fracture. This was successfully embolized with Gelfoam and four 3 mm micro coils peripherally within the right obturator artery.  No additional right hemipelvis active bleeding site demonstrated post embolization.  Findings discussed with Dr. Grandville Silos following the procedure.   Electronically Signed   By: Daryll Brod M.D.   On: 11/16/2013 09:30   Ir Angiogram Follow Up Study  11/16/2013   CLINICAL DATA:  Traumatic right pelvic iliac and rami fractures with a large pelvic retroperitoneal hematoma and active bleeding by CT.  EXAM: ULTRASOUND GUIDANCE FOR VASCULAR ACCESS  PELVIC FLUSH AORTOGRAM  SELECTIVE RIGHT INTERNAL ILIAC, INFERIOR GLUTEAL, PUDENDAL, AND OBTURATOR ARTERIAL CATHETERIZATIONS AND ANGIOGRAMS  PERIPHERAL RIGHT OBTURATOR ARTERY PARTICLE AND COIL EMBOLIZATION FOR ACTIVE BLEEDING  Date:  4/3/20154/10/2013 11:28 PM  Radiologist:  Jerilynn Mages. Daryll Brod, MD  Guidance:  Ultrasound fluoroscopic  FLUOROSCOPY TIME:  9 min 6 seconds  MEDICATIONS AND MEDICAL HISTORY: 1 mg Versed, 100 mcg fentanyl  ANESTHESIA/SEDATION: 55 min  CONTRAST:  16mL OMNIPAQUE IOHEXOL 300 MG/ML  SOLN  COMPLICATIONS: No immediate   PROCEDURE: Informed consent was obtained from the patient following explanation of the procedure, risks, benefits and alternatives. The patient understands, agrees and consents for the procedure. All questions were addressed. A time out was performed.  Maximal barrier sterile technique utilized including caps, mask, sterile gowns, sterile gloves, large sterile drape, hand hygiene, and ChloraPrep.  Under sterile  conditions and local anesthesia, ultrasound micropuncture access was performed of the left common femoral artery. Guidewire inserted followed by a 5 fr sheath. Pigtail catheter advanced to the aortic bifurcation. Flush pelvic angiogram performed.  Pelvic angiogram: Diffuse iliac calcific atherosclerosis. The distal aortic bifurcation and pelvic iliac vessels are widely patent. The common, internal, and external iliac arteries are patent. Visualized femoral vessels are patent in the inguinal regions. In the right hemipelvis, there is contrast extravasation compatible with active arterial bleeding from a small branch originating peripherally off of the internal iliac vasculature.  For further investigation, catheter was exchanged for a C2 catheter. This catheter was advanced over the bifurcation into the right internal iliac artery. Selective right internal iliac angiogram performed.  Right Internal iliac artery: Right internal iliac artery is patent. Anterior and posterior divisions are patent. Active arterial bleeding demonstrated in the inferior right hemipelvis from a small peripheral branch either from the obturator or pudendal arteries.  From this location, a Renegade micro catheter was advanced through the C2 catheter over a micro glidewire. Initially the catheter was advanced into the right inferior gluteal artery. Selective angiogram of the inferior gluteal artery did not demonstrate any active bleeding.  Micro catheter was retracted and advanced into the internal pudendal artery. Selective angiogram  demonstrates patency of the internal pudendal artery without active bleeding.  Catheter was retracted and advanced into the right obturator artery. Selective right obturator angiogram demonstrates active bleeding from a small branch off of the right obturator artery along the right superior ramus. This correlates with the active bleeding site by CT.  Embolization: From this peripheral location within the right obturator artery, initially Gel-Foam embolization was performed along the right superior ramus fracture. Additionally, 4 3 mm x 6cm Interlock micro coils were deployed peripherally in the right obturator artery across the small bleeding obturator branch.  Final post embolization angiogram demonstrates occlusion of the peripheral obturarot artery without any additional active arterial bleeding.  Micro catheter was retracted into the internal iliac artery. Additional internal iliac angiogram demonstrates no additional active bleeding site. Micro catheter was removed. Additional right internal iliac angiogram performed through the 5 French catheter. Again, no additional right hemipelvis bleeding site.  Access was removed. Hemostasis obtained with Exoseal device at the left common femoral artery access site. Patient tolerated the procedure well. No immediate complication.  IMPRESSION: Right hemipelvis active arterial bleeding from a small peripheral branch off of the right obturator artery at the level of the right superior ramus fracture. This was successfully embolized with Gelfoam and four 3 mm micro coils peripherally within the right obturator artery.  No additional right hemipelvis active bleeding site demonstrated post embolization.  Findings discussed with Dr. Grandville Silos following the procedure.   Electronically Signed   By: Daryll Brod M.D.   On: 11/16/2013 09:30   Dg Pelvis Portable  11/15/2013   CLINICAL DATA:  Pt trampled by cow, pain mostly in right hip area, low bp  EXAM: PORTABLE PELVIS 1-2 VIEWS   COMPARISON:  None.  FINDINGS: An area of cortical irregularity, with a possible avulsion component, is appreciated along the lateral aspect of the right iliac wing. There is overlying soft tissue swelling, concerning for hematoma.  A nondisplaced fracture with impaction is appreciated along the distal aspect of the superior pubic ramus on the right.  An oblique lucency projects along the body of the superior pubic ramus on the left differential considerations Mach line versus fracture.  Degenerative changes appreciated within the lower lumbar  spine and right and left hips. Dystrophic calcifications overlying the pubis region on the left.  Bones osteopenic.  IMPRESSION: 1. Superior pubic ramus fracture on the right 2. Findings concerning for avulsion fracture lateral aspect of the iliac wing on the right. With findings also concerning for an overlying hematoma. 3. These results were called by telephone at the time of interpretation on 11/15/2013 at 4:33 PM to Dr. Leonard Schwartz , who verbally acknowledged these results. 4. Lucency of the body of the superior pubic ramus on the left fracture versus Mach line   Electronically Signed   By: Margaree Mackintosh M.D.   On: 11/15/2013 16:35   Ct Femur Right W Contrast  11/15/2013   CLINICAL DATA:  Recent traumatic injury. Patient's stepped on by a cow  EXAM: CT OF THE RIGHT LOWER EXTREMITY WITH CONTRAST  TECHNIQUE: Contiguous axial images were obtained to the right lower extremity from the pelvis to the level of the knee joint following contrast administration. Sagittal and coronal reconstructions were obtained.  CONTRAST:  169mL OMNIPAQUE IOHEXOL 300 MG/ML  SOLN  COMPARISON:  None.  FINDINGS: There again noted changes consistent with superior pubic ramus fracture on the right. The femur is well visualized and shows no evidence of acute fracture. The soft tissue structures shown the musculature of the thigh to be within normal limits. No definitive muscular hematomas are seen.  Soft tissue fluid attenuation is identified consistent with localized subcutaneous hematoma. No definitive active extravasation is noted however. The large pelvic hematoma is again identified and stable.  IMPRESSION: No evidence of femoral fracture.  Right superior pubic ramus fracture.  Soft tissue changes are noted in the medial thigh consistent with localized hemorrhage although no active extravasation is noted. No hematoma is noted within the musculature.   Electronically Signed   By: Inez Catalina M.D.   On: 11/15/2013 18:12   Ir US Guide Vasc Access Left  11/16/2013   CLINICAL DATA:  Traumatic right pelvic iliac and rami fractures with a large pelvic retroperitoneal hematoma and active bleeding by CT.  EXAM: ULTRASOUND GUIDANCE FOR VASCULAR ACCESS  PELVIC FLUSH AORTOGRAM  SELECTIVE RIGHT INTERNAL ILIAC, INFERIOR GLUTEAL, PUDENDAL, AND OBTURATOR ARTERIAL CATHETERIZATIONS AND ANGIOGRAMS  PERIPHERAL RIGHT OBTURATOR ARTERY PARTICLE AND COIL EMBOLIZATION FOR ACTIVE BLEEDING  Date:  4/3/20154/10/2013 11:28 PM  Radiologist:  Jerilynn Mages. Daryll Brod, MD  Guidance:  Ultrasound fluoroscopic  FLUOROSCOPY TIME:  9 min 6 seconds  MEDICATIONS AND MEDICAL HISTORY: 1 mg Versed, 100 mcg fentanyl  ANESTHESIA/SEDATION: 55 min  CONTRAST:  160mL OMNIPAQUE IOHEXOL 300 MG/ML  SOLN  COMPLICATIONS: No immediate  PROCEDURE: Informed consent was obtained from the patient following explanation of the procedure, risks, benefits and alternatives. The patient understands, agrees and consents for the procedure. All questions were addressed. A time out was performed.  Maximal barrier sterile technique utilized including caps, mask, sterile gowns, sterile gloves, large sterile drape, hand hygiene, and ChloraPrep.  Under sterile conditions and local anesthesia, ultrasound micropuncture access was performed of the left common femoral artery. Guidewire inserted followed by a 5 fr sheath. Pigtail catheter advanced to the aortic bifurcation. Flush pelvic  angiogram performed.  Pelvic angiogram: Diffuse iliac calcific atherosclerosis. The distal aortic bifurcation and pelvic iliac vessels are widely patent. The common, internal, and external iliac arteries are patent. Visualized femoral vessels are patent in the inguinal regions. In the right hemipelvis, there is contrast extravasation compatible with active arterial bleeding from a small branch originating peripherally off of the internal iliac  vasculature.  For further investigation, catheter was exchanged for a C2 catheter. This catheter was advanced over the bifurcation into the right internal iliac artery. Selective right internal iliac angiogram performed.  Right Internal iliac artery: Right internal iliac artery is patent. Anterior and posterior divisions are patent. Active arterial bleeding demonstrated in the inferior right hemipelvis from a small peripheral branch either from the obturator or pudendal arteries.  From this location, a Renegade micro catheter was advanced through the C2 catheter over a micro glidewire. Initially the catheter was advanced into the right inferior gluteal artery. Selective angiogram of the inferior gluteal artery did not demonstrate any active bleeding.  Micro catheter was retracted and advanced into the internal pudendal artery. Selective angiogram demonstrates patency of the internal pudendal artery without active bleeding.  Catheter was retracted and advanced into the right obturator artery. Selective right obturator angiogram demonstrates active bleeding from a small branch off of the right obturator artery along the right superior ramus. This correlates with the active bleeding site by CT.  Embolization: From this peripheral location within the right obturator artery, initially Gel-Foam embolization was performed along the right superior ramus fracture. Additionally, 4 3 mm x 6cm Interlock micro coils were deployed peripherally in the right obturator artery across the small  bleeding obturator branch.  Final post embolization angiogram demonstrates occlusion of the peripheral obturarot artery without any additional active arterial bleeding.  Micro catheter was retracted into the internal iliac artery. Additional internal iliac angiogram demonstrates no additional active bleeding site. Micro catheter was removed. Additional right internal iliac angiogram performed through the 5 French catheter. Again, no additional right hemipelvis bleeding site.  Access was removed. Hemostasis obtained with Exoseal device at the left common femoral artery access site. Patient tolerated the procedure well. No immediate complication.  IMPRESSION: Right hemipelvis active arterial bleeding from a small peripheral branch off of the right obturator artery at the level of the right superior ramus fracture. This was successfully embolized with Gelfoam and four 3 mm micro coils peripherally within the right obturator artery.  No additional right hemipelvis active bleeding site demonstrated post embolization.  Findings discussed with Dr. Grandville Silos following the procedure.   Electronically Signed   By: Daryll Brod M.D.   On: 11/16/2013 09:30   Dg Chest Port 1 View  11/15/2013   CLINICAL DATA:  Pt trampled by cow, pain mostly in right hip area, low bp  EXAM: PORTABLE CHEST - 1 VIEW  COMPARISON:  CT ANGIO CHEST W/CM &/OR WO/CM dated 10/03/2012; DG CHEST 2 VIEW dated 07/07/2007  FINDINGS: Low lung volumes. Cardiac silhouette enlarged. Aorta tortuous and ectatic. Lungs are clear. Degenerative changes right and left shoulders osseous structures otherwise unremarkable. There is no evidence of a pneumothorax.  IMPRESSION: No acute cardiopulmonary disease.   Electronically Signed   By: Margaree Mackintosh M.D.   On: 11/15/2013 16:25   [place] Guide Roadmapping  11/16/2013   CLINICAL DATA:  Traumatic right pelvic iliac and rami fractures with a large pelvic retroperitoneal hematoma and  active bleeding by CT.  EXAM: ULTRASOUND GUIDANCE FOR VASCULAR ACCESS  PELVIC FLUSH AORTOGRAM  SELECTIVE RIGHT INTERNAL ILIAC, INFERIOR GLUTEAL, PUDENDAL, AND OBTURATOR ARTERIAL CATHETERIZATIONS AND ANGIOGRAMS  PERIPHERAL RIGHT OBTURATOR ARTERY PARTICLE AND COIL EMBOLIZATION FOR ACTIVE BLEEDING  Date:  4/3/20154/10/2013 11:28 PM  Radiologist:  Jerilynn Mages. Daryll Brod, MD  Guidance:  Ultrasound fluoroscopic  FLUOROSCOPY TIME:  9 min 6 seconds  [place]  HISTORY: 1 mg Versed, 100 mcg fentanyl  ANESTHESIA/SEDATION: 55 min  CONTRAST:  121mL OMNIPAQUE IOHEXOL 300 MG/ML  SOLN  COMPLICATIONS: No immediate  PROCEDURE: Informed consent was obtained from the patient following explanation of the procedure, risks, benefits and alternatives. The patient understands, agrees and consents for the procedure. All questions were addressed. A time out was performed.  Maximal barrier sterile technique utilized including caps, mask, sterile gowns, sterile gloves, large sterile drape, hand hygiene, and ChloraPrep.  Under sterile conditions and local anesthesia, ultrasound micropuncture access was performed of the left common femoral artery. Guidewire inserted followed by a 5 fr sheath. Pigtail catheter advanced to the aortic bifurcation. Flush pelvic angiogram performed.  Pelvic angiogram: Diffuse iliac calcific atherosclerosis. The distal aortic bifurcation and pelvic iliac vessels are widely patent. The common, internal, and external iliac arteries are patent. Visualized femoral vessels are patent in the inguinal regions. In the right hemipelvis, there is contrast extravasation compatible with active arterial bleeding from a small branch originating peripherally off of the internal iliac vasculature.  For further investigation, catheter was exchanged for a C2 catheter. This catheter was advanced over the bifurcation into the right internal iliac artery. Selective right internal iliac angiogram performed.  Right Internal iliac  artery: Right internal iliac artery is patent. Anterior and posterior divisions are patent. Active arterial bleeding demonstrated in the inferior right hemipelvis from a small peripheral branch either from the obturator or pudendal arteries.  From this location, a Renegade micro catheter was advanced through the C2 catheter over a micro glidewire. Initially the catheter was advanced into the right inferior gluteal artery. Selective angiogram of the inferior gluteal artery did not demonstrate any active bleeding.  Micro catheter was retracted and advanced into the internal pudendal artery. Selective angiogram demonstrates patency of the internal pudendal artery without active bleeding.  Catheter was retracted and advanced into the right obturator artery. Selective right obturator angiogram demonstrates active bleeding from a small branch off of the right obturator artery along the right superior ramus. This correlates with the active bleeding site by CT.  Embolization: From this peripheral location within the right obturator artery, initially Gel-Foam embolization was performed along the right superior ramus fracture. Additionally, 4 3 mm x 6cm Interlock micro coils were deployed peripherally in the right obturator artery across the small bleeding obturator branch.  Final post embolization angiogram demonstrates occlusion of the peripheral obturarot artery without any additional active arterial bleeding.  Micro catheter was retracted into the internal iliac artery. Additional internal iliac angiogram demonstrates no additional active bleeding site. Micro catheter was removed. Additional right internal iliac angiogram performed through the 5 French catheter. Again, no additional right hemipelvis bleeding site.  Access was removed. Hemostasis obtained with Exoseal device at the left common femoral artery access site. Patient tolerated the procedure well. No immediate complication.  IMPRESSION: Right hemipelvis active  arterial bleeding from a small peripheral branch off of the right obturator artery at the level of the right superior ramus fracture. This was successfully embolized with Gelfoam and four 3 mm micro coils peripherally within the right obturator artery.  No additional right hemipelvis active bleeding site demonstrated post embolization.  Findings discussed with Dr. Grandville Silos following the procedure.   Electronically Signed   By: Daryll Brod M.D.   On: 11/16/2013 09:30    Anti-infectives: Anti-infectives   None      Assessment/Plan: Kicked by cow R iliac and pubic rami fx with large hematoma - S/P angioempolization, WBAT per ortho,  PT/OT ABL anemia - TF 1u PRBC earlier today, H/H this PM FEN - advance diet, lasix X1  Resp failure - needing O2 replaced, lasix should help Thrombocytopenia - consumptive VTE - PAS until Hb stable Dispo - ICU today with sats 80s   LOS: 2 days    Georganna Skeans, MD, MPH, FACS Trauma: [phone] General Surgery: [phone]  11/17/2013

## 2013-11-17 NOTE — Progress Notes (Signed)
CRITICAL VALUE ALERT  Critical value received:  Hemoglobin=6.5  Date of notification:  11/17/13  Time of notification:  0328   Critical value read back: yes  Nurse who received alert:  Vita Barley, RN  MD notified (1st page):  Chrissie Noa, 03:35  Time of first page:  03:35  MD notified (2nd page):  Time of second page:  Responding MD:  Rolm Bookbinder, MD  Time MD responded:  03:35

## 2013-11-18 DIAGNOSIS — D62 Acute posthemorrhagic anemia: Secondary | ICD-10-CM | POA: Diagnosis not present

## 2013-11-18 DIAGNOSIS — S3991XA Unspecified injury of abdomen, initial encounter: Secondary | ICD-10-CM | POA: Diagnosis present

## 2013-11-18 DIAGNOSIS — D696 Thrombocytopenia, unspecified: Secondary | ICD-10-CM | POA: Diagnosis present

## 2013-11-18 DIAGNOSIS — I480 Paroxysmal atrial fibrillation: Secondary | ICD-10-CM | POA: Insufficient documentation

## 2013-11-18 DIAGNOSIS — R571 Hypovolemic shock: Secondary | ICD-10-CM | POA: Diagnosis present

## 2013-11-18 DIAGNOSIS — Z7901 Long term (current) use of anticoagulants: Secondary | ICD-10-CM

## 2013-11-18 DIAGNOSIS — S329XXA Fracture of unspecified parts of lumbosacral spine and pelvis, initial encounter for closed fracture: Secondary | ICD-10-CM | POA: Diagnosis not present

## 2013-11-18 LAB — BASIC METABOLIC PANEL
BUN: 26 mg/dL — AB (ref 6–23)
CALCIUM: 8 mg/dL — AB (ref 8.4–10.5)
CO2: 23 meq/L (ref 19–32)
CREATININE: 1.1 mg/dL (ref 0.50–1.35)
Chloride: 106 mEq/L (ref 96–112)
GFR calc Af Amer: 68 mL/min — ABNORMAL LOW (ref >90)
GFR calc non Af Amer: 58 mL/min — ABNORMAL LOW (ref >90)
GLUCOSE: 110 mg/dL — AB (ref 70–99)
Potassium: 4.1 mEq/L (ref 3.7–5.3)
SODIUM: 139 meq/L (ref 137–147)

## 2013-11-18 LAB — HEMOGLOBIN AND HEMATOCRIT, BLOOD
HCT: 23.6 % — ABNORMAL LOW (ref 39.0–52.0)
HEMOGLOBIN: 7.7 g/dL — AB (ref 13.0–17.0)

## 2013-11-18 LAB — CBC
HEMATOCRIT: 20.2 % — AB (ref 39.0–52.0)
HEMOGLOBIN: 6.5 g/dL — AB (ref 13.0–17.0)
MCH: 27.4 pg (ref 26.0–34.0)
MCHC: 32.2 g/dL (ref 30.0–36.0)
MCV: 85.2 fL (ref 78.0–100.0)
Platelets: 64 10*3/uL — ABNORMAL LOW (ref 150–400)
RBC: 2.37 MIL/uL — ABNORMAL LOW (ref 4.22–5.81)
RDW: 16.7 % — ABNORMAL HIGH (ref 11.5–15.5)
WBC: 3.7 10*3/uL — ABNORMAL LOW (ref 4.0–10.5)

## 2013-11-18 MED ORDER — FENTANYL CITRATE 0.05 MG/ML IJ SOLN
25.0000 ug | INTRAMUSCULAR | Status: DC | PRN
  Administered 2013-11-20: 25 ug via INTRAVENOUS
  Filled 2013-11-18: qty 2

## 2013-11-18 MED ORDER — ENSURE COMPLETE PO LIQD
237.0000 mL | Freq: Three times a day (TID) | ORAL | Status: DC
  Administered 2013-11-18 – 2013-11-21 (×7): 237 mL via ORAL

## 2013-11-18 MED ORDER — TRAMADOL HCL 50 MG PO TABS
50.0000 mg | ORAL_TABLET | Freq: Four times a day (QID) | ORAL | Status: DC | PRN
  Administered 2013-11-18 (×2): 50 mg via ORAL
  Filled 2013-11-18 (×2): qty 1

## 2013-11-18 MED ORDER — FUROSEMIDE 10 MG/ML IJ SOLN
20.0000 mg | Freq: Once | INTRAMUSCULAR | Status: AC
  Administered 2013-11-18: 20 mg via INTRAVENOUS
  Filled 2013-11-18: qty 2

## 2013-11-18 MED ORDER — NAPROXEN 500 MG PO TABS
500.0000 mg | ORAL_TABLET | Freq: Two times a day (BID) | ORAL | Status: DC
  Administered 2013-11-18 – 2013-11-21 (×6): 500 mg via ORAL
  Filled 2013-11-18 (×10): qty 1

## 2013-11-18 NOTE — Progress Notes (Signed)
Patient ID: Lee Allen, male   DOB: [birth year], 80 y.o.   MRN: [id number]   LOS: 3 days   Subjective: No c/o. Denies N/V. Pain controlled.   Objective: Vital signs in last 24 hours: Temp:  [98.3 F (36.8 C)-99.7 F (37.6 C)] 99.3 F (37.4 C) (04/06 0824) Pulse Rate:  [83-100] 85 (04/06 0900) Resp:  [18-23] 19 (04/06 0900) BP: (102-142)/(56-74) 102/64 mmHg (04/06 0900) SpO2:  [84 %-97 %] 93 % (04/06 0900)    Laboratory  CBC  Recent Labs  11/17/13 0235 11/17/13 1400 11/18/13 0324  WBC 5.1  --  3.7*  HGB 6.5* 7.3* 6.5*  HCT 19.7* 22.0* 20.2*  PLT 72*  --  64*   BMET  Recent Labs  11/16/13 0620 11/18/13 0324  NA 142 139  K 4.2 4.1  CL 110 106  CO2 19 23  GLUCOSE 165* 110*  BUN 25* 26*  CREATININE 1.06 1.10  CALCIUM 7.8* 8.0*    Physical Exam General appearance: alert and no distress Resp: clear to auscultation bilaterally Cardio: irregularly irregular rhythm GI: normal findings: bowel sounds normal and soft, non-tender   Assessment/Plan: Kicked by cow  R iliac and pubic rami fx with large hematoma - S/P angioempolization, WBAT per ortho, PT/OT  ABL anemia - TF 1u PRBC earlier today, H/H this PM  Thrombocytopenia - consumptive  FEN - D/C foley, encourage orals for pain VTE - SCD's  Dispo - Transfer to SDU    Lisette Abu, PA-C Pager: [phone] General Trauma PA Pager: [phone]  11/18/2013

## 2013-11-18 NOTE — Progress Notes (Addendum)
I saw the patient, participated in the history, exam and medical decision making, and concur with the physician assistant's note above.  Alert, nad cta mild wheezing; O2 sats 87% on 2L in chair Soft, nt, nd No edema  Pt/ot Agree with blood; give 1 dose lasix after transfusion pulm toilet - is, flutter valve See PA note  Leighton Ruff. Redmond Pulling, MD, FACS General, Bariatric, & Minimally Invasive Surgery [facility], [place]

## 2013-11-18 NOTE — Progress Notes (Signed)
Physical Therapy Evaluation Patient Details Name: Lee Allen MRN: [id number] DOB: 07/13/1925 Today's Date: 11/18/2013   History of Present Illness  Patient was stepped on by a cow. It stepped on his right thigh. He fell but had no LOC. C/O pain in his right thigh only. Patient with Rt pelvic fractures and abdominal/pelvic hematoma.  Patient with hypotension.  Clinical Impression  Patient presents with problems listed below.  Will benefit from acute PT to maximize independence prior to discharge home.  Will need to function at Mod I level to return home with wife.    Follow Up Recommendations Home health PT;Supervision - Intermittent    Equipment Recommendations  None recommended by PT    Recommendations for Other Services       Precautions / Restrictions Precautions Precautions: Fall Restrictions Weight Bearing Restrictions: No      Mobility  Bed Mobility                  Transfers Overall transfer level: Needs assistance Equipment used: Rolling walker (2 wheeled) Transfers: Sit to/from Stand Sit to Stand: Mod assist;+2 physical assistance         General transfer comment: Verbal cues for hand placement.  Assist to rise to standing and for balance.  Once upright, able to maintain balance with RW and min assist.  Ambulation/Gait Ambulation/Gait assistance: Min assist;+2 physical assistance Ambulation Distance (Feet): 4 Feet Assistive device: Rolling walker (2 wheeled) Gait Pattern/deviations: Step-to pattern;Decreased stance time - right;Decreased step length - left;Decreased weight shift to right;Antalgic;Trunk flexed Gait velocity: Decreased Gait velocity interpretation: Below normal speed for age/gender General Gait Details: Verbal cues for safe use of RW and gait sequence.  Assist initially to advance RLE.  Cues to use UE's to offload RLE.  Stairs            Wheelchair Mobility    Modified Rankin (Stroke Patients Only)       Balance                                             Pertinent Vitals/Pain Pain limiting mobility today. Attempted to remove O2 for ambulation.  Within 1 minute (while sitting in chair talking), patient's O2 sats dropped to 88% on room air.  Reapplied O2 prior to ambulation.  O2 sats back to 93%.    Home Living Family/patient expects to be discharged to:: Private residence Living Arrangements: Spouse/significant other (Patient is caregiver for wife) Available Help at Discharge: Family;Personal care attendant;Available PRN/intermittently (Aide 6 hours/day) Type of Home: House Home Access: Stairs to enter Entrance Stairs-Rails: None Entrance Stairs-Number of Steps: 2 Home Layout: One level Home Equipment: Cane - single point;Walker - 2 wheels;Bedside commode;Shower seat Additional Comments: Has aide 6 hours/day for wife currently.  Will still be available when patient returns home.    Prior Function Level of Independence: Independent         Comments: Patient has been caregiver for wife.  She is minimally ambulatory with RW.     Hand Dominance        Extremity/Trunk Assessment   Upper Extremity Assessment: Overall WFL for tasks assessed           Lower Extremity Assessment: RLE deficits/detail RLE Deficits / Details: Strength 4/5 limited by pain       Communication   Communication: No difficulties  Cognition Arousal/Alertness: Awake/alert Behavior During  Therapy: WFL for tasks assessed/performed Overall Cognitive Status: Within Functional Limits for tasks assessed (Some safety issues - asking if he can try to get up alone)                      General Comments      Exercises General Exercises - Lower Extremity Ankle Circles/Pumps: AROM;Both;10 reps;Seated      Assessment/Plan    PT Assessment Patient needs continued PT services  PT Diagnosis Difficulty walking;Acute pain   PT Problem List Decreased strength;Decreased activity  tolerance;Decreased balance;Decreased mobility;Decreased knowledge of use of DME;Decreased safety awareness;Cardiopulmonary status limiting activity;Pain  PT Treatment Interventions DME instruction;Gait training;Stair training;Functional mobility training;Patient/family education   PT Goals (Current goals can be found in the Care Plan section) Acute Rehab PT Goals Patient Stated Goal: To be able to go home PT Goal Formulation: With patient Time For Goal Achievement: 11/25/13 Potential to Achieve Goals: Good    Frequency Min 4X/week   Barriers to discharge Decreased caregiver support Wife unable to assist patient.  Has aide 6 hours/day.    Co-evaluation               End of Session Equipment Utilized During Treatment: Gait belt;Oxygen Activity Tolerance: Patient limited by pain;Patient limited by fatigue Patient left: in chair;with call bell/phone within reach Nurse Communication: Mobility status         Time: 1131-1145 PT Time Calculation (min): 14 min   Charges:   PT Evaluation $Initial PT Evaluation Tier I: 1 Procedure PT Treatments $Gait Training: 8-22 mins   PT G Codes:          Despina Pole 11/18/2013, 4:11 PM Carita Pian. Sanjuana Kava, [place] Pager [phone]

## 2013-11-18 NOTE — ED Provider Notes (Signed)
I saw and evaluated the patient, reviewed the resident's note and I agree with the findings and plan.   .Face to face Exam:  General:  Awake HEENT:  Atraumatic Resp:  Normal effort Abd:  Nondistended Neuro:No focal weakness  CRITICAL CARE Performed by: Leonard Schwartz L Total critical care time: 30 min Critical care time was exclusive of separately billable procedures and treating other patients. Critical care was necessary to treat or prevent imminent or life-threatening deterioration. Critical care was time spent personally by me on the following activities: development of treatment plan with patient and/or surrogate as well as nursing, discussions with consultants, evaluation of patient's response to treatment, examination of patient, obtaining history from patient or surrogate, ordering and performing treatments and interventions, ordering and review of laboratory studies, ordering and review of radiographic studies, pulse oximetry and re-evaluation of patient's condition.   Dot Lanes, MD 11/18/13 [phone]

## 2013-11-18 NOTE — Progress Notes (Signed)
CRITICAL VALUE ALERT  Critical value received:  hgb 6.5  Date of notification:  11/18/13  Time of notification:  0430  Critical value read back:yes  Nurse who received alert:  Aleen Campi    MD notified (1st page):  Dr. Grandville Silos   Time of first page:  0430  Responding MD:  Dr. Grandville Silos  Time MD responded:  [phone]

## 2013-11-18 NOTE — Progress Notes (Signed)
INITIAL NUTRITION ASSESSMENT  DOCUMENTATION CODES Per approved criteria  -Not Applicable   INTERVENTION: - Ensure Complete TID - Encouraged high calorie/protein intake to promote healing from trauma - RD to continue to monitor intake   NUTRITION DIAGNOSIS: Increased nutrient needs related to right iliac and pubic rami fracture as evidenced by MD notes.   Goal: Pt to consume >90% of meals/supplements  Monitor:  Weights, labs, intake  Reason for Assessment: Poor intake, trauma  80 y.o. male  Admitting Dx: Level 1 trauma from injury with cow, pelvic fractures with hypotension  ASSESSMENT: Was stepped on by a cow which knocked him down and trampled him. Found to have superior pubic ramus fracture on the right, left iliac wing fracture, large pelvic hematoma with active extravasation in the ED. Hx of hepatitis C, liver cirrhosis, atrial flutter on Coumadin.   Met with pt who reports eating a scant amount of lunch today. Reports at home he eats a good breakfast and dinner but only a small amount for lunch. Sample dinner at home would be beans, meat, and potatoes. Not on any nutritional supplements at home. Agreeable to trying Ensure Complete tomorrow, wanted to rest today. Weight relatively stable PTA.   Lipase, AST/ALT elevated   Nutrition Focused Physical Exam:  Subcutaneous Fat:  Orbital Region: WNL Upper Arm Region: WNL Thoracic and Lumbar Region: WNL  Muscle:  Temple Region: WNL Clavicle Bone Region: WNL Clavicle and Acromion Bone Region: WNL Scapular Bone Region: NA Dorsal Hand: WNL Patellar Region: NA Anterior Thigh Region: NA Posterior Calf Region: NA  Edema: Non-pitting LLE edema    Height: Ht Readings from Last 1 Encounters:  11/15/13 _0  (1.727 m)    Weight: Wt Readings from Last 1 Encounters:  11/15/13 179 lb (81.194 kg)    Ideal Body Weight: 154 lbs  % Ideal Body Weight: 116%  Wt Readings from Last 10 Encounters:  11/15/13 179 lb (81.194  kg)  09/02/13 178 lb (80.74 kg)  03/29/13 181 lb 1.9 oz (82.155 kg)  03/06/13 180 lb 6 oz (81.818 kg)  11/23/12 183 lb 12.8 oz (83.371 kg)  10/17/12 181 lb (82.101 kg)  10/05/12 179 lb 1.6 oz (81.239 kg)  10/05/12 179 lb 1.6 oz (81.239 kg)  12/23/10 178 lb 6.4 oz (80.922 kg)    Usual Body Weight: 178 lbs  % Usual Body Weight: 101%  BMI:  Body mass index is 27.22 kg/(m^2).  Estimated Nutritional Needs: Kcal: 1850-2150 Protein: 105-125g Fluid: 1.8-2.1L/day  Skin: Non-pitting LLE edema  Diet Order: General  EDUCATION NEEDS: -No education needs identified at this time   Intake/Output Summary (Last 24 hours) at 11/18/13 1520 Last data filed at 11/18/13 1400  Gross per 24 hour  Intake  962.5 ml  Output   3105 ml  Net -2142.5 ml    Last BM: 4/6  Labs:   Recent Labs Lab 11/15/13 1455 11/15/13 1608 11/16/13 0620 11/18/13 0324  NA 143 147 142 139  K 3.9 3.8 4.2 4.1  CL 107 111 110 106  CO2 21  --  19 23  BUN 27* 23 25* 26*  CREATININE 1.20 1.20 1.06 1.10  CALCIUM 9.3  --  7.8* 8.0*  GLUCOSE 200* 143* 165* 110*    CBG (last 3)  No results found for this basename: GLUCAP,  in the last 72 hours  Scheduled Meds: . naproxen  500 mg Oral BID WC  . sertraline  50 mg Oral Daily    Continuous Infusions:   Past  Medical History  Diagnosis Date  . Hepatitis C   . Liver cirrhosis   . Shingles   . Nephrolithiasis   . Skin cancer   . Atrial flutter     09/2012 s/p TEE/DCCV  . GERD (gastroesophageal reflux disease)   . [place] (hepatocellular carcinoma)     s/p ablation, with extension to Abdominal/chest wall  . History of tobacco abuse     quit 1972  . CAD (coronary artery disease)     coronary calcification by chest CT 09/2012  . Ascending aorta dilation     4 cm by chest CT 2/14    Past Surgical History  Procedure Laterality Date  . Lithotripsy    . Skin cancer excision    . Ablation  2012    liver  . Tee without cardioversion N/A 10/04/2012     Procedure: TRANSESOPHAGEAL ECHOCARDIOGRAM (TEE);  Surgeon: Peter M Martinique, MD;  Location: [facility] ENDOSCOPY;  Service: Cardiovascular;  Laterality: N/A;  . Cardioversion N/A 10/04/2012    Procedure: CARDIOVERSION;  Surgeon: Peter M Martinique, MD;  Location: [place];  Service: Cardiovascular;  Laterality: N/A;    Lee Allen, [place], [place] Pager [phone] After Hours Pager

## 2013-11-19 DIAGNOSIS — D62 Acute posthemorrhagic anemia: Secondary | ICD-10-CM | POA: Diagnosis not present

## 2013-11-19 DIAGNOSIS — S329XXA Fracture of unspecified parts of lumbosacral spine and pelvis, initial encounter for closed fracture: Secondary | ICD-10-CM | POA: Diagnosis not present

## 2013-11-19 DIAGNOSIS — T794XXA Traumatic shock, initial encounter: Secondary | ICD-10-CM | POA: Diagnosis not present

## 2013-11-19 DIAGNOSIS — D696 Thrombocytopenia, unspecified: Secondary | ICD-10-CM | POA: Diagnosis not present

## 2013-11-19 LAB — TYPE AND SCREEN
ABO/RH(D): B POS
Antibody Screen: NEGATIVE
UNIT DIVISION: 0
UNIT DIVISION: 0
UNIT DIVISION: 0
UNIT DIVISION: 0
UNIT DIVISION: 0
Unit division: 0
Unit division: 0
Unit division: 0

## 2013-11-19 LAB — CBC
HCT: 21.7 % — ABNORMAL LOW (ref 39.0–52.0)
HEMATOCRIT: 23.8 % — AB (ref 39.0–52.0)
HEMATOCRIT: 22.9 % — AB (ref 39.0–52.0)
HEMOGLOBIN: 7.1 g/dL — AB (ref 13.0–17.0)
HEMOGLOBIN: 7.5 g/dL — AB (ref 13.0–17.0)
Hemoglobin: 7.8 g/dL — ABNORMAL LOW (ref 13.0–17.0)
MCH: 28.1 pg (ref 26.0–34.0)
MCH: 28.2 pg (ref 26.0–34.0)
MCH: 28.2 pg (ref 26.0–34.0)
MCHC: 32.7 g/dL (ref 30.0–36.0)
MCHC: 32.8 g/dL (ref 30.0–36.0)
MCHC: 32.8 g/dL (ref 30.0–36.0)
MCV: 85.8 fL (ref 78.0–100.0)
MCV: 85.9 fL (ref 78.0–100.0)
MCV: 86.1 fL (ref 78.0–100.0)
Platelets: 61 10*3/uL — ABNORMAL LOW (ref 150–400)
Platelets: 68 10*3/uL — ABNORMAL LOW (ref 150–400)
Platelets: 66 10*3/uL — ABNORMAL LOW (ref 150–400)
RBC: 2.53 MIL/uL — AB (ref 4.22–5.81)
RBC: 2.77 MIL/uL — ABNORMAL LOW (ref 4.22–5.81)
RBC: 2.66 MIL/uL — ABNORMAL LOW (ref 4.22–5.81)
RDW: 16.7 % — ABNORMAL HIGH (ref 11.5–15.5)
RDW: 16.6 % — ABNORMAL HIGH (ref 11.5–15.5)
RDW: 16.9 % — ABNORMAL HIGH (ref 11.5–15.5)
WBC: 3.2 10*3/uL — ABNORMAL LOW (ref 4.0–10.5)
WBC: 3.4 10*3/uL — AB (ref 4.0–10.5)
WBC: 3.3 10*3/uL — ABNORMAL LOW (ref 4.0–10.5)

## 2013-11-19 MED ORDER — FUROSEMIDE 10 MG/ML IJ SOLN
20.0000 mg | Freq: Once | INTRAMUSCULAR | Status: AC
  Administered 2013-11-19: 20 mg via INTRAVENOUS
  Filled 2013-11-19: qty 2

## 2013-11-19 NOTE — Clinical Social Work Note (Signed)
Clinical Social Work Department BRIEF PSYCHOSOCIAL ASSESSMENT 11/19/2013  Patient:  Lee Allen, Lee Allen     Account Number:  [id number]     Admit date:  11/15/2013  Clinical Social Worker:  Myles Lipps  Date/Time:  11/19/2013 11:30 AM  Referred by:  RN  Date Referred:  11/19/2013 Referred for  Psychosocial assessment   Other Referral:   Interview type:  Patient Other interview type:   No family/friends at bedside    PSYCHOSOCIAL DATA Living Status:  WIFE Admitted from facility:   Level of care:   Primary support name:  Crumby,Patsy  [phone] / Alesia Banda  [phone] Primary support relationship to patient:  FAMILY Degree of support available:   Patient spouse has a caretaker at home due to her own limitations and patient daughter is a Automotive engineer with [employer] Current Concerns  None Noted   Other Concerns:    SOCIAL WORK ASSESSMENT / PLAN Clinical Social Worker met with patient at bedside to offer support and discuss patient needs at discharge.  Patient states that he was at the market trying to sell his male cow when someone left the gate open and she got out trampling him on her way.  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  Patient told his RN that he will work forever.  Patient lives at home with his wife who has a caretaker that assists her and is now available to assist both of them as needed.  Patient son in law is going to care for patient land while patient is recovering.  Patient plans to return home with support from his family at discharge.    Clinical Social Worker inquired about current substance use.  Patient states that he has concerns with current drug and alcohol use.  SBIRT complete and no resources given at this time.  Patient has made arrangements for transportation at time of discharge.  CSW signing off - no further social work  needs identified at this time.  Please reconsult if new needs arise.   Assessment/plan status:  No Further Intervention Required Other assessment/ plan:   Information/referral to community resources:   Holiday representative offered patient resources for return home, however patient states that he is already involved with a company that provides their caretaker and has his daughter and son in law to care for his farms.    PATIENT'S/FAMILY'S RESPONSE TO PLAN OF CARE: Patient alert and oriented x3 sitting up in the chair. Patient was engaged in conversation but was anxious to transfer to the floor.  Patient states that he would like to get home asap in order to assist as he can in the antique shop to prepare for the opening.  Patient is very motivated and willing to participate with therapies to expedite discharge.  Patient understanding of social work role and verbalized his appreciation for CSW support and concern.

## 2013-11-19 NOTE — Progress Notes (Signed)
Patient ID: Lee Allen, male   DOB: Feb 12, 1925, 80 y.o.   MRN: [id number]    Subjective: Better, had BM, passing urine  Objective: Vital signs in last 24 hours: Temp:  [98 F (36.7 C)-99 F (37.2 C)] 98 F (36.7 C) (04/07 0333) Pulse Rate:  [76-94] 77 (04/07 0800) Resp:  [17-26] 17 (04/07 0800) BP: (101-130)/(56-67) 104/67 mmHg (04/07 0800) SpO2:  [87 %-95 %] 90 % (04/07 0800)    Intake/Output from previous day: 04/06 0701 - 04/07 0700 In: 330 [P.O.:240; I.V.:40; Blood:50] Out: 4944 [Urine:1825] Intake/Output this shift:    General appearance: alert and cooperative Resp: clear to auscultation bilaterally Cardio: irregularly irregular rhythm GI: soft, evolving lateral pelvic hematoma, +BS Extremities: hematoma and abrasion R thigh, mild ecchymosis L ankle NT Neuro: alert, F/C  Lab Results: CBC   Recent Labs  11/18/13 0324 11/18/13 1100 11/19/13 0330  WBC 3.7*  --  3.2*  HGB 6.5* 7.7* 7.1*  HCT 20.2* 23.6* 21.7*  PLT 64*  --  61*   BMET  Recent Labs  11/18/13 0324  NA 139  K 4.1  CL 106  CO2 23  GLUCOSE 110*  BUN 26*  CREATININE 1.10  CALCIUM 8.0*   PT/INR  Recent Labs  11/16/13 1030 11/17/13 0235  LABPROT 14.5 14.6  INR 1.15 1.16   ABG No results found for this basename: PHART, PCO2, PO2, HCO3,  in the last 72 hours  Studies/Results: No results found.  Anti-infectives: Anti-infectives   None      Assessment/Plan: Kicked by cow  R iliac and pubic rami fx with large hematoma - S/P angioempolization, WBAT per ortho, PT/OT  ABL anemia - Hb 7.1, F/U AM  Thrombocytopenia - consumptive  FEN - passing urine OK, Lasix today again VTE - SCD's  Dispo - Transfer to floor, doing well with therapies, anticipate home with [facility] next day or two   LOS: 4 days    Georganna Skeans, MD, MPH, FACS Trauma: [phone] General Surgery: [phone]  11/19/2013

## 2013-11-19 NOTE — Evaluation (Signed)
Occupational Therapy Evaluation Patient Details Name: Lee Allen MRN: [id number] DOB: [birth year] Today's Date: 11/19/2013    History of Present Illness Patient was stepped on by a cow. It stepped on his right thigh. He fell but had no LOC. C/O pain in his right thigh only. Patient with Rt pelvic fractures and abdominal/pelvic hematoma.  Patient with hypotension.   Clinical Impression   Patient was independent with ADL/IADL prior to this accident.  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  Patient was providing some care to wife with arthritis and alzheimer's.  Patient extremely motivated to complete ADL's without adaptive equipment, but is somewhat limited by pain.  Patient will benefit from skilled OT Intervention to increase independence with basic ADL prior to discharge home, recommend [facility] OT.    Follow Up Recommendations  Home health OT    Equipment Recommendations  None recommended by OT (patient has BSC and tub seat.  Daughter works for home health agency)    Recommendations for Other Services       Precautions / Restrictions Precautions Precautions: Fall Restrictions Weight Bearing Restrictions: No      Mobility Bed Mobility Overal bed mobility: Needs Assistance Bed Mobility: Supine to Sit     Supine to sit: Min assist     General bed mobility comments: assist for R LE management and increased time  Transfers Overall transfer level: Needs assistance Equipment used: Rolling walker (2 wheeled) Transfers: Sit to/from [facility] Sit to Stand: Mod assist Stand pivot transfers: Mod assist       General transfer comment: Takes weight through UE's to reduce pressure through right leg    Balance                                            ADL Overall ADL's : Needs assistance/impaired Eating/Feeding: Independent   Grooming: Set up   Upper Body Bathing: Set up   Lower Body Bathing: Moderate  assistance;Sit to/from stand   Upper Body Dressing : Set up   Lower Body Dressing: Moderate assistance;Sit to/from stand   Toilet Transfer: Moderate assistance;BSC;RW       Tub/ Shower Transfer: Gaffer;Moderate assistance;Shower seat;Rolling walker   Functional mobility during ADLs: Moderate assistance;Rolling walker       Vision                     Perception Perception Perception Tested?: No   Praxis Praxis Praxis tested?: Within functional limits    Pertinent Vitals/Pain VSS, Pain 7/10 right pelvis with activity / movement     Hand Dominance Right   Extremity/Trunk Assessment Upper Extremity Assessment Upper Extremity Assessment: Overall WFL for tasks assessed   Lower Extremity Assessment Lower Extremity Assessment: Defer to PT evaluation   Cervical / Trunk Assessment Cervical / Trunk Assessment: Normal   Communication Communication Communication: No difficulties   Cognition Arousal/Alertness: Awake/alert Behavior During Therapy: WFL for tasks assessed/performed Overall Cognitive Status: Within Functional Limits for tasks assessed                     General Comments       Exercises       Shoulder Instructions      Home Living Family/patient expects to be discharged to:: Private residence Living Arrangements: Spouse/significant other Available Help at Discharge: Family;Personal care attendant;Available PRN/intermittently Type  of Home: House Home Access: Stairs to enter [employer] of Steps: 2 Entrance Stairs-Rails: None Home Layout: One level (step down into bedroom)     Bathroom Shower/Tub: Occupational psychologist: Standard Bathroom Accessibility: Yes   Home Equipment: Cane - single point;Walker - 2 wheels;Bedside commode;Shower seat   Additional Comments: Has aide 6 hours/day for wife currently.  Will still be available when patient returns home.      Prior Functioning/Environment Level of  Independence: Independent        Comments: Patient has been caregiver for wife.  She is minimally ambulatory with RW.    OT Diagnosis: Generalized weakness;Acute pain   OT Problem List: Decreased activity tolerance;Pain;Increased edema;Decreased knowledge of use of DME or AE   OT Treatment/Interventions: Self-care/ADL training;DME and/or AE instruction;Therapeutic activities;Therapeutic exercise;Patient/family education    OT Goals(Current goals can be found in the care plan section) Acute Rehab OT Goals Patient Stated Goal: "I want to be able to go home tomorrow." OT Goal Formulation: With patient Time For Goal Achievement: 11/26/13 Potential to Achieve Goals: Good  OT Frequency: Min 3X/week   Barriers to D/C:            Co-evaluation              End of Session Equipment Utilized During Treatment: Rolling walker  Activity Tolerance: Patient tolerated treatment well Patient left: in bed;with call bell/phone within reach   Time: 1450-1515 OT Time Calculation (min): 25 min Charges:  OT General Charges $OT Visit: 1 Procedure OT Evaluation $Initial OT Evaluation Tier I: 1 Procedure OT Treatments $Self Care/Home Management : 8-22 mins G-Codes:    Mariah Milling 12/04/13, 3:23 PM

## 2013-11-19 NOTE — Progress Notes (Signed)
Physical Therapy Treatment Patient Details Name: Lee Allen MRN: [id number] DOB: Jul 06, 1925 Today's Date: 11/19/2013    History of Present Illness Patient was stepped on by a cow. It stepped on his right thigh. He fell but had no LOC. C/O pain in his right thigh only. Patient with Rt pelvic fractures and abdominal/pelvic hematoma.  Patient with hypotension.    PT Comments    Pt with decreased R LE pain this date allowing for increased ambulation tolerance. Anticipate pt to con't to progress and achieve supervision level of function for safe transition home with 24/7 supervision/assist from family/caretakers.   Follow Up Recommendations  Home health PT;Supervision - Intermittent     Equipment Recommendations  Rolling walker with 5" wheels (pt reports having one at home)    Recommendations for Other Services       Precautions / Restrictions Precautions Precautions: Fall Restrictions Weight Bearing Restrictions: No    Mobility  Bed Mobility Overal bed mobility: Needs Assistance Bed Mobility: Supine to Sit     Supine to sit: Mod assist     General bed mobility comments: assist for R LE management and trunk elevation  Transfers Overall transfer level: Needs assistance Equipment used: Rolling walker (2 wheeled) Transfers: Sit to/from Stand Sit to Stand: Mod assist         General transfer comment: v/c's for hand placement and walker management, increased time  Ambulation/Gait Ambulation/Gait assistance: Min assist;+2 safety/equipment (2nd person for lines and chair follow) Ambulation Distance (Feet): 25 Feet Assistive device: Rolling walker (2 wheeled) Gait Pattern/deviations: Step-to pattern;Decreased stance time - right Gait velocity: slow   General Gait Details: incrased bilat UE WBing to off set R LE WBIng due to onset of pain with increased WBing on R LE   Stairs            Wheelchair Mobility    Modified Rankin (Stroke Patients Only)        Balance Overall balance assessment: Needs assistance (needs RW for safe standing)                                  Cognition Arousal/Alertness: Awake/alert Behavior During Therapy: WFL for tasks assessed/performed Overall Cognitive Status: Within Functional Limits for tasks assessed                      Exercises General Exercises - Lower Extremity Ankle Circles/Pumps: AROM;Both;Seated;10 reps Gluteal Sets: AROM;Both;10 reps;Seated Long Arc Quad: AROM;Both;10 reps;Seated Hip ABduction/ADduction: AROM;Both;10 reps;Seated Hip Flexion/Marching: AROM;10 reps;Seated;Right    General Comments General comments (skin integrity, edema, etc.): pt motivated      Pertinent Vitals/Pain Minimal R LE pain     Home Living                      Prior Function            PT Goals (current goals can now be found in the care plan section) Acute Rehab PT Goals Patient Stated Goal: "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"' Progress towards PT goals: Progressing toward goals    Frequency  Min 4X/week    PT Plan Current plan remains appropriate    Co-evaluation             End of Session Equipment Utilized During Treatment: Gait belt;Oxygen Activity Tolerance: Patient limited by pain;Patient limited by  fatigue Patient left: in chair;with call bell/phone within reach     Time: 0909-0934 PT Time Calculation (min): 25 min  Charges:  $Gait Training: 8-22 mins $Therapeutic Exercise: 8-22 mins                    G Codes:      Lee Allen 11/19/2013, 10:00 AM  Kittie Plater, PT, DPT Pager #: [phone] Office #: [phone]

## 2013-11-20 DIAGNOSIS — S329XXA Fracture of unspecified parts of lumbosacral spine and pelvis, initial encounter for closed fracture: Secondary | ICD-10-CM | POA: Diagnosis not present

## 2013-11-20 DIAGNOSIS — D62 Acute posthemorrhagic anemia: Secondary | ICD-10-CM | POA: Diagnosis not present

## 2013-11-20 NOTE — Progress Notes (Signed)
Patient ID: Lee Allen, male   DOB: 08-16-1924, 80 y.o.   MRN: [id number]   LOS: 5 days   Subjective: Feels a little better today but questions whether he's ready to go home.   Objective: Vital signs in last 24 hours: Temp:  [98 F (36.7 C)-98.5 F (36.9 C)] 98 F (36.7 C) (04/08 0557) Pulse Rate:  [79-84] 84 (04/08 0557) Resp:  [17-20] 20 (04/08 0557) BP: (118-144)/(51-83) 144/83 mmHg (04/08 0557) SpO2:  [95 %-99 %] 99 % (04/08 0557) Last BM Date: 11/18/13   Physical Exam General appearance: alert and no distress Resp: clear to auscultation bilaterally Cardio: irregularly irregular rhythm GI: normal findings: bowel sounds normal and soft, non-tender   Assessment/Plan: Kicked by cow  R iliac and pubic rami fx with large hematoma - S/P angioempolization, WBAT per ortho, PT/OT  ABL anemia - Stable Thrombocytopenia - Stable Afib -- Will leave to PCP to restart coumadin if desired FEN - No issues VTE - SCD's  Dispo - Home w/HH once PT/OT clear, possibly today    Lisette Abu, PA-C Pager: [phone] General Trauma PA Pager: [phone]  11/20/2013

## 2013-11-20 NOTE — Progress Notes (Signed)
Physical Therapy Treatment Patient Details Name: Lee Allen MRN: [id number] DOB: March 03, 1925 Today's Date: 11/20/2013    History of Present Illness Patient was stepped on by a cow. It stepped on his right thigh. He fell but had no LOC. C/O pain in his right thigh only. Patient with Rt pelvic fractures and abdominal/pelvic hematoma.  Patient with hypotension.    PT Comments    Patient eager for d/c home. Spoke with patient to ensure supervision at home for safety. Patient states daughter can provide safe care management for him. Patient able to perform bed mobility with less A. Patient able to tolerate therex in bed and in standing position with good form. Patient progressing towards goals but still requires A with gait training. After therapy patient requested to sit upright but no recliner was present in room. Provided patient with recliner chair. Notified nursing staff to promote upright sitting and change in position as tolerated.    Follow Up Recommendations  Home health PT;Supervision - Intermittent     Equipment Recommendations  Rolling walker with 5" wheels    Recommendations for Other Services       Precautions / Restrictions Precautions Precautions: Fall Restrictions Weight Bearing Restrictions: No    Mobility  Bed Mobility Overal bed mobility: Needs Assistance Bed Mobility: Supine to Sit     Supine to sit: Min assist     General bed mobility comments: assist for R LE management and increased time, Patient requires use of bilateral UE with rail to move [facility]  Transfers Overall transfer level: Needs assistance Equipment used: Rolling walker (2 wheeled) Transfers: Sit to/from [facility] Sit to Stand: Min assist Stand pivot transfers: Min assist       General transfer comment: Takes weight through UE's to reduce pressure through right leg. Patient required cues for proper weight distribution.  Ambulation/Gait Ambulation/Gait assistance: Min  assist Ambulation Distance (Feet): 10 Feet Assistive device: Rolling walker (2 wheeled) Gait Pattern/deviations: Step-to pattern;Decreased stance time - right Gait velocity: slow   General Gait Details: increased bilat UE WB to off set R LE WB. Patient instructed on WS to allow more weight through LE. Patient able to amb forward and backward in RW   Stairs            Wheelchair Mobility    Modified Rankin (Stroke Patients Only)       Balance Overall balance assessment: Needs assistance             Standing balance comment: Patient requires A for safety with standing balance                    Cognition Arousal/Alertness: Awake/alert Behavior During Therapy: WFL for tasks assessed/performed Overall Cognitive Status: Within Functional Limits for tasks assessed                      Exercises Total Joint Exercises Quad Sets: AROM;Both;10 reps Heel Slides: AROM;Right Long Arc Quad: AROM;Right;5 reps Marching in Standing: AROM;Seated;Both;5 reps    General Comments        Pertinent Vitals/Pain Patient denies pain at this time.    Home Living                      Prior Function            PT Goals (current goals can now be found in the care plan section) Progress towards PT goals: Progressing toward goals  Frequency  Min 4X/week    PT Plan Current plan remains appropriate    Co-evaluation             End of Session Equipment Utilized During Treatment: Gait belt Activity Tolerance: Patient tolerated treatment well;Patient limited by fatigue Patient left: in bed;with call bell/phone within reach;with bed alarm set     Time: 5625-6389 PT Time Calculation (min): 24 min  Charges:                       G Codes:      [place], SPTA 11/20/2013, 12:03 PM

## 2013-11-20 NOTE — Progress Notes (Signed)
Seen, agree with above. D/C with HH once clear.  Will defer restart coumadin to PCP.  Sounds like it was for atrial fibrillation.

## 2013-11-20 NOTE — Progress Notes (Signed)
Seen and agreed 11/20/2013 Tonia Brooms Khoen Genet PTA [phone] pager [phone] office
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
## 2013-11-20 NOTE — Progress Notes (Signed)
UR completed.  Suraiya Dickerson, RN BSN [facility] CCM Trauma/Neuro ICU Case Manager [phone]  

## 2013-11-21 DIAGNOSIS — S329XXA Fracture of unspecified parts of lumbosacral spine and pelvis, initial encounter for closed fracture: Secondary | ICD-10-CM | POA: Diagnosis not present

## 2013-11-21 DIAGNOSIS — D62 Acute posthemorrhagic anemia: Secondary | ICD-10-CM | POA: Diagnosis not present

## 2013-11-21 MED ORDER — TRAMADOL HCL 50 MG PO TABS: 50.0000 mg | ORAL_TABLET | Freq: Four times a day (QID) | ORAL | Status: DC | PRN
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
## 2013-11-21 NOTE — Progress Notes (Signed)
Pt planned for d/c home today.  Rolling walker is being delivered to pt's room within the next hour.  Pt chose [facility] for his HHPT/OT as his daughter is an employee with them. Address and phone number in system confirmed as correct and pt says that the home number is the best one to call.

## 2013-11-21 NOTE — Progress Notes (Signed)
Seen and agreed 11/21/2013 Tonia Brooms Robinette PTA [phone] pager [phone] office

## 2013-11-21 NOTE — Progress Notes (Signed)
  Subjective: Continues to feel better everyday.  Ready to go home  Objective: Vital signs in last 24 hours: Temp:  [97.7 F (36.5 C)-98.8 F (37.1 C)] 97.7 F (36.5 C) (04/09 0524) Pulse Rate:  [84-86] 86 (04/09 0524) Resp:  [17-18] 17 (04/09 0524) BP: (131-142)/(69-75) 142/71 mmHg (04/09 0524) SpO2:  [96 %-97 %] 96 % (04/09 0524) Last BM Date: 11/18/13  Intake/Output from previous day: 04/08 0701 - 04/09 0700 In: 240 [P.O.:240] Out: 1500 [Urine:1500] Intake/Output this shift:    PE: General- In NAD CV-irregular rate, irregular rhythm Lungs-clear Abdomen-soft, nontender Pelvis-bilateral ecchymosis  Lab Results:   Recent Labs  11/19/13 1015 11/19/13 1645  WBC 3.4* 3.3*  HGB 7.8* 7.5*  HCT 23.8* 22.9*  PLT 68* 66*   BMET No results found for this basename: NA, K, CL, CO2, GLUCOSE, BUN, CREATININE, CALCIUM,  in the last 72 hours PT/INR No results found for this basename: LABPROT, INR,  in the last 72 hours Comprehensive Metabolic Panel:    Component Value Date/Time   NA 139 11/18/2013 0324   NA 142 11/16/2013 0620   K 4.1 11/18/2013 0324   K 4.2 11/16/2013 0620   CL 106 11/18/2013 0324   CL 110 11/16/2013 0620   CO2 23 11/18/2013 0324   CO2 19 11/16/2013 0620   BUN 26* 11/18/2013 0324   BUN 25* 11/16/2013 0620   CREATININE 1.10 11/18/2013 0324   CREATININE 1.06 11/16/2013 0620   GLUCOSE 110* 11/18/2013 0324   GLUCOSE 165* 11/16/2013 0620   CALCIUM 8.0* 11/18/2013 0324   CALCIUM 7.8* 11/16/2013 0620   AST 83* 11/15/2013 1455   AST 132* 10/03/2012 1851   ALT 61* 11/15/2013 1455   ALT 129* 10/03/2012 1851   ALKPHOS 99 11/15/2013 1455   ALKPHOS 111 10/03/2012 1851   BILITOT 0.6 11/15/2013 1455   BILITOT 1.0 10/03/2012 1851   PROT 6.6 11/15/2013 1455   PROT 7.6 10/03/2012 1851   ALBUMIN 3.1* 11/15/2013 1455   ALBUMIN 3.4* 10/03/2012 1851     Studies/Results: No results found.  Anti-infectives: Anti-infectives   None      Assessment Active Problems:   Pelvic fractures s/p  angioembolization   Acute blood loss anemia   Thrombocytopenia   Anticoagulated on Coumadin for afib-restart per PCP  Overall, he has improved enough for discharge.  He has 2 daughters and a caretaker that can help him.       LOS: 6 days   Plan: Discharge home with home PT and OT.   Rhunette Croft Cynthia Stainback 11/21/2013

## 2013-11-21 NOTE — Discharge Summary (Signed)
Agree with summary. 
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
## 2013-11-22 DIAGNOSIS — IMO0001 Reserved for inherently not codable concepts without codable children: Secondary | ICD-10-CM | POA: Diagnosis not present

## 2013-11-22 DIAGNOSIS — I2510 Atherosclerotic heart disease of native coronary artery without angina pectoris: Secondary | ICD-10-CM | POA: Diagnosis not present

## 2013-11-22 DIAGNOSIS — K7460 Unspecified cirrhosis of liver: Secondary | ICD-10-CM | POA: Diagnosis not present

## 2013-11-22 DIAGNOSIS — Z5189 Encounter for other specified aftercare: Secondary | ICD-10-CM | POA: Diagnosis not present

## 2013-11-22 DIAGNOSIS — I4891 Unspecified atrial fibrillation: Secondary | ICD-10-CM | POA: Diagnosis not present

## 2013-11-22 DIAGNOSIS — B1920 Unspecified viral hepatitis C without hepatic coma: Secondary | ICD-10-CM | POA: Diagnosis not present

## 2013-11-25 DIAGNOSIS — I4891 Unspecified atrial fibrillation: Secondary | ICD-10-CM | POA: Diagnosis not present

## 2013-11-25 DIAGNOSIS — B1920 Unspecified viral hepatitis C without hepatic coma: Secondary | ICD-10-CM | POA: Diagnosis not present

## 2013-11-25 DIAGNOSIS — K7460 Unspecified cirrhosis of liver: Secondary | ICD-10-CM | POA: Diagnosis not present

## 2013-11-25 DIAGNOSIS — I2510 Atherosclerotic heart disease of native coronary artery without angina pectoris: Secondary | ICD-10-CM | POA: Diagnosis not present

## 2013-11-25 DIAGNOSIS — IMO0001 Reserved for inherently not codable concepts without codable children: Secondary | ICD-10-CM | POA: Diagnosis not present

## 2013-11-25 DIAGNOSIS — Z5189 Encounter for other specified aftercare: Secondary | ICD-10-CM | POA: Diagnosis not present

## 2013-11-26 ENCOUNTER — Telehealth: Payer: Self-pay | Admitting: Internal Medicine

## 2013-11-26 NOTE — Telephone Encounter (Signed)
Spoke with pt dtr, the pt has a follow up appt tomorrow with his PCP. They want to make sure dr Harrington Challenger is aware of what is going on and her option about the coumadin. Aware dr Harrington Challenger is not here today but will forward for her review. dtr agreed with this plan.

## 2013-11-26 NOTE — Telephone Encounter (Signed)
Spoke to patients wife. I would recomm that he stay off of coumadin WIll make sure patient has f/u in 8 to 12 wks
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
## 2013-11-27 DIAGNOSIS — K7460 Unspecified cirrhosis of liver: Secondary | ICD-10-CM | POA: Diagnosis not present

## 2013-11-27 DIAGNOSIS — N501 Vascular disorders of male genital organs: Secondary | ICD-10-CM | POA: Diagnosis not present

## 2013-11-27 DIAGNOSIS — IMO0001 Reserved for inherently not codable concepts without codable children: Secondary | ICD-10-CM | POA: Diagnosis not present

## 2013-11-27 DIAGNOSIS — I4891 Unspecified atrial fibrillation: Secondary | ICD-10-CM | POA: Diagnosis not present

## 2013-11-27 DIAGNOSIS — Z9181 History of falling: Secondary | ICD-10-CM | POA: Diagnosis not present

## 2013-11-27 DIAGNOSIS — R578 Other shock: Secondary | ICD-10-CM | POA: Diagnosis not present

## 2013-11-27 DIAGNOSIS — I4892 Unspecified atrial flutter: Secondary | ICD-10-CM | POA: Diagnosis not present

## 2013-11-27 DIAGNOSIS — Z1330 Encounter for screening examination for mental health and behavioral disorders, unspecified: Secondary | ICD-10-CM | POA: Diagnosis not present

## 2013-11-27 DIAGNOSIS — I2510 Atherosclerotic heart disease of native coronary artery without angina pectoris: Secondary | ICD-10-CM | POA: Diagnosis not present

## 2013-11-27 DIAGNOSIS — S329XXA Fracture of unspecified parts of lumbosacral spine and pelvis, initial encounter for closed fracture: Secondary | ICD-10-CM | POA: Diagnosis not present

## 2013-11-27 DIAGNOSIS — R300 Dysuria: Secondary | ICD-10-CM | POA: Diagnosis not present

## 2013-11-27 DIAGNOSIS — Z5189 Encounter for other specified aftercare: Secondary | ICD-10-CM | POA: Diagnosis not present

## 2013-11-27 DIAGNOSIS — Z1342 Encounter for screening for global developmental delays (milestones): Secondary | ICD-10-CM | POA: Diagnosis not present

## 2013-11-27 DIAGNOSIS — B1920 Unspecified viral hepatitis C without hepatic coma: Secondary | ICD-10-CM | POA: Diagnosis not present

## 2013-11-27 NOTE — Telephone Encounter (Signed)
Spoke with pt, Follow up scheduled  

## 2013-11-28 DIAGNOSIS — N390 Urinary tract infection, site not specified: Secondary | ICD-10-CM | POA: Diagnosis not present

## 2013-11-29 DIAGNOSIS — I4891 Unspecified atrial fibrillation: Secondary | ICD-10-CM | POA: Diagnosis not present

## 2013-11-29 DIAGNOSIS — B1920 Unspecified viral hepatitis C without hepatic coma: Secondary | ICD-10-CM | POA: Diagnosis not present

## 2013-11-29 DIAGNOSIS — I2510 Atherosclerotic heart disease of native coronary artery without angina pectoris: Secondary | ICD-10-CM | POA: Diagnosis not present

## 2013-11-29 DIAGNOSIS — Z5189 Encounter for other specified aftercare: Secondary | ICD-10-CM | POA: Diagnosis not present

## 2013-11-29 DIAGNOSIS — IMO0001 Reserved for inherently not codable concepts without codable children: Secondary | ICD-10-CM | POA: Diagnosis not present

## 2013-11-29 DIAGNOSIS — K7460 Unspecified cirrhosis of liver: Secondary | ICD-10-CM | POA: Diagnosis not present

## 2013-12-02 DIAGNOSIS — I4891 Unspecified atrial fibrillation: Secondary | ICD-10-CM | POA: Diagnosis not present

## 2013-12-02 DIAGNOSIS — I2510 Atherosclerotic heart disease of native coronary artery without angina pectoris: Secondary | ICD-10-CM | POA: Diagnosis not present

## 2013-12-02 DIAGNOSIS — Z5189 Encounter for other specified aftercare: Secondary | ICD-10-CM | POA: Diagnosis not present

## 2013-12-02 DIAGNOSIS — B1920 Unspecified viral hepatitis C without hepatic coma: Secondary | ICD-10-CM | POA: Diagnosis not present

## 2013-12-02 DIAGNOSIS — K7460 Unspecified cirrhosis of liver: Secondary | ICD-10-CM | POA: Diagnosis not present

## 2013-12-02 DIAGNOSIS — IMO0001 Reserved for inherently not codable concepts without codable children: Secondary | ICD-10-CM | POA: Diagnosis not present

## 2013-12-04 DIAGNOSIS — IMO0001 Reserved for inherently not codable concepts without codable children: Secondary | ICD-10-CM | POA: Diagnosis not present

## 2013-12-04 DIAGNOSIS — I2510 Atherosclerotic heart disease of native coronary artery without angina pectoris: Secondary | ICD-10-CM | POA: Diagnosis not present

## 2013-12-04 DIAGNOSIS — I4891 Unspecified atrial fibrillation: Secondary | ICD-10-CM | POA: Diagnosis not present

## 2013-12-04 DIAGNOSIS — B1920 Unspecified viral hepatitis C without hepatic coma: Secondary | ICD-10-CM | POA: Diagnosis not present

## 2013-12-04 DIAGNOSIS — K7460 Unspecified cirrhosis of liver: Secondary | ICD-10-CM | POA: Diagnosis not present

## 2013-12-04 DIAGNOSIS — Z5189 Encounter for other specified aftercare: Secondary | ICD-10-CM | POA: Diagnosis not present

## 2013-12-06 DIAGNOSIS — N390 Urinary tract infection, site not specified: Secondary | ICD-10-CM | POA: Diagnosis not present

## 2013-12-10 DIAGNOSIS — I4891 Unspecified atrial fibrillation: Secondary | ICD-10-CM | POA: Diagnosis not present

## 2013-12-10 DIAGNOSIS — I2510 Atherosclerotic heart disease of native coronary artery without angina pectoris: Secondary | ICD-10-CM | POA: Diagnosis not present

## 2013-12-10 DIAGNOSIS — B1920 Unspecified viral hepatitis C without hepatic coma: Secondary | ICD-10-CM | POA: Diagnosis not present

## 2013-12-10 DIAGNOSIS — K7460 Unspecified cirrhosis of liver: Secondary | ICD-10-CM | POA: Diagnosis not present

## 2013-12-10 DIAGNOSIS — Z5189 Encounter for other specified aftercare: Secondary | ICD-10-CM | POA: Diagnosis not present

## 2013-12-10 DIAGNOSIS — IMO0001 Reserved for inherently not codable concepts without codable children: Secondary | ICD-10-CM | POA: Diagnosis not present

## 2013-12-12 DIAGNOSIS — Z5189 Encounter for other specified aftercare: Secondary | ICD-10-CM | POA: Diagnosis not present

## 2013-12-12 DIAGNOSIS — K7460 Unspecified cirrhosis of liver: Secondary | ICD-10-CM | POA: Diagnosis not present

## 2013-12-12 DIAGNOSIS — I4891 Unspecified atrial fibrillation: Secondary | ICD-10-CM | POA: Diagnosis not present

## 2013-12-12 DIAGNOSIS — I2510 Atherosclerotic heart disease of native coronary artery without angina pectoris: Secondary | ICD-10-CM | POA: Diagnosis not present

## 2013-12-12 DIAGNOSIS — B1920 Unspecified viral hepatitis C without hepatic coma: Secondary | ICD-10-CM | POA: Diagnosis not present

## 2013-12-12 DIAGNOSIS — IMO0001 Reserved for inherently not codable concepts without codable children: Secondary | ICD-10-CM | POA: Diagnosis not present

## 2013-12-16 DIAGNOSIS — K7460 Unspecified cirrhosis of liver: Secondary | ICD-10-CM | POA: Diagnosis not present

## 2013-12-16 DIAGNOSIS — B1920 Unspecified viral hepatitis C without hepatic coma: Secondary | ICD-10-CM | POA: Diagnosis not present

## 2013-12-16 DIAGNOSIS — I4891 Unspecified atrial fibrillation: Secondary | ICD-10-CM | POA: Diagnosis not present

## 2013-12-16 DIAGNOSIS — I2510 Atherosclerotic heart disease of native coronary artery without angina pectoris: Secondary | ICD-10-CM | POA: Diagnosis not present

## 2013-12-16 DIAGNOSIS — IMO0001 Reserved for inherently not codable concepts without codable children: Secondary | ICD-10-CM | POA: Diagnosis not present

## 2013-12-16 DIAGNOSIS — Z5189 Encounter for other specified aftercare: Secondary | ICD-10-CM | POA: Diagnosis not present

## 2013-12-18 DIAGNOSIS — I4891 Unspecified atrial fibrillation: Secondary | ICD-10-CM | POA: Diagnosis not present

## 2013-12-18 DIAGNOSIS — IMO0001 Reserved for inherently not codable concepts without codable children: Secondary | ICD-10-CM | POA: Diagnosis not present

## 2013-12-18 DIAGNOSIS — I2510 Atherosclerotic heart disease of native coronary artery without angina pectoris: Secondary | ICD-10-CM | POA: Diagnosis not present

## 2013-12-18 DIAGNOSIS — Z5189 Encounter for other specified aftercare: Secondary | ICD-10-CM | POA: Diagnosis not present

## 2013-12-18 DIAGNOSIS — B1920 Unspecified viral hepatitis C without hepatic coma: Secondary | ICD-10-CM | POA: Diagnosis not present

## 2013-12-18 DIAGNOSIS — K7460 Unspecified cirrhosis of liver: Secondary | ICD-10-CM | POA: Diagnosis not present

## 2013-12-24 DIAGNOSIS — I2510 Atherosclerotic heart disease of native coronary artery without angina pectoris: Secondary | ICD-10-CM | POA: Diagnosis not present

## 2013-12-24 DIAGNOSIS — I4891 Unspecified atrial fibrillation: Secondary | ICD-10-CM | POA: Diagnosis not present

## 2013-12-24 DIAGNOSIS — K7460 Unspecified cirrhosis of liver: Secondary | ICD-10-CM | POA: Diagnosis not present

## 2013-12-24 DIAGNOSIS — Z5189 Encounter for other specified aftercare: Secondary | ICD-10-CM | POA: Diagnosis not present

## 2013-12-24 DIAGNOSIS — IMO0001 Reserved for inherently not codable concepts without codable children: Secondary | ICD-10-CM | POA: Diagnosis not present

## 2013-12-24 DIAGNOSIS — B1920 Unspecified viral hepatitis C without hepatic coma: Secondary | ICD-10-CM | POA: Diagnosis not present

## 2013-12-26 DIAGNOSIS — I2510 Atherosclerotic heart disease of native coronary artery without angina pectoris: Secondary | ICD-10-CM | POA: Diagnosis not present

## 2013-12-26 DIAGNOSIS — K7460 Unspecified cirrhosis of liver: Secondary | ICD-10-CM | POA: Diagnosis not present

## 2013-12-31 DIAGNOSIS — I2510 Atherosclerotic heart disease of native coronary artery without angina pectoris: Secondary | ICD-10-CM | POA: Diagnosis not present

## 2013-12-31 DIAGNOSIS — Z0000 Encounter for general adult medical examination without abnormal findings: Secondary | ICD-10-CM | POA: Diagnosis not present

## 2013-12-31 DIAGNOSIS — N390 Urinary tract infection, site not specified: Secondary | ICD-10-CM | POA: Diagnosis not present

## 2013-12-31 DIAGNOSIS — C228 Malignant neoplasm of liver, primary, unspecified as to type: Secondary | ICD-10-CM | POA: Diagnosis not present

## 2013-12-31 DIAGNOSIS — R509 Fever, unspecified: Secondary | ICD-10-CM | POA: Diagnosis not present

## 2014-01-02 DIAGNOSIS — K7460 Unspecified cirrhosis of liver: Secondary | ICD-10-CM | POA: Diagnosis not present

## 2014-01-02 DIAGNOSIS — I2510 Atherosclerotic heart disease of native coronary artery without angina pectoris: Secondary | ICD-10-CM | POA: Diagnosis not present

## 2014-01-02 DIAGNOSIS — IMO0001 Reserved for inherently not codable concepts without codable children: Secondary | ICD-10-CM | POA: Diagnosis not present

## 2014-01-02 DIAGNOSIS — I4891 Unspecified atrial fibrillation: Secondary | ICD-10-CM | POA: Diagnosis not present

## 2014-01-02 DIAGNOSIS — B1920 Unspecified viral hepatitis C without hepatic coma: Secondary | ICD-10-CM | POA: Diagnosis not present

## 2014-01-02 DIAGNOSIS — Z5189 Encounter for other specified aftercare: Secondary | ICD-10-CM | POA: Diagnosis not present

## 2014-01-10 DIAGNOSIS — IMO0001 Reserved for inherently not codable concepts without codable children: Secondary | ICD-10-CM | POA: Diagnosis not present

## 2014-01-10 DIAGNOSIS — K7460 Unspecified cirrhosis of liver: Secondary | ICD-10-CM | POA: Diagnosis not present

## 2014-01-10 DIAGNOSIS — I2510 Atherosclerotic heart disease of native coronary artery without angina pectoris: Secondary | ICD-10-CM | POA: Diagnosis not present

## 2014-01-10 DIAGNOSIS — Z5189 Encounter for other specified aftercare: Secondary | ICD-10-CM | POA: Diagnosis not present

## 2014-01-10 DIAGNOSIS — B1920 Unspecified viral hepatitis C without hepatic coma: Secondary | ICD-10-CM | POA: Diagnosis not present

## 2014-01-10 DIAGNOSIS — I4891 Unspecified atrial fibrillation: Secondary | ICD-10-CM | POA: Diagnosis not present

## 2014-01-20 ENCOUNTER — Ambulatory Visit (INDEPENDENT_AMBULATORY_CARE_PROVIDER_SITE_OTHER): Payer: Medicare Other | Admitting: Internal Medicine

## 2014-01-20 ENCOUNTER — Encounter: Payer: Self-pay | Admitting: Internal Medicine

## 2014-01-20 VITALS — BP 117/73 | HR 77 | Ht 68.0 in | Wt 165.0 lb

## 2014-01-20 DIAGNOSIS — R350 Frequency of micturition: Secondary | ICD-10-CM

## 2014-01-20 DIAGNOSIS — D649 Anemia, unspecified: Secondary | ICD-10-CM | POA: Diagnosis not present

## 2014-01-20 LAB — URINALYSIS, ROUTINE W REFLEX MICROSCOPIC
Bilirubin Urine: NEGATIVE
Hgb urine dipstick: NEGATIVE
Ketones, ur: NEGATIVE
NITRITE: NEGATIVE
PH: 6.5 (ref 5.0–8.0)
SPECIFIC GRAVITY, URINE: 1.02 (ref 1.000–1.030)
TOTAL PROTEIN, URINE-UPE24: NEGATIVE
Urine Glucose: NEGATIVE
Urobilinogen, UA: 0.2 (ref 0.0–1.0)

## 2014-01-20 LAB — CBC
HCT: 37.4 % — ABNORMAL LOW (ref 39.0–52.0)
HEMOGLOBIN: 12.5 g/dL — AB (ref 13.0–17.0)
MCHC: 33.5 g/dL (ref 30.0–36.0)
MCV: 95.1 fl (ref 78.0–100.0)
Platelets: 95 10*3/uL — ABNORMAL LOW (ref 150.0–400.0)
RBC: 3.93 Mil/uL — AB (ref 4.22–5.81)
RDW: 18.5 % — AB (ref 11.5–15.5)
WBC: 3.6 10*3/uL — AB (ref 4.0–10.5)

## 2014-01-20 LAB — BASIC METABOLIC PANEL
BUN: 11 mg/dL (ref 6–23)
CO2: 29 meq/L (ref 19–32)
Calcium: 9.5 mg/dL (ref 8.4–10.5)
Chloride: 105 mEq/L (ref 96–112)
Creatinine, Ser: 0.8 mg/dL (ref 0.4–1.5)
GFR: 101.48 mL/min (ref >60.00)
Glucose, Bld: 82 mg/dL (ref 70–99)
POTASSIUM: 3.6 meq/L (ref 3.5–5.1)
Sodium: 138 mEq/L (ref 135–145)

## 2014-01-20 LAB — TSH: TSH: 2.36 u[IU]/mL (ref 0.35–4.50)

## 2014-01-20 LAB — PSA: PSA: 0.85 ng/mL (ref 0.10–4.00)

## 2014-01-20 NOTE — Patient Instructions (Signed)
Your physician recommends that you return for lab work in: TODAY (CBC, BMET, TSH, UA, PSA)  Your physician recommends that you continue on your current medications as directed. Please refer to the Current Medication list given to you today. Your physician recommends that you schedule a follow-up appointment in: October 2015 Lee Allen.
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
## 2014-01-24 NOTE — Progress Notes (Signed)
Pt notified 6/12

## 2014-02-26 DIAGNOSIS — N390 Urinary tract infection, site not specified: Secondary | ICD-10-CM | POA: Diagnosis not present

## 2014-02-26 DIAGNOSIS — K7460 Unspecified cirrhosis of liver: Secondary | ICD-10-CM | POA: Diagnosis not present

## 2014-02-26 DIAGNOSIS — D696 Thrombocytopenia, unspecified: Secondary | ICD-10-CM | POA: Diagnosis not present

## 2014-02-26 DIAGNOSIS — R5381 Other malaise: Secondary | ICD-10-CM | POA: Diagnosis not present

## 2014-02-26 DIAGNOSIS — L299 Pruritus, unspecified: Secondary | ICD-10-CM | POA: Diagnosis not present

## 2014-02-26 DIAGNOSIS — S329XXA Fracture of unspecified parts of lumbosacral spine and pelvis, initial encounter for closed fracture: Secondary | ICD-10-CM | POA: Diagnosis not present

## 2014-02-26 DIAGNOSIS — R5383 Other fatigue: Secondary | ICD-10-CM | POA: Diagnosis not present

## 2014-02-26 DIAGNOSIS — I4892 Unspecified atrial flutter: Secondary | ICD-10-CM | POA: Diagnosis not present

## 2014-02-26 DIAGNOSIS — C228 Malignant neoplasm of liver, primary, unspecified as to type: Secondary | ICD-10-CM | POA: Diagnosis not present

## 2014-03-11 ENCOUNTER — Encounter: Payer: Self-pay | Admitting: Internal Medicine

## 2014-05-14 DIAGNOSIS — B1920 Unspecified viral hepatitis C without hepatic coma: Secondary | ICD-10-CM | POA: Diagnosis not present

## 2014-05-14 DIAGNOSIS — D696 Thrombocytopenia, unspecified: Secondary | ICD-10-CM | POA: Diagnosis not present

## 2014-05-14 DIAGNOSIS — N390 Urinary tract infection, site not specified: Secondary | ICD-10-CM | POA: Diagnosis not present

## 2014-06-02 ENCOUNTER — Other Ambulatory Visit (HOSPITAL_COMMUNITY): Payer: Self-pay | Admitting: Physician Assistant

## 2014-06-02 DIAGNOSIS — I77810 Thoracic aortic ectasia: Secondary | ICD-10-CM

## 2014-06-04 ENCOUNTER — Ambulatory Visit (HOSPITAL_COMMUNITY)
Admission: RE | Admit: 2014-06-04 | Discharge: 2014-06-04 | Disposition: A | Discharge status: Home / Self Care | Payer: Medicare Other | Source: Ambulatory Visit | Attending: Physician Assistant | Admitting: Physician Assistant

## 2014-06-04 DIAGNOSIS — I712 Thoracic aortic aneurysm, without rupture: Secondary | ICD-10-CM | POA: Diagnosis not present

## 2014-06-04 DIAGNOSIS — I77810 Thoracic aortic ectasia: Secondary | ICD-10-CM | POA: Diagnosis not present

## 2014-06-04 MED ORDER — IOHEXOL 350 MG/ML SOLN
100.0000 mL | Freq: Once | INTRAVENOUS | Status: AC | PRN
  Administered 2014-06-04: 100 mL via INTRAVENOUS

## 2014-06-18 ENCOUNTER — Institutional Professional Consult (permissible substitution) (INDEPENDENT_AMBULATORY_CARE_PROVIDER_SITE_OTHER): Payer: Medicare Other | Admitting: Surgery

## 2014-06-18 ENCOUNTER — Encounter: Payer: Self-pay | Admitting: Surgery

## 2014-06-18 VITALS — BP 118/68 | HR 110 | Resp 20 | Ht 68.0 in | Wt 165.0 lb

## 2014-06-18 DIAGNOSIS — I712 Thoracic aortic aneurysm, without rupture: Secondary | ICD-10-CM | POA: Diagnosis not present

## 2014-06-18 DIAGNOSIS — I7121 Aneurysm of the ascending aorta, without rupture: Secondary | ICD-10-CM

## 2014-06-19 ENCOUNTER — Encounter: Payer: Self-pay | Admitting: Surgery

## 2014-06-19 NOTE — Progress Notes (Signed)
Cardiothoracic Surgery Consultation   PCP is [name], MD Referring Provider is Dorathy Kinsman, MD  Chief Complaint  Patient presents with  . 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 In April 2015 he was stomped by a cow resulting in a pelvic fracture and large pelvic hematoma requiring embolization by IR and large volume transfusion. He made a good recovery and is back at home. He had a CTA of the chest on 10/03/2012 that showed dilation of the ascending aorta to 4 cm. He had a repeat CTA on 06/04/2014 to follow up on this and it showed the diameter was unchanged at 4 cm.  Past Medical History  Diagnosis Date  . Hepatitis C   . Liver cirrhosis   . Shingles   . Nephrolithiasis   . Skin cancer   . Atrial flutter     09/2012 s/p TEE/DCCV  . GERD (gastroesophageal reflux disease)   . [place] (hepatocellular carcinoma)     s/p ablation, with extension to Abdominal/chest wall  . History of tobacco abuse     quit 1972  . CAD (coronary artery disease)     coronary calcification by chest CT 09/2012  . Ascending aorta dilation     4 cm by chest CT 2/14    Past Surgical History  Procedure Laterality Date  . Lithotripsy    . Skin cancer excision    . Ablation  2012    liver  . Tee without cardioversion N/A 10/04/2012    Procedure: TRANSESOPHAGEAL ECHOCARDIOGRAM (TEE);  Surgeon: Peter M Martinique, MD;  Location: [facility] ENDOSCOPY;  Service: Cardiovascular;  Laterality: N/A;  . Cardioversion N/A 10/04/2012    Procedure: CARDIOVERSION;  Surgeon: Peter M Martinique, MD;  Location: [facility] ENDOSCOPY;  Service: Cardiovascular;  Laterality: N/A;    Family History  Problem  Relation Age of Onset  . Leukemia Father     Social History History  Substance Use Topics  . Smoking status: Former Smoker    Types: Cigarettes    Quit date: 08/15/1970  . Smokeless tobacco: Not on file  . Alcohol Use: 1.8 oz/week    3 Cans of beer per week     Comment: occasionally    Current Outpatient Prescriptions  Medication Sig Dispense Refill  . diphenhydrAMINE (BENADRYL) 25 MG tablet Take 25 mg by mouth every 6 (six) hours as needed.    . naproxen sodium (ANAPROX) 220 MG tablet Take 220 mg by mouth daily as needed (for pain).    Marland Kitchen sertraline (ZOLOFT) 50 MG tablet Take 50 mg by mouth daily.    . traMADol (ULTRAM) 50 MG tablet Take 1-2 tablets (50-100 mg total) by mouth every 6 (six) hours as needed (50mg  for mild pain, 75mg  for moderate pain, 100mg  for severe pain). 40 tablet 0   No current facility-administered medications for this visit.    Allergies  Allergen Reactions  . Penicillins Other (See Comments) and Dermatitis    Skin peeled from inside of mouth unknown    Review of Systems  Constitutional: Negative for fever, activity change, appetite change, fatigue and unexpected weight change.  HENT: Negative.   Eyes: Negative.   Respiratory: Negative.  Cardiovascular: Negative.  Negative for chest pain.  Gastrointestinal: Negative.   Endocrine: Negative.   Genitourinary: Negative.   Musculoskeletal: Negative.   Skin: Negative.   Allergic/Immunologic: Negative.   Neurological: Negative.   Hematological: Negative.   Psychiatric/Behavioral: Negative.     BP 118/68 mmHg  Pulse 110  Resp 20  Ht 5\' 8"  (1.727 m)  Wt 165 lb (74.844 kg)  BMI 25.09 kg/m2  SpO2 98% Physical Exam  Constitutional: He is oriented to person, place, and time. He appears well-developed and well-nourished. No distress.  HENT:  Head: Normocephalic and atraumatic.  Mouth/Throat: Oropharynx is clear and moist.  Eyes: EOM are normal. Pupils are equal, round, and reactive to light.    Neck: Normal range of motion. Neck supple. No JVD present. No thyromegaly present.  Cardiovascular: Normal heart sounds and intact distal pulses.   No murmur heard. Mild tachycardia and irregularity  Pulmonary/Chest: Effort normal and breath sounds normal. No respiratory distress. He has no rales. He exhibits no tenderness.  Abdominal: Soft. Bowel sounds are normal. He exhibits no distension and no mass. There is no tenderness.  Musculoskeletal: He exhibits no edema.  Lymphadenopathy:    He has no cervical adenopathy.  Neurological: He is alert and oriented to person, place, and time. No cranial nerve deficit.  Skin: Skin is warm and dry.  Psychiatric: He has a normal mood and affect.    Diagnostic Tests:  CLINICAL DATA: Dilated aortic root.  EXAM: CT ANGIOGRAPHY CHEST WITH CONTRAST  TECHNIQUE: Multidetector CT imaging of the chest was performed using the standard protocol during bolus administration of intravenous contrast. Multiplanar CT image reconstructions and MIPs were obtained to evaluate the vascular anatomy.  CONTRAST: 152mL OMNIPAQUE IOHEXOL 350 MG/ML SOLN  COMPARISON: Chest CTA on 10/03/2012 and abdominal CT on 11/15/2013 visualized upper abdomen shows stable appearance of a peripheral right lobe hepatic cyst and a bilobed cyst in the distal body of the pancreas.  FINDINGS: There is stable top-normal to minimally dilated caliber of the ascending thoracic aorta with maximal diameter of approximately 4.0- 4.1 cm. Dilatation does not involve the sinuses of Valsalva. The proximal aortic arch measures 3.8 cm. The distal aortic arch measures 3.3 cm. The descending thoracic aorta measures 3.3 cm. No evidence of aortic dissection, intramural hemorrhage or penetrating ulcer disease. Mild atherosclerotic plaque is present at the origins of the left subclavian, left common carotid and innominate arteries without significant obstructive disease  identified.  Stable evidence of coronary atherosclerosis with calcified plaque in the distribution of the LAD and also likely in the left circumflex territory distally. The heart size is stable and mildly enlarged. No pleural or pericardial fluid is seen.  No evidence of pulmonary edema, nodule or airspace consolidation. No enlarged lymph nodes are seen.  Review of the MIP images confirms the above findings.  IMPRESSION: Stable top-normal to minimally dilated ascending thoracic aorta measuring 4.0- 4.1 cm in maximal diameter.   Electronically Signed  By: Aletta Edouard M.D.  On: 06/04/2014 13:21   Impression:  He has stable mild dilation of the ascending aorta at 4.0 cm. This does not require any treatment and I don't think it requires any further follow up in an 80 year old gentleman. It has not changed from his scan in 09/2012. 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  Plan:  He will continue to follow up with his primary  physician.

## 2014-08-14 ENCOUNTER — Encounter: Payer: Self-pay | Admitting: Internal Medicine

## 2014-10-22 ENCOUNTER — Other Ambulatory Visit: Payer: Self-pay | Admitting: Surgery

## 2014-10-22 DIAGNOSIS — C220 Liver cell carcinoma: Secondary | ICD-10-CM

## 2014-11-03 ENCOUNTER — Ambulatory Visit
Admission: RE | Admit: 2014-11-03 | Discharge: 2014-11-03 | Disposition: A | Discharge status: Home / Self Care | Payer: Medicare Other | Source: Ambulatory Visit | Attending: Surgery | Admitting: Surgery

## 2014-11-03 DIAGNOSIS — R161 Splenomegaly, not elsewhere classified: Secondary | ICD-10-CM | POA: Diagnosis not present

## 2014-11-03 DIAGNOSIS — K862 Cyst of pancreas: Secondary | ICD-10-CM | POA: Diagnosis not present

## 2014-11-03 DIAGNOSIS — K769 Liver disease, unspecified: Secondary | ICD-10-CM | POA: Diagnosis not present

## 2014-11-03 DIAGNOSIS — C220 Liver cell carcinoma: Secondary | ICD-10-CM

## 2014-11-03 DIAGNOSIS — N281 Cyst of kidney, acquired: Secondary | ICD-10-CM | POA: Diagnosis not present

## 2014-11-03 MED ORDER — GADOXETATE DISODIUM 0.25 MMOL/ML IV SOLN
8.0000 mL | Freq: Once | INTRAVENOUS | Status: AC | PRN
  Administered 2014-11-03: 8 mL via INTRAVENOUS

## 2014-11-26 DIAGNOSIS — R5383 Other fatigue: Secondary | ICD-10-CM | POA: Diagnosis not present

## 2014-11-26 DIAGNOSIS — C220 Liver cell carcinoma: Secondary | ICD-10-CM | POA: Diagnosis not present

## 2014-11-26 DIAGNOSIS — L299 Pruritus, unspecified: Secondary | ICD-10-CM | POA: Diagnosis not present

## 2014-11-26 DIAGNOSIS — D696 Thrombocytopenia, unspecified: Secondary | ICD-10-CM | POA: Diagnosis not present

## 2014-11-26 DIAGNOSIS — Z6828 Body mass index (BMI) 28.0-28.9, adult: Secondary | ICD-10-CM | POA: Diagnosis not present

## 2014-11-26 DIAGNOSIS — K219 Gastro-esophageal reflux disease without esophagitis: Secondary | ICD-10-CM | POA: Diagnosis not present

## 2014-11-28 DIAGNOSIS — D696 Thrombocytopenia, unspecified: Secondary | ICD-10-CM | POA: Diagnosis not present

## 2014-12-16 DIAGNOSIS — H25813 Combined forms of age-related cataract, bilateral: Secondary | ICD-10-CM | POA: Diagnosis not present

## 2014-12-16 DIAGNOSIS — H5203 Hypermetropia, bilateral: Secondary | ICD-10-CM | POA: Diagnosis not present

## 2015-01-01 DIAGNOSIS — D696 Thrombocytopenia, unspecified: Secondary | ICD-10-CM | POA: Diagnosis not present

## 2015-01-02 DIAGNOSIS — K766 Portal hypertension: Secondary | ICD-10-CM | POA: Diagnosis not present

## 2015-01-02 DIAGNOSIS — R161 Splenomegaly, not elsewhere classified: Secondary | ICD-10-CM | POA: Diagnosis not present

## 2015-01-02 DIAGNOSIS — C220 Liver cell carcinoma: Secondary | ICD-10-CM | POA: Diagnosis not present

## 2015-01-14 DIAGNOSIS — H40033 Anatomical narrow angle, bilateral: Secondary | ICD-10-CM | POA: Diagnosis not present

## 2015-01-14 DIAGNOSIS — H25812 Combined forms of age-related cataract, left eye: Secondary | ICD-10-CM | POA: Diagnosis not present

## 2015-01-14 DIAGNOSIS — H2512 Age-related nuclear cataract, left eye: Secondary | ICD-10-CM | POA: Diagnosis not present

## 2015-01-28 DIAGNOSIS — H25811 Combined forms of age-related cataract, right eye: Secondary | ICD-10-CM | POA: Diagnosis not present

## 2015-01-28 DIAGNOSIS — H3530 Unspecified macular degeneration: Secondary | ICD-10-CM | POA: Diagnosis not present

## 2015-01-28 DIAGNOSIS — H2511 Age-related nuclear cataract, right eye: Secondary | ICD-10-CM | POA: Diagnosis not present

## 2015-02-03 DIAGNOSIS — D696 Thrombocytopenia, unspecified: Secondary | ICD-10-CM | POA: Diagnosis not present

## 2015-02-03 DIAGNOSIS — Z6827 Body mass index (BMI) 27.0-27.9, adult: Secondary | ICD-10-CM | POA: Diagnosis not present

## 2015-02-03 DIAGNOSIS — B1920 Unspecified viral hepatitis C without hepatic coma: Secondary | ICD-10-CM | POA: Diagnosis not present

## 2015-02-03 DIAGNOSIS — R739 Hyperglycemia, unspecified: Secondary | ICD-10-CM | POA: Diagnosis not present

## 2015-02-03 DIAGNOSIS — R6882 Decreased libido: Secondary | ICD-10-CM | POA: Diagnosis not present

## 2015-02-03 DIAGNOSIS — L918 Other hypertrophic disorders of the skin: Secondary | ICD-10-CM | POA: Diagnosis not present

## 2015-02-03 DIAGNOSIS — N529 Male erectile dysfunction, unspecified: Secondary | ICD-10-CM | POA: Diagnosis not present

## 2015-02-09 ENCOUNTER — Other Ambulatory Visit: Payer: Self-pay

## 2015-03-04 DIAGNOSIS — D696 Thrombocytopenia, unspecified: Secondary | ICD-10-CM | POA: Diagnosis not present

## 2015-04-02 DIAGNOSIS — C4401 Basal cell carcinoma of skin of lip: Secondary | ICD-10-CM | POA: Diagnosis not present

## 2015-04-06 DIAGNOSIS — D696 Thrombocytopenia, unspecified: Secondary | ICD-10-CM | POA: Diagnosis not present

## 2015-05-15 DIAGNOSIS — D696 Thrombocytopenia, unspecified: Secondary | ICD-10-CM | POA: Diagnosis not present

## 2015-06-25 DIAGNOSIS — C220 Liver cell carcinoma: Secondary | ICD-10-CM | POA: Diagnosis not present

## 2015-07-16 DIAGNOSIS — Z6838 Body mass index (BMI) 38.0-38.9, adult: Secondary | ICD-10-CM | POA: Diagnosis not present

## 2015-07-16 DIAGNOSIS — M25552 Pain in left hip: Secondary | ICD-10-CM | POA: Diagnosis not present

## 2015-07-22 DIAGNOSIS — M1612 Unilateral primary osteoarthritis, left hip: Secondary | ICD-10-CM | POA: Diagnosis not present

## 2015-07-22 DIAGNOSIS — M25552 Pain in left hip: Secondary | ICD-10-CM | POA: Diagnosis not present

## 2015-08-03 DIAGNOSIS — M1612 Unilateral primary osteoarthritis, left hip: Secondary | ICD-10-CM | POA: Diagnosis not present

## 2015-08-03 DIAGNOSIS — M25552 Pain in left hip: Secondary | ICD-10-CM | POA: Diagnosis not present

## 2015-08-03 DIAGNOSIS — M4726 Other spondylosis with radiculopathy, lumbar region: Secondary | ICD-10-CM | POA: Diagnosis not present

## 2015-08-05 ENCOUNTER — Other Ambulatory Visit: Payer: Self-pay | Admitting: Sports Medicine

## 2015-08-05 DIAGNOSIS — M545 Low back pain: Secondary | ICD-10-CM

## 2015-08-05 DIAGNOSIS — M79605 Pain in left leg: Secondary | ICD-10-CM

## 2015-08-13 ENCOUNTER — Ambulatory Visit
Admission: RE | Admit: 2015-08-13 | Discharge: 2015-08-13 | Disposition: A | Discharge status: Home / Self Care | Payer: Medicare Other | Source: Ambulatory Visit | Attending: Sports Medicine | Admitting: Sports Medicine

## 2015-08-13 DIAGNOSIS — M545 Low back pain: Secondary | ICD-10-CM

## 2015-08-13 DIAGNOSIS — M79605 Pain in left leg: Secondary | ICD-10-CM

## 2015-08-15 ENCOUNTER — Other Ambulatory Visit: Payer: Medicare Other

## 2015-08-18 ENCOUNTER — Other Ambulatory Visit: Payer: Medicare Other

## 2015-08-19 DIAGNOSIS — Z01812 Encounter for preprocedural laboratory examination: Secondary | ICD-10-CM | POA: Diagnosis not present

## 2015-08-19 DIAGNOSIS — M1612 Unilateral primary osteoarthritis, left hip: Secondary | ICD-10-CM | POA: Diagnosis not present

## 2015-08-19 DIAGNOSIS — M25552 Pain in left hip: Secondary | ICD-10-CM | POA: Diagnosis not present

## 2015-08-19 DIAGNOSIS — B182 Chronic viral hepatitis C: Secondary | ICD-10-CM | POA: Diagnosis not present

## 2015-08-19 DIAGNOSIS — C7951 Secondary malignant neoplasm of bone: Secondary | ICD-10-CM | POA: Diagnosis not present

## 2015-08-21 ENCOUNTER — Other Ambulatory Visit: Payer: Self-pay | Admitting: Sports Medicine

## 2015-08-21 DIAGNOSIS — C799 Secondary malignant neoplasm of unspecified site: Secondary | ICD-10-CM

## 2015-08-24 ENCOUNTER — Telehealth: Payer: Self-pay | Admitting: *Deleted

## 2015-08-24 NOTE — Telephone Encounter (Signed)
Dr. Alvy Bimler received referral from [facility] to see pt in [place].  Dr. Alvy Bimler sent urgent message; "I read the MRI report. The patient has impeding cord compression, a medical emergency. Recommend he goes to [facility] ER and see Neurosurgery there" Called pt and informed him of Dr. Calton Dach instructions.  He agrees to go to [facility] ED for urgent evaluation.   He denies any symptoms such as weakness/ numbess.   Informed him they can consult Dr. Alvy Bimler and she will f/u after he is seen by Neurosurgery.  He verbalized understanding and will go to [facility] ED today.

## 2015-08-31 ENCOUNTER — Ambulatory Visit
Admission: RE | Admit: 2015-08-31 | Discharge: 2015-08-31 | Disposition: A | Discharge status: Home / Self Care | Payer: Medicare Other | Source: Ambulatory Visit | Attending: Sports Medicine | Admitting: Sports Medicine

## 2015-08-31 DIAGNOSIS — C799 Secondary malignant neoplasm of unspecified site: Secondary | ICD-10-CM

## 2015-08-31 DIAGNOSIS — C220 Liver cell carcinoma: Secondary | ICD-10-CM | POA: Diagnosis not present

## 2015-08-31 MED ORDER — GADOBENATE DIMEGLUMINE 529 MG/ML IV SOLN
17.0000 mL | Freq: Once | INTRAVENOUS | Status: AC | PRN
  Administered 2015-08-31: 17 mL via INTRAVENOUS

## 2015-09-10 DIAGNOSIS — C220 Liver cell carcinoma: Secondary | ICD-10-CM | POA: Diagnosis not present

## 2015-09-10 DIAGNOSIS — Z6828 Body mass index (BMI) 28.0-28.9, adult: Secondary | ICD-10-CM | POA: Diagnosis not present

## 2015-09-10 DIAGNOSIS — C7951 Secondary malignant neoplasm of bone: Secondary | ICD-10-CM | POA: Diagnosis not present

## 2015-09-22 DIAGNOSIS — C7951 Secondary malignant neoplasm of bone: Secondary | ICD-10-CM | POA: Diagnosis not present

## 2015-09-22 DIAGNOSIS — Z510 Encounter for antineoplastic radiation therapy: Secondary | ICD-10-CM | POA: Diagnosis not present

## 2015-09-22 DIAGNOSIS — C220 Liver cell carcinoma: Secondary | ICD-10-CM | POA: Diagnosis not present

## 2015-09-22 DIAGNOSIS — M549 Dorsalgia, unspecified: Secondary | ICD-10-CM | POA: Diagnosis not present

## 2015-09-22 DIAGNOSIS — R202 Paresthesia of skin: Secondary | ICD-10-CM | POA: Diagnosis not present

## 2015-09-22 DIAGNOSIS — Z923 Personal history of irradiation: Secondary | ICD-10-CM | POA: Diagnosis not present

## 2015-09-30 DIAGNOSIS — Z510 Encounter for antineoplastic radiation therapy: Secondary | ICD-10-CM | POA: Diagnosis not present

## 2015-09-30 DIAGNOSIS — C220 Liver cell carcinoma: Secondary | ICD-10-CM | POA: Diagnosis not present

## 2015-09-30 DIAGNOSIS — R202 Paresthesia of skin: Secondary | ICD-10-CM | POA: Diagnosis not present

## 2015-09-30 DIAGNOSIS — C20 Malignant neoplasm of rectum: Secondary | ICD-10-CM | POA: Diagnosis not present

## 2015-09-30 DIAGNOSIS — C7951 Secondary malignant neoplasm of bone: Secondary | ICD-10-CM | POA: Diagnosis not present

## 2015-09-30 DIAGNOSIS — M549 Dorsalgia, unspecified: Secondary | ICD-10-CM | POA: Diagnosis not present

## 2015-10-02 DIAGNOSIS — M549 Dorsalgia, unspecified: Secondary | ICD-10-CM | POA: Diagnosis not present

## 2015-10-02 DIAGNOSIS — C220 Liver cell carcinoma: Secondary | ICD-10-CM | POA: Diagnosis not present

## 2015-10-02 DIAGNOSIS — C20 Malignant neoplasm of rectum: Secondary | ICD-10-CM | POA: Diagnosis not present

## 2015-10-02 DIAGNOSIS — R202 Paresthesia of skin: Secondary | ICD-10-CM | POA: Diagnosis not present

## 2015-10-02 DIAGNOSIS — C7951 Secondary malignant neoplasm of bone: Secondary | ICD-10-CM | POA: Diagnosis not present

## 2015-10-02 DIAGNOSIS — Z510 Encounter for antineoplastic radiation therapy: Secondary | ICD-10-CM | POA: Diagnosis not present

## 2015-10-12 DIAGNOSIS — C220 Liver cell carcinoma: Secondary | ICD-10-CM | POA: Diagnosis not present

## 2015-10-12 DIAGNOSIS — R202 Paresthesia of skin: Secondary | ICD-10-CM | POA: Diagnosis not present

## 2015-10-12 DIAGNOSIS — M549 Dorsalgia, unspecified: Secondary | ICD-10-CM | POA: Diagnosis not present

## 2015-10-12 DIAGNOSIS — C7951 Secondary malignant neoplasm of bone: Secondary | ICD-10-CM | POA: Diagnosis not present

## 2015-10-12 DIAGNOSIS — Z510 Encounter for antineoplastic radiation therapy: Secondary | ICD-10-CM | POA: Diagnosis not present

## 2015-10-13 DIAGNOSIS — C7951 Secondary malignant neoplasm of bone: Secondary | ICD-10-CM | POA: Diagnosis not present

## 2015-10-13 DIAGNOSIS — C220 Liver cell carcinoma: Secondary | ICD-10-CM | POA: Diagnosis not present

## 2015-10-13 DIAGNOSIS — Z510 Encounter for antineoplastic radiation therapy: Secondary | ICD-10-CM | POA: Diagnosis not present

## 2015-10-13 DIAGNOSIS — R202 Paresthesia of skin: Secondary | ICD-10-CM | POA: Diagnosis not present

## 2015-10-13 DIAGNOSIS — M549 Dorsalgia, unspecified: Secondary | ICD-10-CM | POA: Diagnosis not present

## 2015-10-14 DIAGNOSIS — Z510 Encounter for antineoplastic radiation therapy: Secondary | ICD-10-CM | POA: Diagnosis not present

## 2015-10-14 DIAGNOSIS — C20 Malignant neoplasm of rectum: Secondary | ICD-10-CM | POA: Diagnosis not present

## 2015-10-14 DIAGNOSIS — C220 Liver cell carcinoma: Secondary | ICD-10-CM | POA: Diagnosis not present

## 2015-10-14 DIAGNOSIS — C7951 Secondary malignant neoplasm of bone: Secondary | ICD-10-CM | POA: Diagnosis not present

## 2015-10-14 DIAGNOSIS — R202 Paresthesia of skin: Secondary | ICD-10-CM | POA: Diagnosis not present

## 2015-10-14 DIAGNOSIS — M549 Dorsalgia, unspecified: Secondary | ICD-10-CM | POA: Diagnosis not present

## 2015-10-15 DIAGNOSIS — M549 Dorsalgia, unspecified: Secondary | ICD-10-CM | POA: Diagnosis not present

## 2015-10-15 DIAGNOSIS — Z510 Encounter for antineoplastic radiation therapy: Secondary | ICD-10-CM | POA: Diagnosis not present

## 2015-10-15 DIAGNOSIS — C220 Liver cell carcinoma: Secondary | ICD-10-CM | POA: Diagnosis not present

## 2015-10-15 DIAGNOSIS — R202 Paresthesia of skin: Secondary | ICD-10-CM | POA: Diagnosis not present

## 2015-10-15 DIAGNOSIS — C7951 Secondary malignant neoplasm of bone: Secondary | ICD-10-CM | POA: Diagnosis not present

## 2015-10-15 DIAGNOSIS — C20 Malignant neoplasm of rectum: Secondary | ICD-10-CM | POA: Diagnosis not present

## 2015-10-16 DIAGNOSIS — Z510 Encounter for antineoplastic radiation therapy: Secondary | ICD-10-CM | POA: Diagnosis not present

## 2015-10-16 DIAGNOSIS — R202 Paresthesia of skin: Secondary | ICD-10-CM | POA: Diagnosis not present

## 2015-10-16 DIAGNOSIS — C220 Liver cell carcinoma: Secondary | ICD-10-CM | POA: Diagnosis not present

## 2015-10-16 DIAGNOSIS — C7951 Secondary malignant neoplasm of bone: Secondary | ICD-10-CM | POA: Diagnosis not present

## 2015-10-16 DIAGNOSIS — M549 Dorsalgia, unspecified: Secondary | ICD-10-CM | POA: Diagnosis not present

## 2015-10-16 DIAGNOSIS — C20 Malignant neoplasm of rectum: Secondary | ICD-10-CM | POA: Diagnosis not present

## 2015-10-19 DIAGNOSIS — M549 Dorsalgia, unspecified: Secondary | ICD-10-CM | POA: Diagnosis not present

## 2015-10-19 DIAGNOSIS — R202 Paresthesia of skin: Secondary | ICD-10-CM | POA: Diagnosis not present

## 2015-10-19 DIAGNOSIS — Z510 Encounter for antineoplastic radiation therapy: Secondary | ICD-10-CM | POA: Diagnosis not present

## 2015-10-19 DIAGNOSIS — C7951 Secondary malignant neoplasm of bone: Secondary | ICD-10-CM | POA: Diagnosis not present

## 2015-10-19 DIAGNOSIS — C220 Liver cell carcinoma: Secondary | ICD-10-CM | POA: Diagnosis not present

## 2015-10-19 DIAGNOSIS — C20 Malignant neoplasm of rectum: Secondary | ICD-10-CM | POA: Diagnosis not present

## 2015-10-20 DIAGNOSIS — R202 Paresthesia of skin: Secondary | ICD-10-CM | POA: Diagnosis not present

## 2015-10-20 DIAGNOSIS — M549 Dorsalgia, unspecified: Secondary | ICD-10-CM | POA: Diagnosis not present

## 2015-10-20 DIAGNOSIS — C20 Malignant neoplasm of rectum: Secondary | ICD-10-CM | POA: Diagnosis not present

## 2015-10-20 DIAGNOSIS — C220 Liver cell carcinoma: Secondary | ICD-10-CM | POA: Diagnosis not present

## 2015-10-20 DIAGNOSIS — Z510 Encounter for antineoplastic radiation therapy: Secondary | ICD-10-CM | POA: Diagnosis not present

## 2015-10-20 DIAGNOSIS — C7951 Secondary malignant neoplasm of bone: Secondary | ICD-10-CM | POA: Diagnosis not present

## 2015-10-21 DIAGNOSIS — C220 Liver cell carcinoma: Secondary | ICD-10-CM | POA: Diagnosis not present

## 2015-10-21 DIAGNOSIS — C20 Malignant neoplasm of rectum: Secondary | ICD-10-CM | POA: Diagnosis not present

## 2015-10-21 DIAGNOSIS — Z510 Encounter for antineoplastic radiation therapy: Secondary | ICD-10-CM | POA: Diagnosis not present

## 2015-10-21 DIAGNOSIS — R202 Paresthesia of skin: Secondary | ICD-10-CM | POA: Diagnosis not present

## 2015-10-21 DIAGNOSIS — M549 Dorsalgia, unspecified: Secondary | ICD-10-CM | POA: Diagnosis not present

## 2015-10-21 DIAGNOSIS — C7951 Secondary malignant neoplasm of bone: Secondary | ICD-10-CM | POA: Diagnosis not present

## 2015-10-23 DIAGNOSIS — M549 Dorsalgia, unspecified: Secondary | ICD-10-CM | POA: Diagnosis not present

## 2015-10-23 DIAGNOSIS — R202 Paresthesia of skin: Secondary | ICD-10-CM | POA: Diagnosis not present

## 2015-10-23 DIAGNOSIS — C7951 Secondary malignant neoplasm of bone: Secondary | ICD-10-CM | POA: Diagnosis not present

## 2015-10-23 DIAGNOSIS — C220 Liver cell carcinoma: Secondary | ICD-10-CM | POA: Diagnosis not present

## 2015-10-23 DIAGNOSIS — Z510 Encounter for antineoplastic radiation therapy: Secondary | ICD-10-CM | POA: Diagnosis not present

## 2015-10-23 DIAGNOSIS — C20 Malignant neoplasm of rectum: Secondary | ICD-10-CM | POA: Diagnosis not present

## 2015-10-26 DIAGNOSIS — Z510 Encounter for antineoplastic radiation therapy: Secondary | ICD-10-CM | POA: Diagnosis not present

## 2015-10-26 DIAGNOSIS — R202 Paresthesia of skin: Secondary | ICD-10-CM | POA: Diagnosis not present

## 2015-10-26 DIAGNOSIS — C7951 Secondary malignant neoplasm of bone: Secondary | ICD-10-CM | POA: Diagnosis not present

## 2015-10-26 DIAGNOSIS — C20 Malignant neoplasm of rectum: Secondary | ICD-10-CM | POA: Diagnosis not present

## 2015-10-26 DIAGNOSIS — C220 Liver cell carcinoma: Secondary | ICD-10-CM | POA: Diagnosis not present

## 2015-10-26 DIAGNOSIS — M549 Dorsalgia, unspecified: Secondary | ICD-10-CM | POA: Diagnosis not present

## 2015-10-27 DIAGNOSIS — D692 Other nonthrombocytopenic purpura: Secondary | ICD-10-CM | POA: Diagnosis not present

## 2015-11-20 DIAGNOSIS — M4726 Other spondylosis with radiculopathy, lumbar region: Secondary | ICD-10-CM | POA: Diagnosis not present

## 2015-11-20 DIAGNOSIS — B182 Chronic viral hepatitis C: Secondary | ICD-10-CM | POA: Diagnosis not present

## 2015-11-20 DIAGNOSIS — C7951 Secondary malignant neoplasm of bone: Secondary | ICD-10-CM | POA: Diagnosis not present

## 2015-11-23 DIAGNOSIS — Z6827 Body mass index (BMI) 27.0-27.9, adult: Secondary | ICD-10-CM | POA: Diagnosis not present

## 2015-11-23 DIAGNOSIS — N390 Urinary tract infection, site not specified: Secondary | ICD-10-CM | POA: Diagnosis not present

## 2015-12-24 DIAGNOSIS — Z1389 Encounter for screening for other disorder: Secondary | ICD-10-CM | POA: Diagnosis not present

## 2015-12-24 DIAGNOSIS — Z6828 Body mass index (BMI) 28.0-28.9, adult: Secondary | ICD-10-CM | POA: Diagnosis not present

## 2015-12-24 DIAGNOSIS — N390 Urinary tract infection, site not specified: Secondary | ICD-10-CM | POA: Diagnosis not present

## 2016-01-05 DIAGNOSIS — M4850XA Collapsed vertebra, not elsewhere classified, site unspecified, initial encounter for fracture: Secondary | ICD-10-CM | POA: Diagnosis not present

## 2016-01-05 DIAGNOSIS — C220 Liver cell carcinoma: Secondary | ICD-10-CM | POA: Diagnosis not present

## 2016-01-05 DIAGNOSIS — M47816 Spondylosis without myelopathy or radiculopathy, lumbar region: Secondary | ICD-10-CM | POA: Diagnosis not present

## 2016-01-05 DIAGNOSIS — Z880 Allergy status to penicillin: Secondary | ICD-10-CM | POA: Diagnosis not present

## 2016-01-05 DIAGNOSIS — M545 Low back pain: Secondary | ICD-10-CM | POA: Diagnosis not present

## 2016-01-05 DIAGNOSIS — Z7982 Long term (current) use of aspirin: Secondary | ICD-10-CM | POA: Diagnosis not present

## 2016-01-05 DIAGNOSIS — B1920 Unspecified viral hepatitis C without hepatic coma: Secondary | ICD-10-CM | POA: Diagnosis not present

## 2016-01-05 DIAGNOSIS — M5136 Other intervertebral disc degeneration, lumbar region: Secondary | ICD-10-CM | POA: Diagnosis not present

## 2016-01-05 DIAGNOSIS — G9520 Unspecified cord compression: Secondary | ICD-10-CM | POA: Diagnosis not present

## 2016-01-05 DIAGNOSIS — M5126 Other intervertebral disc displacement, lumbar region: Secondary | ICD-10-CM | POA: Diagnosis not present

## 2016-01-05 DIAGNOSIS — M6281 Muscle weakness (generalized): Secondary | ICD-10-CM | POA: Diagnosis not present

## 2016-01-05 DIAGNOSIS — C7951 Secondary malignant neoplasm of bone: Secondary | ICD-10-CM | POA: Diagnosis not present

## 2016-01-12 DIAGNOSIS — C220 Liver cell carcinoma: Secondary | ICD-10-CM | POA: Diagnosis not present

## 2016-01-12 DIAGNOSIS — M549 Dorsalgia, unspecified: Secondary | ICD-10-CM | POA: Diagnosis not present

## 2016-01-12 DIAGNOSIS — R202 Paresthesia of skin: Secondary | ICD-10-CM | POA: Diagnosis not present

## 2016-01-12 DIAGNOSIS — Z510 Encounter for antineoplastic radiation therapy: Secondary | ICD-10-CM | POA: Diagnosis not present

## 2016-01-12 DIAGNOSIS — C7951 Secondary malignant neoplasm of bone: Secondary | ICD-10-CM | POA: Diagnosis not present

## 2016-02-02 DIAGNOSIS — C7951 Secondary malignant neoplasm of bone: Secondary | ICD-10-CM | POA: Diagnosis not present

## 2016-02-02 DIAGNOSIS — M5416 Radiculopathy, lumbar region: Secondary | ICD-10-CM | POA: Diagnosis not present

## 2016-02-02 DIAGNOSIS — M549 Dorsalgia, unspecified: Secondary | ICD-10-CM | POA: Diagnosis not present

## 2016-02-02 DIAGNOSIS — R202 Paresthesia of skin: Secondary | ICD-10-CM | POA: Diagnosis not present

## 2016-02-02 DIAGNOSIS — C801 Malignant (primary) neoplasm, unspecified: Secondary | ICD-10-CM | POA: Diagnosis not present

## 2016-02-02 DIAGNOSIS — Z510 Encounter for antineoplastic radiation therapy: Secondary | ICD-10-CM | POA: Diagnosis not present

## 2016-02-02 DIAGNOSIS — C220 Liver cell carcinoma: Secondary | ICD-10-CM | POA: Diagnosis not present

## 2016-02-02 DIAGNOSIS — C20 Malignant neoplasm of rectum: Secondary | ICD-10-CM | POA: Diagnosis not present

## 2016-02-04 DIAGNOSIS — C801 Malignant (primary) neoplasm, unspecified: Secondary | ICD-10-CM | POA: Diagnosis not present

## 2016-02-04 DIAGNOSIS — M5416 Radiculopathy, lumbar region: Secondary | ICD-10-CM | POA: Diagnosis not present

## 2016-02-04 DIAGNOSIS — C7951 Secondary malignant neoplasm of bone: Secondary | ICD-10-CM | POA: Diagnosis not present

## 2016-02-05 DIAGNOSIS — M549 Dorsalgia, unspecified: Secondary | ICD-10-CM | POA: Diagnosis not present

## 2016-02-05 DIAGNOSIS — C20 Malignant neoplasm of rectum: Secondary | ICD-10-CM | POA: Diagnosis not present

## 2016-02-05 DIAGNOSIS — C220 Liver cell carcinoma: Secondary | ICD-10-CM | POA: Diagnosis not present

## 2016-02-05 DIAGNOSIS — C801 Malignant (primary) neoplasm, unspecified: Secondary | ICD-10-CM | POA: Diagnosis not present

## 2016-02-05 DIAGNOSIS — C7951 Secondary malignant neoplasm of bone: Secondary | ICD-10-CM | POA: Diagnosis not present

## 2016-02-05 DIAGNOSIS — M5416 Radiculopathy, lumbar region: Secondary | ICD-10-CM | POA: Diagnosis not present

## 2016-02-05 DIAGNOSIS — Z510 Encounter for antineoplastic radiation therapy: Secondary | ICD-10-CM | POA: Diagnosis not present

## 2016-02-05 DIAGNOSIS — R202 Paresthesia of skin: Secondary | ICD-10-CM | POA: Diagnosis not present

## 2016-02-11 DIAGNOSIS — R202 Paresthesia of skin: Secondary | ICD-10-CM | POA: Diagnosis not present

## 2016-02-11 DIAGNOSIS — C20 Malignant neoplasm of rectum: Secondary | ICD-10-CM | POA: Diagnosis not present

## 2016-02-11 DIAGNOSIS — C7951 Secondary malignant neoplasm of bone: Secondary | ICD-10-CM | POA: Diagnosis not present

## 2016-02-11 DIAGNOSIS — C220 Liver cell carcinoma: Secondary | ICD-10-CM | POA: Diagnosis not present

## 2016-02-11 DIAGNOSIS — M549 Dorsalgia, unspecified: Secondary | ICD-10-CM | POA: Diagnosis not present

## 2016-02-11 DIAGNOSIS — Z510 Encounter for antineoplastic radiation therapy: Secondary | ICD-10-CM | POA: Diagnosis not present

## 2016-02-12 DIAGNOSIS — M549 Dorsalgia, unspecified: Secondary | ICD-10-CM | POA: Diagnosis not present

## 2016-02-12 DIAGNOSIS — C220 Liver cell carcinoma: Secondary | ICD-10-CM | POA: Diagnosis not present

## 2016-02-12 DIAGNOSIS — Z510 Encounter for antineoplastic radiation therapy: Secondary | ICD-10-CM | POA: Diagnosis not present

## 2016-02-12 DIAGNOSIS — C20 Malignant neoplasm of rectum: Secondary | ICD-10-CM | POA: Diagnosis not present

## 2016-02-12 DIAGNOSIS — C7951 Secondary malignant neoplasm of bone: Secondary | ICD-10-CM | POA: Diagnosis not present

## 2016-02-12 DIAGNOSIS — R202 Paresthesia of skin: Secondary | ICD-10-CM | POA: Diagnosis not present

## 2016-02-15 DIAGNOSIS — C20 Malignant neoplasm of rectum: Secondary | ICD-10-CM | POA: Diagnosis not present

## 2016-02-15 DIAGNOSIS — M549 Dorsalgia, unspecified: Secondary | ICD-10-CM | POA: Diagnosis not present

## 2016-02-15 DIAGNOSIS — Z510 Encounter for antineoplastic radiation therapy: Secondary | ICD-10-CM | POA: Diagnosis not present

## 2016-02-15 DIAGNOSIS — C7951 Secondary malignant neoplasm of bone: Secondary | ICD-10-CM | POA: Diagnosis not present

## 2016-02-15 DIAGNOSIS — C801 Malignant (primary) neoplasm, unspecified: Secondary | ICD-10-CM | POA: Diagnosis not present

## 2016-02-15 DIAGNOSIS — R202 Paresthesia of skin: Secondary | ICD-10-CM | POA: Diagnosis not present

## 2016-02-15 DIAGNOSIS — C220 Liver cell carcinoma: Secondary | ICD-10-CM | POA: Diagnosis not present

## 2016-02-17 DIAGNOSIS — R202 Paresthesia of skin: Secondary | ICD-10-CM | POA: Diagnosis not present

## 2016-02-17 DIAGNOSIS — M549 Dorsalgia, unspecified: Secondary | ICD-10-CM | POA: Diagnosis not present

## 2016-02-17 DIAGNOSIS — Z510 Encounter for antineoplastic radiation therapy: Secondary | ICD-10-CM | POA: Diagnosis not present

## 2016-02-17 DIAGNOSIS — C20 Malignant neoplasm of rectum: Secondary | ICD-10-CM | POA: Diagnosis not present

## 2016-02-17 DIAGNOSIS — C801 Malignant (primary) neoplasm, unspecified: Secondary | ICD-10-CM | POA: Diagnosis not present

## 2016-02-17 DIAGNOSIS — C7951 Secondary malignant neoplasm of bone: Secondary | ICD-10-CM | POA: Diagnosis not present

## 2016-02-17 DIAGNOSIS — C220 Liver cell carcinoma: Secondary | ICD-10-CM | POA: Diagnosis not present

## 2016-03-08 DIAGNOSIS — E669 Obesity, unspecified: Secondary | ICD-10-CM | POA: Diagnosis not present

## 2016-03-08 DIAGNOSIS — R350 Frequency of micturition: Secondary | ICD-10-CM | POA: Diagnosis not present

## 2016-03-08 DIAGNOSIS — M549 Dorsalgia, unspecified: Secondary | ICD-10-CM | POA: Diagnosis not present

## 2016-03-08 DIAGNOSIS — Z6828 Body mass index (BMI) 28.0-28.9, adult: Secondary | ICD-10-CM | POA: Diagnosis not present

## 2016-03-08 DIAGNOSIS — N390 Urinary tract infection, site not specified: Secondary | ICD-10-CM | POA: Diagnosis not present

## 2016-03-08 DIAGNOSIS — E663 Overweight: Secondary | ICD-10-CM | POA: Diagnosis not present

## 2016-03-14 DIAGNOSIS — M549 Dorsalgia, unspecified: Secondary | ICD-10-CM | POA: Diagnosis not present

## 2016-03-14 DIAGNOSIS — R202 Paresthesia of skin: Secondary | ICD-10-CM | POA: Diagnosis not present

## 2016-03-14 DIAGNOSIS — C220 Liver cell carcinoma: Secondary | ICD-10-CM | POA: Diagnosis not present

## 2016-03-14 DIAGNOSIS — Z510 Encounter for antineoplastic radiation therapy: Secondary | ICD-10-CM | POA: Diagnosis not present

## 2016-03-14 DIAGNOSIS — C7951 Secondary malignant neoplasm of bone: Secondary | ICD-10-CM | POA: Diagnosis not present

## 2016-03-14 DIAGNOSIS — C801 Malignant (primary) neoplasm, unspecified: Secondary | ICD-10-CM | POA: Diagnosis not present

## 2016-03-25 DIAGNOSIS — C7951 Secondary malignant neoplasm of bone: Secondary | ICD-10-CM | POA: Diagnosis not present

## 2016-03-25 DIAGNOSIS — C801 Malignant (primary) neoplasm, unspecified: Secondary | ICD-10-CM | POA: Diagnosis not present

## 2016-03-25 DIAGNOSIS — C220 Liver cell carcinoma: Secondary | ICD-10-CM | POA: Diagnosis not present

## 2016-04-05 DIAGNOSIS — Z6827 Body mass index (BMI) 27.0-27.9, adult: Secondary | ICD-10-CM | POA: Diagnosis not present

## 2016-04-05 DIAGNOSIS — H6122 Impacted cerumen, left ear: Secondary | ICD-10-CM | POA: Diagnosis not present

## 2016-04-14 DIAGNOSIS — K7469 Other cirrhosis of liver: Secondary | ICD-10-CM | POA: Diagnosis not present

## 2016-04-14 DIAGNOSIS — M47816 Spondylosis without myelopathy or radiculopathy, lumbar region: Secondary | ICD-10-CM | POA: Diagnosis not present

## 2016-04-14 DIAGNOSIS — R911 Solitary pulmonary nodule: Secondary | ICD-10-CM | POA: Diagnosis not present

## 2016-04-14 DIAGNOSIS — R609 Edema, unspecified: Secondary | ICD-10-CM | POA: Diagnosis not present

## 2016-04-14 DIAGNOSIS — M5127 Other intervertebral disc displacement, lumbosacral region: Secondary | ICD-10-CM | POA: Diagnosis not present

## 2016-04-14 DIAGNOSIS — C220 Liver cell carcinoma: Secondary | ICD-10-CM | POA: Diagnosis not present

## 2016-04-14 DIAGNOSIS — R1900 Intra-abdominal and pelvic swelling, mass and lump, unspecified site: Secondary | ICD-10-CM | POA: Diagnosis not present

## 2016-04-14 DIAGNOSIS — R161 Splenomegaly, not elsewhere classified: Secondary | ICD-10-CM | POA: Diagnosis not present

## 2016-04-14 DIAGNOSIS — C7951 Secondary malignant neoplasm of bone: Secondary | ICD-10-CM | POA: Diagnosis not present

## 2016-04-14 DIAGNOSIS — R918 Other nonspecific abnormal finding of lung field: Secondary | ICD-10-CM | POA: Diagnosis not present

## 2016-04-14 DIAGNOSIS — K766 Portal hypertension: Secondary | ICD-10-CM | POA: Diagnosis not present

## 2016-04-15 DIAGNOSIS — B182 Chronic viral hepatitis C: Secondary | ICD-10-CM | POA: Diagnosis not present

## 2016-04-15 DIAGNOSIS — G893 Neoplasm related pain (acute) (chronic): Secondary | ICD-10-CM | POA: Diagnosis not present

## 2016-04-15 DIAGNOSIS — R202 Paresthesia of skin: Secondary | ICD-10-CM | POA: Diagnosis not present

## 2016-04-15 DIAGNOSIS — Z510 Encounter for antineoplastic radiation therapy: Secondary | ICD-10-CM | POA: Diagnosis not present

## 2016-04-15 DIAGNOSIS — Z79899 Other long term (current) drug therapy: Secondary | ICD-10-CM | POA: Diagnosis not present

## 2016-04-15 DIAGNOSIS — K7460 Unspecified cirrhosis of liver: Secondary | ICD-10-CM | POA: Diagnosis not present

## 2016-04-15 DIAGNOSIS — R29898 Other symptoms and signs involving the musculoskeletal system: Secondary | ICD-10-CM | POA: Diagnosis not present

## 2016-04-15 DIAGNOSIS — Z9889 Other specified postprocedural states: Secondary | ICD-10-CM | POA: Diagnosis not present

## 2016-04-15 DIAGNOSIS — Z880 Allergy status to penicillin: Secondary | ICD-10-CM | POA: Diagnosis not present

## 2016-04-15 DIAGNOSIS — C220 Liver cell carcinoma: Secondary | ICD-10-CM | POA: Diagnosis not present

## 2016-04-15 DIAGNOSIS — Z923 Personal history of irradiation: Secondary | ICD-10-CM | POA: Diagnosis not present

## 2016-04-15 DIAGNOSIS — M549 Dorsalgia, unspecified: Secondary | ICD-10-CM | POA: Diagnosis not present

## 2016-04-15 DIAGNOSIS — I4891 Unspecified atrial fibrillation: Secondary | ICD-10-CM | POA: Diagnosis not present

## 2016-04-15 DIAGNOSIS — C801 Malignant (primary) neoplasm, unspecified: Secondary | ICD-10-CM | POA: Diagnosis not present

## 2016-04-15 DIAGNOSIS — D759 Disease of blood and blood-forming organs, unspecified: Secondary | ICD-10-CM | POA: Diagnosis not present

## 2016-04-15 DIAGNOSIS — C7951 Secondary malignant neoplasm of bone: Secondary | ICD-10-CM | POA: Diagnosis not present

## 2016-04-15 DIAGNOSIS — M792 Neuralgia and neuritis, unspecified: Secondary | ICD-10-CM | POA: Diagnosis not present

## 2016-04-22 DIAGNOSIS — H26499 Other secondary cataract, unspecified eye: Secondary | ICD-10-CM | POA: Diagnosis not present

## 2016-04-25 DIAGNOSIS — Z6826 Body mass index (BMI) 26.0-26.9, adult: Secondary | ICD-10-CM | POA: Diagnosis not present

## 2016-04-25 DIAGNOSIS — J181 Lobar pneumonia, unspecified organism: Secondary | ICD-10-CM | POA: Diagnosis not present

## 2016-05-20 DIAGNOSIS — H169 Unspecified keratitis: Secondary | ICD-10-CM | POA: Diagnosis not present

## 2016-06-13 DIAGNOSIS — C7951 Secondary malignant neoplasm of bone: Secondary | ICD-10-CM | POA: Diagnosis not present

## 2016-06-13 DIAGNOSIS — B182 Chronic viral hepatitis C: Secondary | ICD-10-CM | POA: Diagnosis not present

## 2016-06-13 DIAGNOSIS — C801 Malignant (primary) neoplasm, unspecified: Secondary | ICD-10-CM | POA: Diagnosis not present

## 2016-06-13 DIAGNOSIS — K7460 Unspecified cirrhosis of liver: Secondary | ICD-10-CM | POA: Diagnosis not present

## 2016-06-13 DIAGNOSIS — R938 Abnormal findings on diagnostic imaging of other specified body structures: Secondary | ICD-10-CM | POA: Diagnosis not present

## 2016-06-16 DIAGNOSIS — C228 Malignant neoplasm of liver, primary, unspecified as to type: Secondary | ICD-10-CM | POA: Diagnosis present

## 2016-06-16 DIAGNOSIS — C7951 Secondary malignant neoplasm of bone: Secondary | ICD-10-CM | POA: Diagnosis not present

## 2016-06-16 DIAGNOSIS — C220 Liver cell carcinoma: Secondary | ICD-10-CM | POA: Diagnosis not present

## 2016-06-16 DIAGNOSIS — D492 Neoplasm of unspecified behavior of bone, soft tissue, and skin: Secondary | ICD-10-CM | POA: Diagnosis not present

## 2016-06-16 DIAGNOSIS — M5416 Radiculopathy, lumbar region: Secondary | ICD-10-CM | POA: Diagnosis not present

## 2016-06-16 DIAGNOSIS — C412 Malignant neoplasm of vertebral column: Secondary | ICD-10-CM | POA: Diagnosis not present

## 2016-06-16 DIAGNOSIS — Z9889 Other specified postprocedural states: Secondary | ICD-10-CM | POA: Diagnosis not present

## 2016-07-12 DIAGNOSIS — R197 Diarrhea, unspecified: Secondary | ICD-10-CM | POA: Diagnosis not present

## 2016-07-12 DIAGNOSIS — C7951 Secondary malignant neoplasm of bone: Secondary | ICD-10-CM | POA: Diagnosis not present

## 2016-07-12 DIAGNOSIS — C220 Liver cell carcinoma: Secondary | ICD-10-CM | POA: Diagnosis not present

## 2016-07-12 DIAGNOSIS — R1030 Lower abdominal pain, unspecified: Secondary | ICD-10-CM | POA: Diagnosis not present

## 2016-07-15 ENCOUNTER — Encounter (HOSPITAL_COMMUNITY): Payer: Self-pay | Admitting: Emergency Medicine

## 2016-07-15 ENCOUNTER — Emergency Department (HOSPITAL_COMMUNITY): Payer: Medicare Other

## 2016-07-15 ENCOUNTER — Inpatient Hospital Stay (HOSPITAL_COMMUNITY)
Admission: EM | Admit: 2016-07-15 | Discharge: 2016-07-23 | DRG: 309 | Disposition: A | Discharge status: Home Health | Payer: Medicare Other | Attending: Family Medicine | Admitting: Family Medicine

## 2016-07-15 DIAGNOSIS — R1900 Intra-abdominal and pelvic swelling, mass and lump, unspecified site: Secondary | ICD-10-CM | POA: Diagnosis not present

## 2016-07-15 DIAGNOSIS — N179 Acute kidney failure, unspecified: Secondary | ICD-10-CM

## 2016-07-15 DIAGNOSIS — I481 Persistent atrial fibrillation: Secondary | ICD-10-CM | POA: Diagnosis present

## 2016-07-15 DIAGNOSIS — I719 Aortic aneurysm of unspecified site, without rupture: Secondary | ICD-10-CM | POA: Diagnosis present

## 2016-07-15 DIAGNOSIS — K219 Gastro-esophageal reflux disease without esophagitis: Secondary | ICD-10-CM | POA: Diagnosis present

## 2016-07-15 DIAGNOSIS — A0472 Enterocolitis due to Clostridium difficile, not specified as recurrent: Secondary | ICD-10-CM

## 2016-07-15 DIAGNOSIS — D649 Anemia, unspecified: Secondary | ICD-10-CM | POA: Diagnosis present

## 2016-07-15 DIAGNOSIS — K7460 Unspecified cirrhosis of liver: Secondary | ICD-10-CM | POA: Diagnosis present

## 2016-07-15 DIAGNOSIS — D696 Thrombocytopenia, unspecified: Secondary | ICD-10-CM | POA: Diagnosis present

## 2016-07-15 DIAGNOSIS — Z79899 Other long term (current) drug therapy: Secondary | ICD-10-CM

## 2016-07-15 DIAGNOSIS — Z87891 Personal history of nicotine dependence: Secondary | ICD-10-CM

## 2016-07-15 DIAGNOSIS — Z7982 Long term (current) use of aspirin: Secondary | ICD-10-CM

## 2016-07-15 DIAGNOSIS — I4891 Unspecified atrial fibrillation: Secondary | ICD-10-CM | POA: Diagnosis not present

## 2016-07-15 DIAGNOSIS — E860 Dehydration: Secondary | ICD-10-CM | POA: Diagnosis present

## 2016-07-15 DIAGNOSIS — I480 Paroxysmal atrial fibrillation: Secondary | ICD-10-CM | POA: Diagnosis not present

## 2016-07-15 DIAGNOSIS — R748 Abnormal levels of other serum enzymes: Secondary | ICD-10-CM

## 2016-07-15 DIAGNOSIS — C220 Liver cell carcinoma: Secondary | ICD-10-CM | POA: Diagnosis not present

## 2016-07-15 DIAGNOSIS — C7970 Secondary malignant neoplasm of unspecified adrenal gland: Secondary | ICD-10-CM | POA: Diagnosis present

## 2016-07-15 DIAGNOSIS — E278 Other specified disorders of adrenal gland: Secondary | ICD-10-CM | POA: Diagnosis not present

## 2016-07-15 DIAGNOSIS — E876 Hypokalemia: Secondary | ICD-10-CM | POA: Diagnosis not present

## 2016-07-15 DIAGNOSIS — C7951 Secondary malignant neoplasm of bone: Secondary | ICD-10-CM | POA: Diagnosis present

## 2016-07-15 DIAGNOSIS — I712 Thoracic aortic aneurysm, without rupture: Secondary | ICD-10-CM | POA: Diagnosis present

## 2016-07-15 DIAGNOSIS — I2510 Atherosclerotic heart disease of native coronary artery without angina pectoris: Secondary | ICD-10-CM | POA: Diagnosis present

## 2016-07-15 DIAGNOSIS — C7989 Secondary malignant neoplasm of other specified sites: Secondary | ICD-10-CM | POA: Diagnosis not present

## 2016-07-15 DIAGNOSIS — I340 Nonrheumatic mitral (valve) insufficiency: Secondary | ICD-10-CM

## 2016-07-15 DIAGNOSIS — B1920 Unspecified viral hepatitis C without hepatic coma: Secondary | ICD-10-CM | POA: Diagnosis present

## 2016-07-15 DIAGNOSIS — M6281 Muscle weakness (generalized): Secondary | ICD-10-CM

## 2016-07-15 DIAGNOSIS — Z85828 Personal history of other malignant neoplasm of skin: Secondary | ICD-10-CM

## 2016-07-15 DIAGNOSIS — K6289 Other specified diseases of anus and rectum: Secondary | ICD-10-CM | POA: Diagnosis not present

## 2016-07-15 DIAGNOSIS — R7989 Other specified abnormal findings of blood chemistry: Secondary | ICD-10-CM

## 2016-07-15 DIAGNOSIS — R0602 Shortness of breath: Secondary | ICD-10-CM | POA: Diagnosis not present

## 2016-07-15 DIAGNOSIS — Z880 Allergy status to penicillin: Secondary | ICD-10-CM

## 2016-07-15 DIAGNOSIS — R197 Diarrhea, unspecified: Secondary | ICD-10-CM

## 2016-07-15 DIAGNOSIS — K766 Portal hypertension: Secondary | ICD-10-CM | POA: Diagnosis present

## 2016-07-15 DIAGNOSIS — R778 Other specified abnormalities of plasma proteins: Secondary | ICD-10-CM

## 2016-07-15 DIAGNOSIS — R0600 Dyspnea, unspecified: Secondary | ICD-10-CM

## 2016-07-15 DIAGNOSIS — R918 Other nonspecific abnormal finding of lung field: Secondary | ICD-10-CM | POA: Diagnosis not present

## 2016-07-15 HISTORY — DX: Enterocolitis due to Clostridium difficile, not specified as recurrent: A04.72

## 2016-07-15 HISTORY — DX: Dyspnea, unspecified: R06.00

## 2016-07-15 LAB — HEPATIC FUNCTION PANEL
ALBUMIN: 2.5 g/dL — AB (ref 3.5–5.0)
ALT: 23 U/L (ref 17–63)
AST: 39 U/L (ref 15–41)
Alkaline Phosphatase: 44 U/L (ref 38–126)
BILIRUBIN DIRECT: 1.7 mg/dL — AB (ref 0.1–0.5)
BILIRUBIN TOTAL: 4.1 mg/dL — AB (ref 0.3–1.2)
Indirect Bilirubin: 2.4 mg/dL — ABNORMAL HIGH (ref 0.3–0.9)
Total Protein: 6.1 g/dL — ABNORMAL LOW (ref 6.5–8.1)

## 2016-07-15 LAB — CBC
HEMATOCRIT: 36.2 % — AB (ref 39.0–52.0)
Hemoglobin: 12 g/dL — ABNORMAL LOW (ref 13.0–17.0)
MCH: 31.3 pg (ref 26.0–34.0)
MCHC: 33.1 g/dL (ref 30.0–36.0)
MCV: 94.3 fL (ref 78.0–100.0)
PLATELETS: 133 10*3/uL — AB (ref 150–400)
RBC: 3.84 MIL/uL — AB (ref 4.22–5.81)
RDW: 15.4 % (ref 11.5–15.5)
WBC: 13.7 10*3/uL — AB (ref 4.0–10.5)

## 2016-07-15 LAB — TROPONIN I
TROPONIN I: 0.03 ng/mL — AB (ref <0.03)
Troponin I: 0.09 ng/mL (ref <0.03)

## 2016-07-15 LAB — BASIC METABOLIC PANEL
Anion gap: 12 (ref 5–15)
BUN: 24 mg/dL — ABNORMAL HIGH (ref 6–20)
CO2: 20 mmol/L — ABNORMAL LOW (ref 22–32)
Calcium: 8.9 mg/dL (ref 8.9–10.3)
Chloride: 105 mmol/L (ref 101–111)
Creatinine, Ser: 1.47 mg/dL — ABNORMAL HIGH (ref 0.61–1.24)
GFR calc non Af Amer: 40 mL/min — ABNORMAL LOW (ref >60)
GFR, EST AFRICAN AMERICAN: 47 mL/min — AB (ref >60)
Glucose, Bld: 211 mg/dL — ABNORMAL HIGH (ref 65–99)
POTASSIUM: 3.6 mmol/L (ref 3.5–5.1)
SODIUM: 137 mmol/L (ref 135–145)

## 2016-07-15 LAB — C DIFFICILE QUICK SCREEN W PCR REFLEX
C DIFFICILE (CDIFF) TOXIN: POSITIVE — AB
C DIFFICLE (CDIFF) ANTIGEN: POSITIVE — AB
C Diff interpretation: DETECTED

## 2016-07-15 LAB — LIPASE, BLOOD: LIPASE: 18 U/L (ref 11–51)

## 2016-07-15 LAB — TSH: TSH: 2.474 u[IU]/mL (ref 0.350–4.500)

## 2016-07-15 LAB — BRAIN NATRIURETIC PEPTIDE: B NATRIURETIC PEPTIDE 5: 474.9 pg/mL — AB (ref 0.0–100.0)

## 2016-07-15 MED ORDER — METRONIDAZOLE 500 MG PO TABS
500.0000 mg | ORAL_TABLET | Freq: Four times a day (QID) | ORAL | Status: DC
  Administered 2016-07-16: 500 mg via ORAL

## 2016-07-15 MED ORDER — METRONIDAZOLE 500 MG PO TABS
ORAL_TABLET | ORAL | Status: AC
  Filled 2016-07-15: qty 1

## 2016-07-15 MED ORDER — SODIUM CHLORIDE 0.9% FLUSH
3.0000 mL | Freq: Two times a day (BID) | INTRAVENOUS | Status: DC
  Administered 2016-07-15 – 2016-07-23 (×11): 3 mL via INTRAVENOUS

## 2016-07-15 MED ORDER — DILTIAZEM HCL 100 MG IV SOLR
5.0000 mg/h | Freq: Once | INTRAVENOUS | Status: AC
  Administered 2016-07-15: 5 mg/h via INTRAVENOUS
  Filled 2016-07-15: qty 100

## 2016-07-15 MED ORDER — DILTIAZEM HCL 25 MG/5ML IV SOLN
20.0000 mg | Freq: Once | INTRAVENOUS | Status: DC
  Administered 2016-07-15: 10 mg via INTRAVENOUS
  Filled 2016-07-15: qty 5

## 2016-07-15 MED ORDER — SODIUM CHLORIDE 0.9 % IV SOLN
Freq: Once | INTRAVENOUS | Status: AC
  Administered 2016-07-15: 14:00:00 via INTRAVENOUS

## 2016-07-15 MED ORDER — SODIUM CHLORIDE 0.9 % IV BOLUS (SEPSIS)
1000.0000 mL | Freq: Once | INTRAVENOUS | Status: AC
  Administered 2016-07-15: 1000 mL via INTRAVENOUS

## 2016-07-15 MED ORDER — IOPAMIDOL (ISOVUE-300) INJECTION 61%
INTRAVENOUS | Status: AC
  Administered 2016-07-15: 75 mL
  Filled 2016-07-15: qty 75

## 2016-07-15 MED ORDER — ENOXAPARIN SODIUM 40 MG/0.4ML ~~LOC~~ SOLN
40.0000 mg | Freq: Every day | SUBCUTANEOUS | Status: DC
  Administered 2016-07-16 – 2016-07-19 (×4): 40 mg via SUBCUTANEOUS
  Filled 2016-07-15 (×4): qty 0.4

## 2016-07-15 MED ORDER — CHOLESTYRAMINE 4 G PO PACK
4.0000 g | PACK | Freq: Every day | ORAL | Status: DC | PRN
  Filled 2016-07-15: qty 1

## 2016-07-15 MED ORDER — CIPROFLOXACIN HCL 500 MG PO TABS
500.0000 mg | ORAL_TABLET | Freq: Two times a day (BID) | ORAL | Status: DC
  Administered 2016-07-16: 500 mg via ORAL

## 2016-07-15 MED ORDER — DEXTROSE 5 % IV SOLN
5.0000 mg/h | Freq: Once | INTRAVENOUS | Status: DC
  Filled 2016-07-15: qty 100

## 2016-07-15 MED ORDER — CIPROFLOXACIN HCL 500 MG PO TABS
ORAL_TABLET | ORAL | Status: AC
  Filled 2016-07-15: qty 1

## 2016-07-15 MED ORDER — ENOXAPARIN SODIUM 40 MG/0.4ML ~~LOC~~ SOLN
40.0000 mg | SUBCUTANEOUS | Status: DC
  Administered 2016-07-15: 40 mg via SUBCUTANEOUS
  Filled 2016-07-15: qty 0.4

## 2016-07-15 NOTE — Progress Notes (Signed)
ANTICOAGULATION CONSULT NOTE - Initial Consult  Pharmacy Consult for Lovenox Indication: VTE prophylaxis  Labs:  Recent Labs  07/15/16 1256 07/15/16 2005  HGB 12.0*  --   HCT 36.2*  --   PLT 133*  --   CREATININE 1.47*  --   TROPONINI 0.09* 0.03*    Estimated Creatinine Clearance: 32.4 mL/min (by C-G formula based on SCr of 1.47 mg/dL (H)).   Assessment/Plan:  82yo male admitted w/ Afib, poor candidate for anticoag, to begin DVT Px.  Will start Lovenox '40mg'$  SQ Q24H, but pt is borderline for dose adjustment so will follow SCr and CBC.  Wynona Neat, PharmD, BCPS  07/15/2016,11:30 PM

## 2016-07-15 NOTE — ED Triage Notes (Signed)
IV start and lab draw attem pted in triage, unsucessful.

## 2016-07-15 NOTE — ED Notes (Signed)
Pt unable to give a urine sample at this time.

## 2016-07-15 NOTE — ED Triage Notes (Signed)
Pt here for SOB and Abdominal pain, pt showes AFIB RVR on monitor, 180, pt EKG done int riage and brought straight to room. Hx of afib, pt states hes not on blood thinners.

## 2016-07-15 NOTE — ED Provider Notes (Signed)
[facility] DEPT Provider Note   CSN: [id number] Arrival date & time: 07/15/16  1233     History   Chief Complaint Chief Complaint  Patient presents with  . Tachycardia    HPI Lee Allen is a 82 y.o. male.  HPI 82 year old male who presents with atrial fibrillation with RVR. He has a history of paroxysmal atrial fibrillation, no longer on anticoagulation, hepatitis C cirrhosis complicated by hepatocellular carcinoma w/ history of spinal mass s/p radiation therapy, coronary artery disease, and thoracic aortic aneurysm. History provided by patient and his daughter who states that he has been having diarrhea over the past 5 days, greater than 4-5 episodes per day. No nausea, vomiting, but associating constant suprapubic and lower abdominal pain. No medications for pain.  No dysuria or frequency. States that he has been having palpitations over the past 2 days, but no chest pain, difficulty breathing, syncope or near syncope. Endorses decreased appetite and decreased by mouth intake. Seen by his GI doctor today, and noted to be in atrial fibrillation with RVR. He was sent to the ED for ongoing management. Daughter reports low-grade fevers of 99 Fahrenheit.  Past Medical History:  Diagnosis Date  . Ascending aorta dilation (HCC)    4 cm by chest CT 2/14  . Atrial flutter ([place])    09/2012 s/p TEE/DCCV  . CAD (coronary artery disease)    coronary calcification by chest CT 09/2012  . GERD (gastroesophageal reflux disease)   . [place] (hepatocellular carcinoma) ([place])    s/p ablation, with extension to Abdominal/chest wall  . Hepatitis C   . History of tobacco abuse    quit 1972  . Liver cirrhosis ([place])   . Nephrolithiasis   . Shingles   . Skin cancer     Patient Active Problem List   Diagnosis Date Noted  . Atrial fibrillation with RVR ([place]) 07/15/2016  . Blunt abdominal trauma 11/18/2013  . Thrombocytopenia ([place]) 11/18/2013  . Atrial fibrillation ([place]) 11/18/2013  .  Anticoagulated on Coumadin 11/18/2013  . Hypovolemic shock ([place]) 11/18/2013  . Acute blood loss anemia 11/17/2013  . Pelvic fracture ([place]) 11/15/2013  . Cirrhosis of liver due to hepatitis C 10/05/2012  . Arrhythmia 12/23/2010    Past Surgical History:  Procedure Laterality Date  . ABLATION  2012   liver  . CARDIOVERSION N/A 10/04/2012   Procedure: CARDIOVERSION;  Surgeon: Peter M Martinique, MD;  Location: [facility] ENDOSCOPY;  Service: Cardiovascular;  Laterality: N/A;  . LITHOTRIPSY    . SKIN CANCER EXCISION    . TEE WITHOUT CARDIOVERSION N/A 10/04/2012   Procedure: TRANSESOPHAGEAL ECHOCARDIOGRAM (TEE);  Surgeon: Peter M Martinique, MD;  Location: [facility] ENDOSCOPY;  Service: Cardiovascular;  Laterality: N/A;       Home Medications    Prior to Admission medications   Medication Sig Start Date End Date Taking? Authorizing Provider  acetaminophen (TYLENOL) 325 MG tablet Take 650 mg by mouth every 6 (six) hours as needed.   Yes Historical Provider, MD  aspirin EC 81 MG tablet Take 81 mg by mouth daily as needed. FOR  CARDIOVASCULAR   Yes Historical Provider, MD  cholestyramine (QUESTRAN) 4 g packet Take 1 packet by mouth daily as needed for itching. 07/13/16  Yes Historical Provider, MD  gabapentin (NEURONTIN) 300 MG capsule Take 300 mg by mouth 2 (two) times daily. 07/13/16  Yes Historical Provider, MD  loperamide (IMODIUM A-D) 2 MG tablet Take 2 mg by mouth 4 (four) times daily as needed for diarrhea or  loose stools.   Yes Historical Provider, MD  naproxen sodium (ANAPROX) 220 MG tablet Take 220 mg by mouth daily as needed (for pain).   Yes Historical Provider, MD    Family History Family History  Problem Relation Age of Onset  . Leukemia Father     Social History Social History  Substance Use Topics  . Smoking status: Former Smoker    Types: Cigarettes    Quit date: 08/15/1970  . Smokeless tobacco: Not on file  . Alcohol use 1.8 oz/week    3 Cans of beer per week     Comment:  occasionally     Allergies   Penicillins   Review of Systems Review of Systems 10/14 systems reviewed and are negative other than those stated in the HPI   Physical Exam Updated Vital Signs BP 127/70   Pulse 117   Temp 98 F (36.7 C) (Oral)   Resp 25   SpO2 96%   Physical Exam Physical Exam  Nursing note and vitals reviewed. Constitutional: Non-toxic, and in no acute distress Head: Normocephalic and atraumatic.  Mouth/Throat: Oropharynx is clear and moist.  Neck: Normal range of motion. Neck supple.  Cardiovascular: Tachycardic rate and irregularly irregular rhythm.   Pulmonary/Chest: Effort normal and breath sounds normal.  Abdominal: Soft. There is LLQ and suprapubic tenderness. There is no rebound and no guarding.  Musculoskeletal: Normal range of motion.  Neurological: Alert, no facial droop, fluent speech, moves all extremities symmetrically Skin: Skin is warm and dry.  Psychiatric: Cooperative   ED Treatments / Results  Labs (all labs ordered are listed, but only abnormal results are displayed) Labs Reviewed  BASIC METABOLIC PANEL - Abnormal; Notable for the following:       Result Value   CO2 20 (*)    Glucose, Bld 211 (*)    BUN 24 (*)    Creatinine, Ser 1.47 (*)    GFR calc non Af Amer 40 (*)    GFR calc Af Amer 47 (*)    All other components within normal limits  CBC - Abnormal; Notable for the following:    WBC 13.7 (*)    RBC 3.84 (*)    Hemoglobin 12.0 (*)    HCT 36.2 (*)    Platelets 133 (*)    All other components within normal limits  TROPONIN I - Abnormal; Notable for the following:    Troponin I 0.09 (*)    All other components within normal limits  HEPATIC FUNCTION PANEL - Abnormal; Notable for the following:    Total Protein 6.1 (*)    Albumin 2.5 (*)    Total Bilirubin 4.1 (*)    Bilirubin, Direct 1.7 (*)    Indirect Bilirubin 2.4 (*)    All other components within normal limits  C DIFFICILE QUICK SCREEN W PCR REFLEX  LIPASE,  BLOOD  URINALYSIS, ROUTINE W REFLEX MICROSCOPIC (NOT AT [facility])    EKG  EKG Interpretation  Date/Time:  Friday July 15 2016 12:41:22 EST Ventricular Rate:  182 PR Interval:    QRS Duration: 90 QT Interval:  246 QTC Calculation: 428 R Axis:   -34 Text Interpretation:  Atrial fibrillation with rapid ventricular response with premature ventricular or aberrantly conducted complexes Left axis deviation Minimal voltage criteria for LVH, may be normal variant ST & T wave abnormality, consider lateral ischemia Abnormal ECG h/o of PAF in atrial fib w/ RVR Confirmed by Doniesha Landau MD, Bayla Mcgovern ([id number]) on 07/15/2016 12:54:54 PM  Radiology Ct Abdomen Pelvis W Contrast  Result Date: 07/15/2016 CLINICAL DATA:  Low back pain near hips for 2 weeks. History of cirrhosis and metastatic hepatocellular carcinoma with L3 metastasis. EXAM: CT ABDOMEN AND PELVIS WITH CONTRAST TECHNIQUE: Multidetector CT imaging of the abdomen and pelvis was performed using the standard protocol following bolus administration of intravenous contrast. CONTRAST:  76m ISOVUE-300 IOPAMIDOL (ISOVUE-300) INJECTION 61% COMPARISON:  MRI of liver August 31, 2015 and MRI of the lumbar spine August 13, 2015 and CTA aorta June 04, 2014. Of the of an CT abdomen and pelvis November 15, 2013 FINDINGS: LOWER CHEST: Lung bases are clear. The heart is mildly enlarged. Mild coronary artery calcifications. No pericardial effusions. HEPATOBILIARY: 10 x 15 mm lesion segment 5/6 is stable to slightly smaller than prior MRI consistent with treated neoplasm. Multiple hepatic granulomas. Lobular liver contour with overall small liver consistent with cirrhosis. Enlarged, patent main portal vein with recannulized periumbilical vein. PANCREAS: Stable 1.7 x 2.3 cm cystic lesion body of the pancreas. SPLEEN: Worsening splenomegaly. ADRENALS/URINARY TRACT: Kidneys are orthotopic, demonstrating symmetric enhancement. No nephrolithiasis, hydronephrosis or solid renal  masses. Multiple benign appearing renal cysts measure up to 5.5 cm on the LEFT. The unopacified ureters are normal in course and caliber. Delayed imaging through the kidneys demonstrates symmetric prompt contrast excretion within the proximal urinary collecting system. Urinary bladder is partially distended and unremarkable. 2 x 3.2 cm LEFT adrenal mass. Thickened RIGHT adrenal gland. STOMACH/BOWEL: Thickened rectum with perirectal fat stranding. Mild diffuse colonic wall thickening, pericolonic fat stranding. Normal appendix. No bowel obstruction. VASCULAR/LYMPHATIC: Aortoiliac vessels are mildly ectatic with moderate calcific atherosclerosis. Matted abnormal soft tissue about the celiac axis. REPRODUCTIVE: Mild prostatomegaly with coarse calcifications. OTHER: Trace free fluid in LEFT pericolic gutter. No abscess or drainable fluid collections. No intraperitoneal free air. MUSCULOSKELETAL: 2.4 cm RIGHT chest wall intercostal mass corresponding to metastatic deposit on prior MRI. Metallic foreign body RIGHT pelvic wall suggest coil embolic material. Chronic deformity RIGHT iliac bone. Moderate degenerative change of the LEFT hip. Lytic lesion with expansile soft tissue component LEFT L3 vertebral body extending to the spinal canal, status post posterior decompression at this level. Tumor completely effaces the LEFT L3-4 neural foramen. IMPRESSION: Colitis/ proctitis without bowel obstruction. Findings of portal hypertension and cirrhosis with stable to smaller treated hepatic lesion. New 2 x 3.2 cm LEFT adrenal mass, with thickened RIGHT adrenal gland, in the setting of cancer this is most compatible with new metastasis fell. Abnormal soft tissue about the celiac axis most compatible with pathologic lymphadenopathy. Progressed L3 metastasis, status post interval posterior decompression. Severe LEFT L3-4 neural foraminal narrowing. Stable RIGHT chest wall metastasis. Electronically Signed   By: CElon Alas M.D.   On: 07/15/2016 16:39   Dg Chest Portable 1 View  Result Date: 07/15/2016 CLINICAL DATA:  Shortness of Breath EXAM: PORTABLE CHEST 1 VIEW COMPARISON:  None. FINDINGS: Cardiac shadow is at the upper limits of normal in size. Prominence of mediastinum is noted related to the portable technique. The lungs are well-aerated without focal infiltrate or sizable effusion. No acute bony abnormality is seen. IMPRESSION: No active disease. Electronically Signed   By: MInez CatalinaM.D.   On: 07/15/2016 15:28    Procedures Procedures (including critical care time) CRITICAL CARE Performed by: DForde Dandy  Total critical care time: 40 minutes  Critical care time was exclusive of separately billable procedures and treating other patients.  Critical care was necessary to treat or prevent imminent or  life-threatening deterioration.  Critical care was time spent personally by me on the following activities: development of treatment plan with patient and/or surrogate as well as nursing, discussions with consultants, evaluation of patient's response to treatment, examination of patient, obtaining history from patient or surrogate, ordering and performing treatments and interventions, ordering and review of laboratory studies, ordering and review of radiographic studies, pulse oximetry and re-evaluation of patient's condition.  Medications Ordered in ED Medications  sodium chloride 0.9 % bolus 1,000 mL (0 mLs Intravenous Stopped 07/15/16 1350)  diltiazem (CARDIZEM) 100 mg in dextrose 5 % 100 mL (1 mg/mL) infusion (7.5 mg/hr Intravenous Rate/Dose Change 07/15/16 1451)  0.9 %  sodium chloride infusion ( Intravenous New Bag/Given 07/15/16 1406)  iopamidol (ISOVUE-300) 61 % injection (75 mLs  Contrast Given 07/15/16 1559)     Initial Impression / Assessment and Plan / ED Course  I have reviewed the triage vital signs and the nursing notes.  Pertinent labs & imaging results that were available during my  care of the patient were reviewed by me and considered in my medical decision making (see chart for details).  Clinical Course     Presenting from GI clinic at [facility] with several days of abdominal pain and diarrhea, and now with atrial fibrillation with RVR for about 48 hours. EKG confirms afib w/ RVR, and he is normotensive and in no acute distress. Afib likely reaction to dehydration and diarrhea. Blood work concerning for AKI, hemoconcentration of CBC. With troponin elevation of 0.09, felt likely due to demand from dehydration and afib w/ rvr. Still persistently in A. fib with RVR despite IV fluids, and subsequently placed on diltiazem drip for rate control.  Concern for diverticulitis vs colitis vs pyelonephritis vs c diff colitis vs other infectious intrabdominal process.  CT abdomen pelvis visualized and shows evidence of colitis and proctitis without diverticulitis. Evidence of mass in the adrenal gland concerning for metastatic disease. At this time still pending C. difficile study. Discussed with the family medicine service and admitted for ongoing management.   Final Clinical Impressions(s) / ED Diagnoses   Final diagnoses:  Atrial fibrillation with RVR ([place])  Acute kidney injury ([place])  Elevated troponin  Dehydration  Hepatocellular carcinoma (HCC)  Diarrhea, unspecified type    New Prescriptions New Prescriptions   No medications on file     Forde Dandy, MD 07/15/16 1713

## 2016-07-15 NOTE — H&P (Signed)
[facility] Admission History and Physical Service Pager: [phone]  Patient name: LIO WEHRLY Medical record number: [id number] Date of birth: [birth year] Age: 82 y.o. Gender: male  Primary Care Provider: [name], MD Consultants: Cardiology  Code Status: Full   Chief Complaint: tachycardic  Assessment and Plan: MARQ REBELLO is a 82 y.o. male presenting with afib with RVR . PMH is significant for hx of afib, cirrhosis of liver due to hepatitis C, hepatocellular carcinoma, laminectomy (November 3rd, 2017), Dilated ascending aortic (4 cm) aneurysm   Afib with RVR: Tachycardic 120-130 with Afib noted, per has felt palpations for about two days. EKG with Afib. Troponin 0.09, likely 2/2 demand ischemia. Outpatient followed by cardiologist Dorris Carnes with a-flutter noted in 2014, hospitalized in 2015 for Afib with RVR received ablation. Received only anticoagulation for about 4 weeks in the past, however not in over 2 years. CHADVasc score 2 per age.  - Admit to stepdown attending Dr. Mingo Amber for Dilt drip  - Started on Diltiazem drip 5-15 ml/hr titration  - Consult cardiology - no anticoagulation for now  - Will continue to trend tropoinin  - AM EKG  - ECHO pending  - Vitals per floor and pulse ox - Telemetry   Diarrhea: Two weeks of diarrhea. Seen by GI today, however was tachycardic therefore sent to ED.  Lipase 18.Total bilirubin elevated 4.1. WBC 13.7 slightly elevated.  Does have hx of coil embolization due to large pelvic hematoma after being trampled by a cow. CT abdomen showing colitis/ proctitis without bowel obstruction likely infectious. No recent antibiotics  Making C. Diff less likely, could consider other infectious cause. - C. Diff pending  - Lactoferrin and stool culture  - Ciprofloxacin 500 mg BID and Flagyl 500 mg q6h - CBC and BMET in the AM  -D5 1/2 NS @ 125 ml per hour - Strict I and Os   - Could consider Giardia due to  patient immunocompromised status   AKI: Scr 1.47 Baseline SCr 1.0  -  Avoid nephrotoxic agents  -  Fluid resuscitation  -  AM BMET   Hepatocellular Carcinoma: Medical oncologist Dr. Shirleen Schirmer. Not receiving in any chemotherapy treatment currently. Recent Laminectomy/Decompression in November 11/2 due to L3 metastasis. CT abdomen/pelvis with New 2 x 3.2 cm LEFT adrenal mass, with thickened RIGHT adrenal gland, in the setting of cancer this is most compatible with new metastasis fell. Abnormal soft tissue about the celiac axis most compatible with pathologic lymphadenopathy. - Continue following   Aortic Aneurysm (4 cm) noted in 2014 - Would consider outpatient follow up abdominal ultrasound to monitor status   Cirrhosis 2/2 Hepatitis C; Transaminates wnl; total bilirubin elevated; Albumin 2.5 - INR to check on coagulation levels   FEN/GI: NPO Prophylaxis: Lovenox   Disposition: Home   History of Present Illness:  KENSHAWN MACIOLEK is a 82 y.o. male presenting palpitations for 2 days. Patient has been having diarrhea for about 2 weeks. He states that this diarrhea as been at max 14 bowel movements in one day, however it has since dropped off. More recently, patient is now only having 3-4 bowel movements a day. He denies any blood in his stools or them being tarry black. He denies any nausea or vomiting. Denies any sick contacts. He denies having any abdominal pain associated with these bowel movements. However states that sometimes when he is having the bowel movements his abdominal muscles are sore. Denies any fevers or chills. Does indicate  that he has had decrease fluid intake and appetite.  She noted that yesterday he had increasing palpitations. Today he went to see his medical oncologist in [facility] and was found to be tachycardic. Patient has had tachycardia in the past and states that he sees  [facility] cardiology. He indicates that he has had a cardioversion in the past he denies having  any anticoagulation per cardiology recommendations.   Review Of Systems: Per HPI with the following additions:  Review of Systems  Constitutional: Negative for chills and fever.  HENT: Negative for congestion.   Respiratory: Negative for shortness of breath.   Cardiovascular: Negative for chest pain and leg swelling.  Gastrointestinal: Positive for diarrhea. Negative for abdominal pain, nausea and vomiting.  Genitourinary: Negative for dysuria and frequency.  Musculoskeletal: Negative for back pain.  Neurological: Positive for weakness. Negative for dizziness and headaches.    Patient Active Problem List   Diagnosis Date Noted  . Atrial fibrillation with RVR ([place]) 07/15/2016  . Blunt abdominal trauma 11/18/2013  . Thrombocytopenia ([place]) 11/18/2013  . Atrial fibrillation ([place]) 11/18/2013  . Anticoagulated on Coumadin 11/18/2013  . Hypovolemic shock ([place]) 11/18/2013  . Acute blood loss anemia 11/17/2013  . Pelvic fracture ([place]) 11/15/2013  . Cirrhosis of liver due to hepatitis C 10/05/2012  . Arrhythmia 12/23/2010    Past Medical History: Past Medical History:  Diagnosis Date  . Ascending aorta dilation (HCC)    4 cm by chest CT 2/14  . Atrial flutter ([place])    09/2012 s/p TEE/DCCV  . CAD (coronary artery disease)    coronary calcification by chest CT 09/2012  . GERD (gastroesophageal reflux disease)   . [facility] (hepatocellular carcinoma) ([place])    s/p ablation, with extension to Abdominal/chest wall  . Hepatitis C   . History of tobacco abuse    quit 1972  . Liver cirrhosis ([place])   . Nephrolithiasis   . Shingles   . Skin cancer     Past Surgical History: Past Surgical History:  Procedure Laterality Date  . ABLATION  2012   liver  . CARDIOVERSION N/A 10/04/2012   Procedure: CARDIOVERSION;  Surgeon: Peter M Martinique, MD;  Location: [facility] ENDOSCOPY;  Service: Cardiovascular;  Laterality: N/A;  . LITHOTRIPSY    . SKIN CANCER EXCISION    . TEE WITHOUT CARDIOVERSION N/A  10/04/2012   Procedure: TRANSESOPHAGEAL ECHOCARDIOGRAM (TEE);  Surgeon: Peter M Martinique, MD;  Location[facility] ENDOSCOPY;  Service: Cardiovascular;  Laterality: N/A;    Social History: Social History  Substance Use Topics  . Smoking status: Former Smoker    Types: Cigarettes    Quit date: 08/15/1970  . Smokeless tobacco: Not on file  . Alcohol use 1.8 oz/week    3 Cans of beer per week     Comment: occasionally   Additional social history:  Please also refer to relevant sections of EMR.  Family History: Family History  Problem Relation Age of Onset  . Leukemia Father     Allergies and Medications: Allergies  Allergen Reactions  . Penicillins Other (See Comments) and Dermatitis    Skin peeled from inside of mouth unknown   No current facility-administered medications on file prior to encounter.    Current Outpatient Prescriptions on File Prior to Encounter  Medication Sig Dispense Refill  . naproxen sodium (ANAPROX) 220 MG tablet Take 220 mg by mouth daily as needed (for pain).      Objective: BP 127/70   Pulse 117   Temp 98  F (36.7 C) (Oral)   Resp 25   SpO2 96%  Exam: General: Elderly gentleman, NAD Eyes: PERLLA, EOMI ENTM: Moist mucous membranes, nonerythematous pharynx Neck: Lymphadenopathy noted, Cardiovascular: Irregular rate, no murmurs, 2+ radial pulses Respiratory: CTA B, no increased work of breathing Gastrointestinal: BS+, ttp throughout  MSK: No lower extremity edema, Normal ROM Derm: No rashes or ulcers Neuro: Alert and orientated x2 (unable to give date, but knew that it was Friday), 5/5 upper and lower extremities  Psych: Psych:  Cognition and judgment appear intact.    Labs and Imaging: CBC BMET   Recent Labs Lab 07/15/16 1256  WBC 13.7*  HGB 12.0*  HCT 36.2*  PLT 133*    Recent Labs Lab 07/15/16 1256  NA 137  K 3.6  CL 105  CO2 20*  BUN 24*  CREATININE 1.47*  GLUCOSE 211*  CALCIUM 8.9     Ct Abdomen Pelvis W  Contrast  Result Date: 07/15/2016 CLINICAL DATA:  Low back pain near hips for 2 weeks. History of cirrhosis and metastatic hepatocellular carcinoma with L3 metastasis. EXAM: CT ABDOMEN AND PELVIS WITH CONTRAST TECHNIQUE: Multidetector CT imaging of the abdomen and pelvis was performed using the standard protocol following bolus administration of intravenous contrast. CONTRAST:  52m ISOVUE-300 IOPAMIDOL (ISOVUE-300) INJECTION 61% COMPARISON:  MRI of liver August 31, 2015 and MRI of the lumbar spine August 13, 2015 and CTA aorta June 04, 2014. Of the of an CT abdomen and pelvis November 15, 2013 FINDINGS: LOWER CHEST: Lung bases are clear. The heart is mildly enlarged. Mild coronary artery calcifications. No pericardial effusions. HEPATOBILIARY: 10 x 15 mm lesion segment 5/6 is stable to slightly smaller than prior MRI consistent with treated neoplasm. Multiple hepatic granulomas. Lobular liver contour with overall small liver consistent with cirrhosis. Enlarged, patent main portal vein with recannulized periumbilical vein. PANCREAS: Stable 1.7 x 2.3 cm cystic lesion body of the pancreas. SPLEEN: Worsening splenomegaly. ADRENALS/URINARY TRACT: Kidneys are orthotopic, demonstrating symmetric enhancement. No nephrolithiasis, hydronephrosis or solid renal masses. Multiple benign appearing renal cysts measure up to 5.5 cm on the LEFT. The unopacified ureters are normal in course and caliber. Delayed imaging through the kidneys demonstrates symmetric prompt contrast excretion within the proximal urinary collecting system. Urinary bladder is partially distended and unremarkable. 2 x 3.2 cm LEFT adrenal mass. Thickened RIGHT adrenal gland. STOMACH/BOWEL: Thickened rectum with perirectal fat stranding. Mild diffuse colonic wall thickening, pericolonic fat stranding. Normal appendix. No bowel obstruction. VASCULAR/LYMPHATIC: Aortoiliac vessels are mildly ectatic with moderate calcific atherosclerosis. Matted abnormal soft  tissue about the celiac axis. REPRODUCTIVE: Mild prostatomegaly with coarse calcifications. OTHER: Trace free fluid in LEFT pericolic gutter. No abscess or drainable fluid collections. No intraperitoneal free air. MUSCULOSKELETAL: 2.4 cm RIGHT chest wall intercostal mass corresponding to metastatic deposit on prior MRI. Metallic foreign body RIGHT pelvic wall suggest coil embolic material. Chronic deformity RIGHT iliac bone. Moderate degenerative change of the LEFT hip. Lytic lesion with expansile soft tissue component LEFT L3 vertebral body extending to the spinal canal, status post posterior decompression at this level. Tumor completely effaces the LEFT L3-4 neural foramen. IMPRESSION: Colitis/ proctitis without bowel obstruction. Findings of portal hypertension and cirrhosis with stable to smaller treated hepatic lesion. New 2 x 3.2 cm LEFT adrenal mass, with thickened RIGHT adrenal gland, in the setting of cancer this is most compatible with new metastasis fell. Abnormal soft tissue about the celiac axis most compatible with pathologic lymphadenopathy. Progressed L3 metastasis, status post interval posterior decompression. Severe  LEFT L3-4 neural foraminal narrowing. Stable RIGHT chest wall metastasis. Electronically Signed   By: Elon Alas M.D.   On: 07/15/2016 16:39   Dg Chest Portable 1 View  Result Date: 07/15/2016 CLINICAL DATA:  Shortness of Breath EXAM: PORTABLE CHEST 1 VIEW COMPARISON:  None. FINDINGS: Cardiac shadow is at the upper limits of normal in size. Prominence of mediastinum is noted related to the portable technique. The lungs are well-aerated without focal infiltrate or sizable effusion. No acute bony abnormality is seen. IMPRESSION: No active disease. Electronically Signed   By: Inez Catalina M.D.   On: 07/15/2016 15:28    Kamaiyah Uselton Cletis Media, MD 07/15/2016, 5:24 PM PGY-2, [place] Intern pager: [phone], text pages welcome

## 2016-07-15 NOTE — Consult Note (Signed)
CARDIOLOGY CONSULT NOTE     Patient ID: Lee Allen MRN: [id number] DOB/AGE: 80/01/1925 80 y.o.  Admit date: 07/15/2016 Referring Physician Brantley Stage MD Primary Physician [name], MD Primary Cardiologist Dorris Carnes MD Reason for Consultation atrial fibrillation  HPI: 82 yo WM with history of Hepatitis C, cirrhosis, and hepatocellular CA presents with atrial fibrillation. He states he has a diarrheal illness for the last 9 days. No abdominal pain, N/V. No dysuria. No recent antibiotic use.  Has decreased appetite and low grade fever. Over last 2 days has noted symptoms of heart racing and palpitations associated with SOB but no chest pain or dizziness. Seen by GI today who noted him to be in Afib with RVR. Sent to ED. He has a history of atrial flutter in 2014 and underwent TEE guided DCCV. Only anticoagulated for 4 weeks then discontinued. Patient denies any recurrent until 2 days ago. TEE at that time showed mild LAE with normal valve function and normal EF. Denies any edema. He has a mild thoracic aneurysm 4 cm and has seen Dr Cyndia Bent in the past. He did have a large pelvic hematoma following trauma in 4/15 after being stomped on by a cow and required coil embolization.   Past Medical History:  Diagnosis Date  . Ascending aorta dilation (HCC)    4 cm by chest CT 2/14  . Atrial flutter ([place])    09/2012 s/p TEE/DCCV  . CAD (coronary artery disease)    coronary calcification by chest CT 09/2012  . GERD (gastroesophageal reflux disease)   . [place] (hepatocellular carcinoma) ([place])    s/p ablation, with extension to Abdominal/chest wall  . Hepatitis C   . History of tobacco abuse    quit 1972  . Liver cirrhosis ([place])   . Nephrolithiasis   . Shingles   . Skin cancer     Family History  Problem Relation Age of Onset  . Leukemia Father     Social History   Social History  . Marital status: Married    Spouse name: N/A  . Number of children: N/A  . Years of education: N/A     Occupational History  . Not on file.   Social History Main Topics  . Smoking status: Former Smoker    Types: Cigarettes    Quit date: 08/15/1970  . Smokeless tobacco: Not on file  . Alcohol use 1.8 oz/week    3 Cans of beer per week     Comment: occasionally  . Drug use: No  . Sexual activity: Not on file   Other Topics Concern  . Not on file   Social History Narrative  . No narrative on file    Past Surgical History:  Procedure Laterality Date  . ABLATION  2012   liver  . CARDIOVERSION N/A 10/04/2012   Procedure: CARDIOVERSION;  Surgeon: Rmani Kellogg M Martinique, MD;  Location[facility] ENDOSCOPY;  Service: Cardiovascular;  Laterality: N/A;  . LITHOTRIPSY    . SKIN CANCER EXCISION    . TEE WITHOUT CARDIOVERSION N/A 10/04/2012   Procedure: TRANSESOPHAGEAL ECHOCARDIOGRAM (TEE);  Surgeon: Kadince Boxley M Martinique, MD;  Location: [facility] ENDOSCOPY;  Service: Cardiovascular;  Laterality: N/A;    Medication: ASA 81 mg daily, Anaprox prn, neurontin 600 mg bid.   ROS: As noted in HPI. All other systems are reviewed and are negative unless otherwise mentioned.   Physical Exam: Blood pressure 104/68, pulse 82, temperature 98 F (36.7 C), temperature source Oral, resp. rate 25, SpO2 96 %.  Current Weight  06/18/14 165 lb (74.8 kg)  01/20/14 165 lb (74.8 kg)  11/15/13 179 lb (81.2 kg)    GENERAL:  Elderly WM NAD HEENT:  PERRL, EOMI, sclera are clear. Oropharynx is clear. NECK:  No jugular venous distention, carotid upstroke brisk and symmetric, no bruits, no thyromegaly or adenopathy LUNGS:  Clear to auscultation bilaterally CHEST:  Unremarkable HEART:  IRRR,  PMI not displaced or sustained,S1 and S2 within normal limits, no S3, no S4: no clicks, no rubs, no murmurs ABD:  Soft, nontender. BS +, no masses or bruits. No hepatomegaly, no splenomegaly EXT:  2 + pulses throughout, no edema, no cyanosis no clubbing SKIN:  Warm and dry.  No rashes NEURO:  Alert and oriented x 3. Cranial nerves II through XII  intact. PSYCH:  Cognitively intact    Labs:   Lab Results  Component Value Date   WBC 13.7 (H) 07/15/2016   HGB 12.0 (L) 07/15/2016   HCT 36.2 (L) 07/15/2016   MCV 94.3 07/15/2016   PLT 133 (L) 07/15/2016    Recent Labs Lab 07/15/16 1256  NA 137  K 3.6  CL 105  CO2 20*  BUN 24*  CREATININE 1.47*  CALCIUM 8.9  PROT 6.1*  BILITOT 4.1*  ALKPHOS 44  ALT 23  AST 39  GLUCOSE 211*   Lab Results  Component Value Date   TROPONINI 0.09 ([place]) 07/15/2016   TROPONINI <0.30 10/03/2012   No results found for: CHOL No results found for: HDL No results found for: LDLCALC No results found for: TRIG No results found for: CHOLHDL No results found for: LDLDIRECT  Lab Results  Component Value Date   PROBNP 41.1 07/07/2007   Lab Results  Component Value Date   TSH 2.36 01/20/2014   No results found for: HGBA1C  Radiology: Ct Abdomen Pelvis W Contrast  Result Date: 07/15/2016 CLINICAL DATA:  Low back pain near hips for 2 weeks. History of cirrhosis and metastatic hepatocellular carcinoma with L3 metastasis. EXAM: CT ABDOMEN AND PELVIS WITH CONTRAST TECHNIQUE: Multidetector CT imaging of the abdomen and pelvis was performed using the standard protocol following bolus administration of intravenous contrast. CONTRAST:  64m ISOVUE-300 IOPAMIDOL (ISOVUE-300) INJECTION 61% COMPARISON:  MRI of liver August 31, 2015 and MRI of the lumbar spine August 13, 2015 and CTA aorta June 04, 2014. Of the of an CT abdomen and pelvis November 15, 2013 FINDINGS: LOWER CHEST: Lung bases are clear. The heart is mildly enlarged. Mild coronary artery calcifications. No pericardial effusions. HEPATOBILIARY: 10 x 15 mm lesion segment 5/6 is stable to slightly smaller than prior MRI consistent with treated neoplasm. Multiple hepatic granulomas. Lobular liver contour with overall small liver consistent with cirrhosis. Enlarged, patent main portal vein with recannulized periumbilical vein. PANCREAS: Stable 1.7 x  2.3 cm cystic lesion body of the pancreas. SPLEEN: Worsening splenomegaly. ADRENALS/URINARY TRACT: Kidneys are orthotopic, demonstrating symmetric enhancement. No nephrolithiasis, hydronephrosis or solid renal masses. Multiple benign appearing renal cysts measure up to 5.5 cm on the LEFT. The unopacified ureters are normal in course and caliber. Delayed imaging through the kidneys demonstrates symmetric prompt contrast excretion within the proximal urinary collecting system. Urinary bladder is partially distended and unremarkable. 2 x 3.2 cm LEFT adrenal mass. Thickened RIGHT adrenal gland. STOMACH/BOWEL: Thickened rectum with perirectal fat stranding. Mild diffuse colonic wall thickening, pericolonic fat stranding. Normal appendix. No bowel obstruction. VASCULAR/LYMPHATIC: Aortoiliac vessels are mildly ectatic with moderate calcific atherosclerosis. Matted abnormal soft tissue about the celiac axis. REPRODUCTIVE:  Mild prostatomegaly with coarse calcifications. OTHER: Trace free fluid in LEFT pericolic gutter. No abscess or drainable fluid collections. No intraperitoneal free air. MUSCULOSKELETAL: 2.4 cm RIGHT chest wall intercostal mass corresponding to metastatic deposit on prior MRI. Metallic foreign body RIGHT pelvic wall suggest coil embolic material. Chronic deformity RIGHT iliac bone. Moderate degenerative change of the LEFT hip. Lytic lesion with expansile soft tissue component LEFT L3 vertebral body extending to the spinal canal, status post posterior decompression at this level. Tumor completely effaces the LEFT L3-4 neural foramen. IMPRESSION: Colitis/ proctitis without bowel obstruction. Findings of portal hypertension and cirrhosis with stable to smaller treated hepatic lesion. New 2 x 3.2 cm LEFT adrenal mass, with thickened RIGHT adrenal gland, in the setting of cancer this is most compatible with new metastasis fell. Abnormal soft tissue about the celiac axis most compatible with pathologic  lymphadenopathy. Progressed L3 metastasis, status post interval posterior decompression. Severe LEFT L3-4 neural foraminal narrowing. Stable RIGHT chest wall metastasis. Electronically Signed   By: Elon Alas M.D.   On: 07/15/2016 16:39   Dg Chest Portable 1 View  Result Date: 07/15/2016 CLINICAL DATA:  Shortness of Breath EXAM: PORTABLE CHEST 1 VIEW COMPARISON:  None. FINDINGS: Cardiac shadow is at the upper limits of normal in size. Prominence of mediastinum is noted related to the portable technique. The lungs are well-aerated without focal infiltrate or sizable effusion. No acute bony abnormality is seen. IMPRESSION: No active disease. Electronically Signed   By: Inez Catalina M.D.   On: 07/15/2016 15:28    EKG:Afib with RVR, LAD, ST-T wave abnormality c/w lateral ischemia.  ASSESSMENT AND PLAN:  1. Afib with RVR. Most likely related to stress of acute diarrheal illness, colitis, and dehydration. [place] Vasc score of 1 based on age. He is a poor candidate for anticoagulation due to metastatic hepatocellular CA, cirrhosis, portal HTN. Recommend rate control with IV diltiazem for now. Transition to oral meds when stable. Will check Echo. Treat underlying dehydration and diarrhea. 2. Hepatocellular CA with metastatic disease to chest wall, L3, ?adrenal. 3. Hepatic cirrhosis with portal HTN. 4. Diarrheal illness ? Colitis 5. AKI probably due to volume depletion/dehydration 6. Elevated troponin. Most likely due to demand ischemia with AFib RVR and AKI.    Signed: Ernesta Trabert Martinique, [place]  07/15/2016, 6:32 PM

## 2016-07-16 DIAGNOSIS — E860 Dehydration: Secondary | ICD-10-CM

## 2016-07-16 DIAGNOSIS — C220 Liver cell carcinoma: Secondary | ICD-10-CM

## 2016-07-16 DIAGNOSIS — A0472 Enterocolitis due to Clostridium difficile, not specified as recurrent: Secondary | ICD-10-CM

## 2016-07-16 DIAGNOSIS — N179 Acute kidney failure, unspecified: Secondary | ICD-10-CM

## 2016-07-16 LAB — CBC
HCT: 29.6 % — ABNORMAL LOW (ref 39.0–52.0)
Hemoglobin: 10 g/dL — ABNORMAL LOW (ref 13.0–17.0)
MCH: 31.6 pg (ref 26.0–34.0)
MCHC: 33.8 g/dL (ref 30.0–36.0)
MCV: 93.7 fL (ref 78.0–100.0)
PLATELETS: 91 10*3/uL — AB (ref 150–400)
RBC: 3.16 MIL/uL — AB (ref 4.22–5.81)
RDW: 15.2 % (ref 11.5–15.5)
WBC: 9.2 10*3/uL (ref 4.0–10.5)

## 2016-07-16 LAB — COMPREHENSIVE METABOLIC PANEL
ALT: 21 U/L (ref 17–63)
AST: 30 U/L (ref 15–41)
Albumin: 2.4 g/dL — ABNORMAL LOW (ref 3.5–5.0)
Alkaline Phosphatase: 38 U/L (ref 38–126)
Anion gap: 8 (ref 5–15)
BUN: 21 mg/dL — ABNORMAL HIGH (ref 6–20)
CHLORIDE: 110 mmol/L (ref 101–111)
CO2: 21 mmol/L — ABNORMAL LOW (ref 22–32)
CREATININE: 1.26 mg/dL — AB (ref 0.61–1.24)
Calcium: 8.4 mg/dL — ABNORMAL LOW (ref 8.9–10.3)
GFR, EST AFRICAN AMERICAN: 57 mL/min — AB (ref >60)
GFR, EST NON AFRICAN AMERICAN: 49 mL/min — AB (ref >60)
Glucose, Bld: 135 mg/dL — ABNORMAL HIGH (ref 65–99)
Potassium: 2.9 mmol/L — ABNORMAL LOW (ref 3.5–5.1)
Sodium: 139 mmol/L (ref 135–145)
Total Bilirubin: 2.8 mg/dL — ABNORMAL HIGH (ref 0.3–1.2)
Total Protein: 5.5 g/dL — ABNORMAL LOW (ref 6.5–8.1)

## 2016-07-16 LAB — BASIC METABOLIC PANEL
ANION GAP: 7 (ref 5–15)
BUN: 20 mg/dL (ref 6–20)
CHLORIDE: 106 mmol/L (ref 101–111)
CO2: 19 mmol/L — AB (ref 22–32)
Calcium: 8.3 mg/dL — ABNORMAL LOW (ref 8.9–10.3)
Creatinine, Ser: 1.21 mg/dL (ref 0.61–1.24)
GFR calc non Af Amer: 51 mL/min — ABNORMAL LOW (ref >60)
GFR, EST AFRICAN AMERICAN: 59 mL/min — AB (ref >60)
Glucose, Bld: 145 mg/dL — ABNORMAL HIGH (ref 65–99)
POTASSIUM: 2.9 mmol/L — AB (ref 3.5–5.1)
Sodium: 132 mmol/L — ABNORMAL LOW (ref 135–145)

## 2016-07-16 LAB — TROPONIN I: Troponin I: 0.03 ng/mL (ref <0.03)

## 2016-07-16 LAB — PROTIME-INR
INR: 1.45
Prothrombin Time: 17.7 seconds — ABNORMAL HIGH (ref 11.4–15.2)

## 2016-07-16 LAB — MRSA PCR SCREENING: MRSA by PCR: NEGATIVE

## 2016-07-16 MED ORDER — POTASSIUM CHLORIDE CRYS ER 20 MEQ PO TBCR
40.0000 meq | EXTENDED_RELEASE_TABLET | Freq: Two times a day (BID) | ORAL | Status: DC
  Administered 2016-07-16: 40 meq via ORAL
  Filled 2016-07-16: qty 2

## 2016-07-16 MED ORDER — POTASSIUM CHLORIDE CRYS ER 20 MEQ PO TBCR: 40.0000 meq | EXTENDED_RELEASE_TABLET | Freq: Two times a day (BID) | ORAL | Status: DC

## 2016-07-16 MED ORDER — DEXTROSE 5 % IV SOLN
INTRAVENOUS | Status: AC
  Filled 2016-07-16: qty 100

## 2016-07-16 MED ORDER — DILTIAZEM HCL 100 MG IV SOLR
5.0000 mg/h | INTRAVENOUS | Status: DC
  Administered 2016-07-16 – 2016-07-19 (×11): 15 mg/h via INTRAVENOUS
  Filled 2016-07-16 (×12): qty 100

## 2016-07-16 MED ORDER — VANCOMYCIN 50 MG/ML ORAL SOLUTION
125.0000 mg | Freq: Four times a day (QID) | ORAL | Status: DC
  Administered 2016-07-16 – 2016-07-23 (×29): 125 mg via ORAL
  Filled 2016-07-16 (×31): qty 2.5

## 2016-07-16 MED ORDER — DEXTROSE-NACL 5-0.45 % IV SOLN
INTRAVENOUS | Status: DC
  Administered 2016-07-16 (×3): via INTRAVENOUS

## 2016-07-16 MED ORDER — SODIUM CHLORIDE 0.9 % IV SOLN
INTRAVENOUS | Status: AC
  Administered 2016-07-16: 23:00:00 via INTRAVENOUS
  Filled 2016-07-16: qty 1000

## 2016-07-16 NOTE — Progress Notes (Signed)
At approximately 2030 pt was found sitting on the floor after attempting to get up to bedside commode without assistance. Patient's left elbow was bleeding from a skin tear. Skin tear was cleaned and a foam dressing was applied. Pt's vital signs BP 105/66 HR 142 Resp 23. MD and family notified. No further interventions at this time.

## 2016-07-16 NOTE — Progress Notes (Signed)
Family Medicine Teaching Service Daily Progress Note Intern Pager: [phone]  Patient name: Lee Allen Medical record number: [id number] Date of birth: 1924/12/25 Age: 82 y.o. Gender: male  Primary Care Provider: [name], MD Consultants: Cardiology  Code Status: Full   Pt Overview and Major Events to Date:   Assessment and Plan: GURJOT BRISCO is a 81 y.o. male presenting with afib with RVR . PMH is significant for hx of afib, cirrhosis of liver due to hepatitis C, hepatocellular carcinoma, laminectomy (November 3rd, 2017), Dilated ascending aortic (4 cm) aneurysm   Afib with RVR: Tachycardia improved 114-100 noted. CHADVasc score 2 per age.  - Continue Diltiazem drip 5-15 ml/hr titration  - Consult cardiology - no anticoagulation for now  - Troponin 0.09> 0.03> 0.03>0.03 - AM EKG pending  - ECHO pending  - Vitals per floor and pulse ox - Telemetry   C.Diff Colitis: CT positive for colitis and proctitis. Found to be positive for C.diff  - Lactoferrin and stool culture  - Vancomycin 125 mg q6hrs PO  - CBC and BMET in the AM  -D5 1/2 NS @ 125 ml per hour - Strict I and Os    Hypokalemia: K 2.9  - Replete with KDUR 40 BID  - BMET at 2 PM   AKI: Scr 1.47 >1.26 Baseline SCr 1.0; improving with fluid resuscitation  -  Avoid nephrotoxic agents  -  Fluid resuscitation  -  BMET in the AM    Hepatocellular Carcinoma: Medical oncologist Dr. Shirleen Schirmer. Not receiving in any chemotherapy treatment currently. Recent Laminectomy/Decompression in November 11/2 due to L3 metastasis. CT abdomen/pelvis with New 2 x 3.2 cm LEFT adrenal mass, with thickened RIGHT adrenal gland, in the setting of cancer this is most compatible with new metastasis fell. Abnormal soft tissue about the celiac axis most compatible with pathologic lymphadenopathy. - Continue following   Aortic Aneurysm (4 cm) noted in 2014 - Would consider outpatient follow up abdominal ultrasound to monitor  status   Cirrhosis 2/2 Hepatitis C; Transaminates wnl; total bilirubin elevated; Albumin 2.5. INR 1.45/ PT 17.7.  Thrombocytopenia: Platelets 133> 91. Likely drop due to patient being hemo-concentrated on admission  - Will continue to follow closely   FEN/GI: NPO Prophylaxis: Lovenox   Disposition: Home   Subjective:  Patient doing well this morning. States he feels a lot better. Patient did get up out bed to go to bathroom and fell down. He bumped his left elbow and the back of his head against the wall.   Objective: Temp:  [98 F (36.7 C)-98.7 F (37.1 C)] 98.5 F (36.9 C) (12/02 0754) Pulse Rate:  [67-157] 99 (12/02 0754) Resp:  [20-28] 24 (12/02 0754) BP: (102-127)/(52-86) 105/61 (12/02 0754) SpO2:  [92 %-100 %] 95 % (12/02 0754) Weight:  [167 lb (75.8 kg)-171 lb 3.2 oz (77.7 kg)] 167 lb (75.8 kg) (12/02 0400) Physical Exam: General: Well appearing elderly gentlemen Cardiovascular: Irregular rate, no murmurs  Respiratory: CTAB, no rhonchi  Abdomen: BS+, Still diffusely tender throughout Extremities: No lower extremity edema.   Laboratory:  Recent Labs Lab 07/15/16 1256 07/16/16 0207  WBC 13.7* 9.2  HGB 12.0* 10.0*  HCT 36.2* 29.6*  PLT 133* 91*    Recent Labs Lab 07/15/16 1256 07/16/16 0207  NA 137 139  K 3.6 2.9*  CL 105 110  CO2 20* 21*  BUN 24* 21*  CREATININE 1.47* 1.26*  CALCIUM 8.9 8.4*  PROT 6.1* 5.5*  BILITOT 4.1* 2.8*  ALKPHOS 44  38  ALT 23 21  AST 39 30  GLUCOSE 211* 135*    Imaging/Diagnostic Tests: Ct Abdomen Pelvis W Contrast  Result Date: 07/15/2016 CLINICAL DATA:  Low back pain near hips for 2 weeks. History of cirrhosis and metastatic hepatocellular carcinoma with L3 metastasis. EXAM: CT ABDOMEN AND PELVIS WITH CONTRAST TECHNIQUE: Multidetector CT imaging of the abdomen and pelvis was performed using the standard protocol following bolus administration of intravenous contrast. CONTRAST:  58m ISOVUE-300 IOPAMIDOL (ISOVUE-300)  INJECTION 61% COMPARISON:  MRI of liver August 31, 2015 and MRI of the lumbar spine August 13, 2015 and CTA aorta June 04, 2014. Of the of an CT abdomen and pelvis November 15, 2013 FINDINGS: LOWER CHEST: Lung bases are clear. The heart is mildly enlarged. Mild coronary artery calcifications. No pericardial effusions. HEPATOBILIARY: 10 x 15 mm lesion segment 5/6 is stable to slightly smaller than prior MRI consistent with treated neoplasm. Multiple hepatic granulomas. Lobular liver contour with overall small liver consistent with cirrhosis. Enlarged, patent main portal vein with recannulized periumbilical vein. PANCREAS: Stable 1.7 x 2.3 cm cystic lesion body of the pancreas. SPLEEN: Worsening splenomegaly. ADRENALS/URINARY TRACT: Kidneys are orthotopic, demonstrating symmetric enhancement. No nephrolithiasis, hydronephrosis or solid renal masses. Multiple benign appearing renal cysts measure up to 5.5 cm on the LEFT. The unopacified ureters are normal in course and caliber. Delayed imaging through the kidneys demonstrates symmetric prompt contrast excretion within the proximal urinary collecting system. Urinary bladder is partially distended and unremarkable. 2 x 3.2 cm LEFT adrenal mass. Thickened RIGHT adrenal gland. STOMACH/BOWEL: Thickened rectum with perirectal fat stranding. Mild diffuse colonic wall thickening, pericolonic fat stranding. Normal appendix. No bowel obstruction. VASCULAR/LYMPHATIC: Aortoiliac vessels are mildly ectatic with moderate calcific atherosclerosis. Matted abnormal soft tissue about the celiac axis. REPRODUCTIVE: Mild prostatomegaly with coarse calcifications. OTHER: Trace free fluid in LEFT pericolic gutter. No abscess or drainable fluid collections. No intraperitoneal free air. MUSCULOSKELETAL: 2.4 cm RIGHT chest wall intercostal mass corresponding to metastatic deposit on prior MRI. Metallic foreign body RIGHT pelvic wall suggest coil embolic material. Chronic deformity RIGHT  iliac bone. Moderate degenerative change of the LEFT hip. Lytic lesion with expansile soft tissue component LEFT L3 vertebral body extending to the spinal canal, status post posterior decompression at this level. Tumor completely effaces the LEFT L3-4 neural foramen. IMPRESSION: Colitis/ proctitis without bowel obstruction. Findings of portal hypertension and cirrhosis with stable to smaller treated hepatic lesion. New 2 x 3.2 cm LEFT adrenal mass, with thickened RIGHT adrenal gland, in the setting of cancer this is most compatible with new metastasis fell. Abnormal soft tissue about the celiac axis most compatible with pathologic lymphadenopathy. Progressed L3 metastasis, status post interval posterior decompression. Severe LEFT L3-4 neural foraminal narrowing. Stable RIGHT chest wall metastasis. Electronically Signed   By: CElon AlasM.D.   On: 07/15/2016 16:39   Dg Chest Portable 1 View  Result Date: 07/15/2016 CLINICAL DATA:  Shortness of Breath EXAM: PORTABLE CHEST 1 VIEW COMPARISON:  None. FINDINGS: Cardiac shadow is at the upper limits of normal in size. Prominence of mediastinum is noted related to the portable technique. The lungs are well-aerated without focal infiltrate or sizable effusion. No acute bony abnormality is seen. IMPRESSION: No active disease. Electronically Signed   By: MInez CatalinaM.D.   On: 07/15/2016 15:28    Aalayah Riles ZCletis Media MD 07/16/2016, 9:19 AM PGY-2, CP[facility] MarIntern pager: [phone] text pages welcome

## 2016-07-17 ENCOUNTER — Inpatient Hospital Stay (HOSPITAL_COMMUNITY): Payer: Medicare Other

## 2016-07-17 DIAGNOSIS — I4891 Unspecified atrial fibrillation: Secondary | ICD-10-CM

## 2016-07-17 DIAGNOSIS — E876 Hypokalemia: Secondary | ICD-10-CM

## 2016-07-17 DIAGNOSIS — A0472 Enterocolitis due to Clostridium difficile, not specified as recurrent: Secondary | ICD-10-CM

## 2016-07-17 LAB — ECHOCARDIOGRAM COMPLETE
AVLVOTPG: 8 mmHg
CHL CUP DOP CALC LVOT VTI: 23.1 cm
CHL CUP RV SYS PRESS: 36 mmHg
CHL CUP TV REG PEAK VELOCITY: 264 cm/s
FS: 34 % (ref 28–44)
Height: 67.5 in
IV/PV OW: 1.23
LA diam index: 2.5 cm/m2
LA vol: 94 mL
LASIZE: 49 mm
LAVOLA4C: 82.9 mL
LAVOLIN: 47.9 mL/m2
LEFT ATRIUM END SYS DIAM: 49 mm
LV TDI E'LATERAL: 13.3
LVELAT: 13.3 cm/s
LVOT area: 4.52 cm2
LVOT diameter: 24 mm
LVOT peak vel: 142 cm/s
LVOTSV: 104 mL
PW: 12.8 mm — AB (ref 0.6–1.1)
RV LATERAL S' VELOCITY: 15.9 cm/s
TAPSE: 15.1 mm
TDI e' medial: 8.92
TRMAXVEL: 264 cm/s
Weight: 2803.2 oz

## 2016-07-17 LAB — CBC
HCT: 29.2 % — ABNORMAL LOW (ref 39.0–52.0)
Hemoglobin: 9.9 g/dL — ABNORMAL LOW (ref 13.0–17.0)
MCH: 32.1 pg (ref 26.0–34.0)
MCHC: 33.9 g/dL (ref 30.0–36.0)
MCV: 94.8 fL (ref 78.0–100.0)
PLATELETS: 95 10*3/uL — AB (ref 150–400)
RBC: 3.08 MIL/uL — ABNORMAL LOW (ref 4.22–5.81)
RDW: 15.5 % (ref 11.5–15.5)
WBC: 10.6 10*3/uL — AB (ref 4.0–10.5)

## 2016-07-17 LAB — BASIC METABOLIC PANEL
ANION GAP: 8 (ref 5–15)
BUN: 18 mg/dL (ref 6–20)
CALCIUM: 8.3 mg/dL — AB (ref 8.9–10.3)
CO2: 21 mmol/L — ABNORMAL LOW (ref 22–32)
Chloride: 109 mmol/L (ref 101–111)
Creatinine, Ser: 1.11 mg/dL (ref 0.61–1.24)
GFR, EST NON AFRICAN AMERICAN: 57 mL/min — AB (ref >60)
Glucose, Bld: 129 mg/dL — ABNORMAL HIGH (ref 65–99)
Potassium: 3.2 mmol/L — ABNORMAL LOW (ref 3.5–5.1)
SODIUM: 138 mmol/L (ref 135–145)

## 2016-07-17 LAB — MAGNESIUM: Magnesium: 1.6 mg/dL — ABNORMAL LOW (ref 1.7–2.4)

## 2016-07-17 LAB — PHOSPHORUS: PHOSPHORUS: 2.2 mg/dL — AB (ref 2.5–4.6)

## 2016-07-17 LAB — OCCULT BLOOD X 1 CARD TO LAB, STOOL: FECAL OCCULT BLD: NEGATIVE

## 2016-07-17 MED ORDER — K PHOS MONO-SOD PHOS DI & MONO 155-852-130 MG PO TABS
250.0000 mg | ORAL_TABLET | Freq: Once | ORAL | Status: AC
  Administered 2016-07-17: 250 mg via ORAL
  Filled 2016-07-17: qty 1

## 2016-07-17 MED ORDER — POTASSIUM CHLORIDE CRYS ER 20 MEQ PO TBCR: 40.0000 meq | EXTENDED_RELEASE_TABLET | Freq: Once | ORAL | Status: DC

## 2016-07-17 MED ORDER — DIGOXIN 0.25 MG/ML IJ SOLN
0.2500 mg | Freq: Once | INTRAMUSCULAR | Status: AC
  Administered 2016-07-17: 0.25 mg via INTRAVENOUS
  Filled 2016-07-17: qty 2

## 2016-07-17 MED ORDER — POTASSIUM CHLORIDE CRYS ER 20 MEQ PO TBCR
20.0000 meq | EXTENDED_RELEASE_TABLET | Freq: Once | ORAL | Status: AC
  Administered 2016-07-17: 20 meq via ORAL
  Filled 2016-07-17: qty 1

## 2016-07-17 MED ORDER — POTASSIUM CHLORIDE CRYS ER 20 MEQ PO TBCR: 40.0000 meq | EXTENDED_RELEASE_TABLET | Freq: Every day | ORAL | Status: DC

## 2016-07-17 MED ORDER — MAGNESIUM SULFATE 2 GM/50ML IV SOLN
2.0000 g | Freq: Once | INTRAVENOUS | Status: AC
  Administered 2016-07-17: 2 g via INTRAVENOUS
  Filled 2016-07-17: qty 50

## 2016-07-17 NOTE — Progress Notes (Signed)
Family Medicine Teaching Service Daily Progress Note Intern Pager: [phone]  Patient name: Lee Allen Medical record number: [id number] Date of birth: 1925/01/03 Age: 82 y.o. Gender: male  Primary Care Provider: [name], MD Consultants: Cardiology  Code Status: Full   Pt Overview and Major Events to Date:  12/1- admitted with Afib with RVR and diarrhea (C.diff)  Assessment and Plan: [name] is a 82 y.o. male presenting with afib with RVR . PMH is significant for hx of afib, cirrhosis of liver due to hepatitis C, hepatocellular carcinoma, laminectomy (November 3rd, 2017), Dilated ascending aortic (4 cm) aneurysm   Afib with RVR: Tachycardia improved. CHADVasc score 2 per age. Liekly triggered by C.diff.  - Continue Diltiazem drip 5-15 ml/hr titration; plan to transition to PO medicine for HR control - Cardiology following; appreciate recs  - no anticoagulation for now  - Troponin 0.09> 0.03> 0.03>0.03 - AM EKG pending  - ECHO pending  - Vitals per floor and pulse ox - Telemetry   C.Diff Colitis: CT positive for colitis and proctitis. Found to be positive for C.diff  - Lactoferrin and stool culture  - Vancomycin 125 mg q6hrs PO  - continue D5 1/2 NS @ 125 ml per hour since patient with continued decreased PO - Strict I and Os - PT/OT    Hypokalemia: K 2.9 on admission 3.2 today - Repleted with KDUR 40 BID yesterday - check Mg and Phos  -  Replete as needed  AKI: Scr 1.47 >1.26 Baseline SCr 1.0; improving with fluid resuscitation  -  Avoid nephrotoxic agents  -  Fluid resuscitation  -  BMET in the AM   Anemia. Hbg on admission 12.0; this AM 9.9. Baseline hard to discern but patient has trended between 7-12 over the last 2 years.  - monitor - will collect FOBT   Hepatocellular Carcinoma: Medical oncologist Dr. Shirleen Schirmer. Not receiving in any chemotherapy treatment currently. Recent Laminectomy/Decompression in November 11/2 due to L3  metastasis. CT abdomen/pelvis with New 2 x 3.2 cm LEFT adrenal mass, with thickened RIGHT adrenal gland, in the setting of cancer this is most compatible with new metastasis fell. Abnormal soft tissue about the celiac axis most compatible with pathologic lymphadenopathy. - Continue following   Aortic Aneurysm (4 cm) noted in 2014 - Would consider outpatient follow up abdominal ultrasound to monitor status   Cirrhosis 2/2 Hepatitis C; Transaminates wnl; total bilirubin elevated; Albumin 2.5. INR 1.45/ PT 17.7.  Thrombocytopenia: Platelets 133> 95.  - Will continue to follow closely   FEN/GI: Regular diet Prophylaxis: Lovenox   Disposition: Home   Subjective:  Patient states he feels unwell this morning. Cannot really describe what he feels. He feels weaker than yesterday. Poor PO intake. Continues to have diarrhea and states everything just goes through him. Not endorsing any subjective pain.   Objective: Temp:  [97.6 F (36.4 C)-99.6 F (37.6 C)] 98 F (36.7 C) (12/03 0738) Pulse Rate:  [87-116] 87 (12/03 0738) Resp:  [20-32] 25 (12/03 0738) BP: (108-152)/(65-82) 120/65 (12/03 0738) SpO2:  [90 %-96 %] 94 % (12/03 0738) Weight:  [175 lb 3.2 oz (79.5 kg)] 175 lb 3.2 oz (79.5 kg) (12/03 0139) Physical Exam: General: Ill-appearing elderly gentlemen, lying in bed, NAD Cardiovascular: Irregular rhythm, tachycardia, no murmurs  Respiratory: CTAB, no rhonchi  Abdomen: BS+, soft, some voluntary guarding with palpation, mild tenderness, mild distention  Extremities: No lower extremity edema.   Laboratory:  Recent Labs Lab 07/15/16 1256 07/16/16 0207 07/17/16  0407  WBC 13.7* 9.2 10.6*  HGB 12.0* 10.0* 9.9*  HCT 36.2* 29.6* 29.2*  PLT 133* 91* 95*    Recent Labs Lab 07/15/16 1256 07/16/16 0207 07/16/16 2030 07/17/16 0407  NA 137 139 132* 138  K 3.6 2.9* 2.9* 3.2*  CL 105 110 106 109  CO2 20* 21* 19* 21*  BUN 24* 21* 20 18  CREATININE 1.47* 1.26* 1.21 1.11  CALCIUM  8.9 8.4* 8.3* 8.3*  PROT 6.1* 5.5*  --   --   BILITOT 4.1* 2.8*  --   --   ALKPHOS 44 38  --   --   ALT 23 21  --   --   AST 39 30  --   --   GLUCOSE 211* 135* 145* 129*    Imaging/Diagnostic Tests: Ct Abdomen Pelvis W Contrast  Result Date: 07/15/2016 CLINICAL DATA:  Low back pain near hips for 2 weeks. History of cirrhosis and metastatic hepatocellular carcinoma with L3 metastasis. EXAM: CT ABDOMEN AND PELVIS WITH CONTRAST TECHNIQUE: Multidetector CT imaging of the abdomen and pelvis was performed using the standard protocol following bolus administration of intravenous contrast. CONTRAST:  41m ISOVUE-300 IOPAMIDOL (ISOVUE-300) INJECTION 61% COMPARISON:  MRI of liver August 31, 2015 and MRI of the lumbar spine August 13, 2015 and CTA aorta June 04, 2014. Of the of an CT abdomen and pelvis November 15, 2013 FINDINGS: LOWER CHEST: Lung bases are clear. The heart is mildly enlarged. Mild coronary artery calcifications. No pericardial effusions. HEPATOBILIARY: 10 x 15 mm lesion segment 5/6 is stable to slightly smaller than prior MRI consistent with treated neoplasm. Multiple hepatic granulomas. Lobular liver contour with overall small liver consistent with cirrhosis. Enlarged, patent main portal vein with recannulized periumbilical vein. PANCREAS: Stable 1.7 x 2.3 cm cystic lesion body of the pancreas. SPLEEN: Worsening splenomegaly. ADRENALS/URINARY TRACT: Kidneys are orthotopic, demonstrating symmetric enhancement. No nephrolithiasis, hydronephrosis or solid renal masses. Multiple benign appearing renal cysts measure up to 5.5 cm on the LEFT. The unopacified ureters are normal in course and caliber. Delayed imaging through the kidneys demonstrates symmetric prompt contrast excretion within the proximal urinary collecting system. Urinary bladder is partially distended and unremarkable. 2 x 3.2 cm LEFT adrenal mass. Thickened RIGHT adrenal gland. STOMACH/BOWEL: Thickened rectum with perirectal fat  stranding. Mild diffuse colonic wall thickening, pericolonic fat stranding. Normal appendix. No bowel obstruction. VASCULAR/LYMPHATIC: Aortoiliac vessels are mildly ectatic with moderate calcific atherosclerosis. Matted abnormal soft tissue about the celiac axis. REPRODUCTIVE: Mild prostatomegaly with coarse calcifications. OTHER: Trace free fluid in LEFT pericolic gutter. No abscess or drainable fluid collections. No intraperitoneal free air. MUSCULOSKELETAL: 2.4 cm RIGHT chest wall intercostal mass corresponding to metastatic deposit on prior MRI. Metallic foreign body RIGHT pelvic wall suggest coil embolic material. Chronic deformity RIGHT iliac bone. Moderate degenerative change of the LEFT hip. Lytic lesion with expansile soft tissue component LEFT L3 vertebral body extending to the spinal canal, status post posterior decompression at this level. Tumor completely effaces the LEFT L3-4 neural foramen. IMPRESSION: Colitis/ proctitis without bowel obstruction. Findings of portal hypertension and cirrhosis with stable to smaller treated hepatic lesion. New 2 x 3.2 cm LEFT adrenal mass, with thickened RIGHT adrenal gland, in the setting of cancer this is most compatible with new metastasis fell. Abnormal soft tissue about the celiac axis most compatible with pathologic lymphadenopathy. Progressed L3 metastasis, status post interval posterior decompression. Severe LEFT L3-4 neural foraminal narrowing. Stable RIGHT chest wall metastasis. Electronically Signed   By: CSandie Ano  Bloomer M.D.   On: 07/15/2016 16:39   Dg Chest Portable 1 View  Result Date: 07/15/2016 CLINICAL DATA:  Shortness of Breath EXAM: PORTABLE CHEST 1 VIEW COMPARISON:  None. FINDINGS: Cardiac shadow is at the upper limits of normal in size. Prominence of mediastinum is noted related to the portable technique. The lungs are well-aerated without focal infiltrate or sizable effusion. No acute bony abnormality is seen. IMPRESSION: No active disease.  Electronically Signed   By: Inez Catalina M.D.   On: 07/15/2016 15:28    Katheren Shams, DO 07/17/2016, 8:03 AM PGY-3, [facility] Intern pager: [phone], text pages welcome

## 2016-07-17 NOTE — Progress Notes (Signed)
Patient Name: Lee Allen Date of Encounter: 07/17/2016  Primary Cardiologist: Dorris Carnes MD  Hospital Problem List     Active Problems:   Atrial fibrillation with RVR ([place])   A-fib ([place])   Acute kidney injury ([place])   Dehydration   Hepatocellular carcinoma ([place])   C. difficile colitis     Subjective   I feel terrible. Up going to the bathroom a lot.   Inpatient Medications    Scheduled Meds: . enoxaparin (LOVENOX) injection  40 mg Subcutaneous QHS  . sodium chloride flush  3 mL Intravenous Q12H  . vancomycin  125 mg Oral Q6H   Continuous Infusions: . diltiazem (CARDIZEM) infusion 15 mg/hr (07/17/16 0605)   PRN Meds: cholestyramine   Vital Signs    Vitals:   07/17/16 0139 07/17/16 0325 07/17/16 0351 07/17/16 0738  BP:  113/68  120/65  Pulse:  94  87  Resp:  (!) 27  (!) 25  Temp:   99.6 F (37.6 C) 98 F (36.7 C)  TempSrc:   Oral Oral  SpO2:  95%  94%  Weight: 175 lb 3.2 oz (79.5 kg)     Height:        Intake/Output Summary (Last 24 hours) at 07/17/16 0930 Last data filed at 07/17/16 4920  Gross per 24 hour  Intake          1368.92 ml  Output                0 ml  Net          1368.92 ml   Filed Weights   07/15/16 2100 07/16/16 0400 07/17/16 0139  Weight: 171 lb 3.2 oz (77.7 kg) 167 lb (75.8 kg) 175 lb 3.2 oz (79.5 kg)    Physical Exam  GEN: Well nourished, well developed, in no acute distress.  HEENT: Grossly normal.  Neck: Supple, no JVD, carotid bruits, or masses. Cardiac: IRRR, no murmurs, rubs, or gallops. No clubbing, cyanosis, edema.  Radials/DP/PT 2+ and equal bilaterally.  Respiratory:  Respirations regular and unlabored, clear to auscultation bilaterally. GI: Soft, nontender, nondistended, BS + x 4. MS: no deformity or atrophy. Skin: warm and dry, no rash. Neuro:  Strength and sensation are intact. Psych: AAOx3.  Normal affect.  Labs    CBC  Recent Labs  07/16/16 0207 07/17/16 0407  WBC 9.2 10.6*  HGB 10.0* 9.9*  HCT  29.6* 29.2*  MCV 93.7 94.8  PLT 91* 95*   Basic Metabolic Panel  Recent Labs  07/16/16 2030 07/17/16 0407  NA 132* 138  K 2.9* 3.2*  CL 106 109  CO2 19* 21*  GLUCOSE 145* 129*  BUN 20 18  CREATININE 1.21 1.11  CALCIUM 8.3* 8.3*   Liver Function Tests  Recent Labs  07/15/16 1256 07/16/16 0207  AST 39 30  ALT 23 21  ALKPHOS 44 38  BILITOT 4.1* 2.8*  PROT 6.1* 5.5*  ALBUMIN 2.5* 2.4*    Recent Labs  07/15/16 1256  LIPASE 18   Cardiac Enzymes  Recent Labs  07/15/16 2005 07/16/16 0207 07/16/16 0712  TROPONINI 0.03* 0.03* <0.03    Recent Labs  07/15/16 2005  TSH 2.474    Telemetry    Atrial fib with PVC's  - Personally Reviewed  ECG    Personally Reviewed  Radiology    Ct Abdomen Pelvis W Contrast  Result Date: 07/15/2016 CLINICAL DATA:  Low back pain near hips for 2 weeks. History of cirrhosis and metastatic hepatocellular  carcinoma with L3 metastasis. EXAM: CT ABDOMEN AND PELVIS WITH CONTRAST TECHNIQUE: Multidetector CT imaging of the abdomen and pelvis was performed using the standard protocol following bolus administration of intravenous contrast. CONTRAST:  37m ISOVUE-300 IOPAMIDOL (ISOVUE-300) INJECTION 61% COMPARISON:  MRI of liver August 31, 2015 and MRI of the lumbar spine August 13, 2015 and CTA aorta June 04, 2014. Of the of an CT abdomen and pelvis November 15, 2013 FINDINGS: LOWER CHEST: Lung bases are clear. The heart is mildly enlarged. Mild coronary artery calcifications. No pericardial effusions. HEPATOBILIARY: 10 x 15 mm lesion segment 5/6 is stable to slightly smaller than prior MRI consistent with treated neoplasm. Multiple hepatic granulomas. Lobular liver contour with overall small liver consistent with cirrhosis. Enlarged, patent main portal vein with recannulized periumbilical vein. PANCREAS: Stable 1.7 x 2.3 cm cystic lesion body of the pancreas. SPLEEN: Worsening splenomegaly. ADRENALS/URINARY TRACT: Kidneys are orthotopic,  demonstrating symmetric enhancement. No nephrolithiasis, hydronephrosis or solid renal masses. Multiple benign appearing renal cysts measure up to 5.5 cm on the LEFT. The unopacified ureters are normal in course and caliber. Delayed imaging through the kidneys demonstrates symmetric prompt contrast excretion within the proximal urinary collecting system. Urinary bladder is partially distended and unremarkable. 2 x 3.2 cm LEFT adrenal mass. Thickened RIGHT adrenal gland. STOMACH/BOWEL: Thickened rectum with perirectal fat stranding. Mild diffuse colonic wall thickening, pericolonic fat stranding. Normal appendix. No bowel obstruction. VASCULAR/LYMPHATIC: Aortoiliac vessels are mildly ectatic with moderate calcific atherosclerosis. Matted abnormal soft tissue about the celiac axis. REPRODUCTIVE: Mild prostatomegaly with coarse calcifications. OTHER: Trace free fluid in LEFT pericolic gutter. No abscess or drainable fluid collections. No intraperitoneal free air. MUSCULOSKELETAL: 2.4 cm RIGHT chest wall intercostal mass corresponding to metastatic deposit on prior MRI. Metallic foreign body RIGHT pelvic wall suggest coil embolic material. Chronic deformity RIGHT iliac bone. Moderate degenerative change of the LEFT hip. Lytic lesion with expansile soft tissue component LEFT L3 vertebral body extending to the spinal canal, status post posterior decompression at this level. Tumor completely effaces the LEFT L3-4 neural foramen. IMPRESSION: Colitis/ proctitis without bowel obstruction. Findings of portal hypertension and cirrhosis with stable to smaller treated hepatic lesion. New 2 x 3.2 cm LEFT adrenal mass, with thickened RIGHT adrenal gland, in the setting of cancer this is most compatible with new metastasis fell. Abnormal soft tissue about the celiac axis most compatible with pathologic lymphadenopathy. Progressed L3 metastasis, status post interval posterior decompression. Severe LEFT L3-4 neural foraminal  narrowing. Stable RIGHT chest wall metastasis. Electronically Signed   By: CElon AlasM.D.   On: 07/15/2016 16:39   Dg Chest Portable 1 View  Result Date: 07/15/2016 CLINICAL DATA:  Shortness of Breath EXAM: PORTABLE CHEST 1 VIEW COMPARISON:  None. FINDINGS: Cardiac shadow is at the upper limits of normal in size. Prominence of mediastinum is noted related to the portable technique. The lungs are well-aerated without focal infiltrate or sizable effusion. No acute bony abnormality is seen. IMPRESSION: No active disease. Electronically Signed   By: MInez CatalinaM.D.   On: 07/15/2016 15:28    Cardiac Studies   Echo results are pending.   Patient Profile     82yo WM with history of Hepatitis C, cirrhosis, and hepatocellular CA presents with atrial fibrillation. He states he has a diarrheal illness for the last 9 days. No abdominal pain, N/V. No dysuria. No recent antibiotic use.  Has decreased appetite and low grade fever. Over last 2 days has noted symptoms of heart racing and  palpitations associated with SOB but no chest pain or dizziness. Seen by GI today who noted him to be in Afib with RVR. Now on diltiazem gtt and being treated for C-diff.   Assessment & Plan    1. Atrial fib: Continues on IV diltiazem gtt at 15 mg/hr. Has not transitioned to po diltiazem likely due to issues with absorption from continued C-Diff diarrhea. Would continue IV diltiazem for now. Give one dose of IV digoxin 0.25 mg IV. Replete potassium daily. Continue to treat C-Diff. Until this clears up, this underlying cause is driving continued Atrial fib. Echo is pending (would not perform until HR controlled).   2. Hypokalemia: Likely from frequent diarrhea. Keep him on po potassium with daily labs. Watch closely with AKI.    Signed, Jory Sims, NP  07/17/2016, 9:30 AM   The patient was seen and examined, and I agree with the physical exam and assessment and plan as documented above. Pt denies chest pain  and shortness of breath. Continues to have diarrhea. Remains hypokalemic. Continues to be in rapid atrial fibrillation with IV diltiazem 15 mg/hr. This will not subside until diarrhea and infection are controlled. Also need to keep potassium levels > 4, as this will increase arrhythmic threshold.  Will give one dose of IV digoxin 0.25 mg today.  Kate Sable, MD, [facility]  07/17/2016 10:05 AM

## 2016-07-18 DIAGNOSIS — I481 Persistent atrial fibrillation: Secondary | ICD-10-CM

## 2016-07-18 DIAGNOSIS — I340 Nonrheumatic mitral (valve) insufficiency: Secondary | ICD-10-CM

## 2016-07-18 LAB — BASIC METABOLIC PANEL
ANION GAP: 4 — AB (ref 5–15)
Anion gap: 7 (ref 5–15)
BUN: 18 mg/dL (ref 6–20)
BUN: 18 mg/dL (ref 6–20)
CALCIUM: 8 mg/dL — AB (ref 8.9–10.3)
CALCIUM: 8.4 mg/dL — AB (ref 8.9–10.3)
CO2: 22 mmol/L (ref 22–32)
CO2: 20 mmol/L — ABNORMAL LOW (ref 22–32)
CREATININE: 1.13 mg/dL (ref 0.61–1.24)
Chloride: 108 mmol/L (ref 101–111)
Chloride: 109 mmol/L (ref 101–111)
Creatinine, Ser: 1.07 mg/dL (ref 0.61–1.24)
GFR calc Af Amer: >60 mL/min (ref >60)
GFR calc Af Amer: >60 mL/min (ref >60)
GFR, EST NON AFRICAN AMERICAN: 59 mL/min — AB (ref >60)
GFR, EST NON AFRICAN AMERICAN: 56 mL/min — AB (ref >60)
GLUCOSE: 137 mg/dL — AB (ref 65–99)
GLUCOSE: 145 mg/dL — AB (ref 65–99)
POTASSIUM: 3.2 mmol/L — AB (ref 3.5–5.1)
Potassium: 3.8 mmol/L (ref 3.5–5.1)
SODIUM: 134 mmol/L — AB (ref 135–145)
SODIUM: 136 mmol/L (ref 135–145)

## 2016-07-18 LAB — CBC
HEMATOCRIT: 29.2 % — AB (ref 39.0–52.0)
HEMOGLOBIN: 9.7 g/dL — AB (ref 13.0–17.0)
MCH: 31.6 pg (ref 26.0–34.0)
MCHC: 33.2 g/dL (ref 30.0–36.0)
MCV: 95.1 fL (ref 78.0–100.0)
Platelets: 89 10*3/uL — ABNORMAL LOW (ref 150–400)
RBC: 3.07 MIL/uL — ABNORMAL LOW (ref 4.22–5.81)
RDW: 15.6 % — AB (ref 11.5–15.5)
WBC: 11.2 10*3/uL — AB (ref 4.0–10.5)

## 2016-07-18 LAB — MAGNESIUM: Magnesium: 1.9 mg/dL (ref 1.7–2.4)

## 2016-07-18 LAB — PHOSPHORUS: PHOSPHORUS: 2.9 mg/dL (ref 2.5–4.6)

## 2016-07-18 MED ORDER — POTASSIUM CHLORIDE CRYS ER 20 MEQ PO TBCR
40.0000 meq | EXTENDED_RELEASE_TABLET | Freq: Two times a day (BID) | ORAL | Status: DC
  Filled 2016-07-18: qty 2

## 2016-07-18 MED ORDER — POTASSIUM CHLORIDE CRYS ER 20 MEQ PO TBCR: 40.0000 meq | EXTENDED_RELEASE_TABLET | Freq: Two times a day (BID) | ORAL | Status: DC

## 2016-07-18 MED ORDER — DIGOXIN 0.25 MG/ML IJ SOLN
0.2500 mg | Freq: Once | INTRAMUSCULAR | Status: AC
  Administered 2016-07-18: 0.25 mg via INTRAVENOUS
  Filled 2016-07-18: qty 2

## 2016-07-18 MED ORDER — POTASSIUM CHLORIDE CRYS ER 20 MEQ PO TBCR: 40.0000 meq | EXTENDED_RELEASE_TABLET | Freq: Once | ORAL | Status: DC

## 2016-07-18 MED ORDER — DIGOXIN 125 MCG PO TABS
0.1250 mg | ORAL_TABLET | Freq: Every day | ORAL | Status: DC
  Administered 2016-07-19 – 2016-07-21 (×3): 0.125 mg via ORAL
  Filled 2016-07-18 (×4): qty 1

## 2016-07-18 MED ORDER — POTASSIUM CHLORIDE 2 MEQ/ML IV SOLN
30.0000 meq | Freq: Once | INTRAVENOUS | Status: AC
Start: 2016-07-18 — End: 2016-07-18
  Administered 2016-07-18: 30 meq via INTRAVENOUS
  Filled 2016-07-18: qty 15

## 2016-07-18 MED ORDER — DIGOXIN 125 MCG PO TABS: 0.1250 mg | ORAL_TABLET | Freq: Every day | ORAL | Status: DC

## 2016-07-18 MED ORDER — POTASSIUM CHLORIDE CRYS ER 20 MEQ PO TBCR
40.0000 meq | EXTENDED_RELEASE_TABLET | Freq: Once | ORAL | Status: AC
  Administered 2016-07-18: 40 meq via ORAL
  Filled 2016-07-18: qty 2

## 2016-07-18 NOTE — Evaluation (Signed)
Physical Therapy Evaluation Patient Details Name: Lee Allen MRN: [id number] DOB: 1925/06/10 Today's Date: 07/18/2016   History of Present Illness  Lee Allen is a 82 y.o. male presenting with afib with RVR . PMH is significant for hx of afib, cirrhosis of liver due to hepatitis C, hepatocellular carcinoma, laminectomy (November 3rd, 2017), Dilated ascending aortic (4 cm) aneurysm.   Clinical Impression  Patient presents with decreased independence with mobility due to deficits listed in PT problem list.  He will benefit from skilled PT in the acute setting to allow return home with family support and follow up HHPT.     Follow Up Recommendations Home health PT;Supervision/Assistance - 24 hour    Equipment Recommendations  None recommended by PT    Recommendations for Other Services       Precautions / Restrictions Precautions Precautions: Fall Restrictions Weight Bearing Restrictions: No      Mobility  Bed Mobility Overal bed mobility: Needs Assistance Bed Mobility: Supine to Sit     Supine to sit: Supervision;HOB elevated     General bed mobility comments: assist for lines. safety  Transfers Overall transfer level: Needs assistance Equipment used: Rolling walker (2 wheeled) Transfers: Sit to/from Stand Sit to Stand: Supervision         General transfer comment: assist for line management and safety  Ambulation/Gait Ambulation/Gait assistance: Min assist Ambulation Distance (Feet): 75 Feet Assistive device: Rolling walker (2 wheeled) Gait Pattern/deviations: Step-through pattern;Decreased stride length;Trunk flexed     General Gait Details: rushing to walk short distance due to weakness and needing to use the bathroom with some continued diarrhea (though not incontinent)  HR up to 128  Stairs            Wheelchair Mobility    Modified Rankin (Stroke Patients Only)       Balance Overall balance assessment: Needs assistance   Sitting  balance-Leahy Scale: Good       Standing balance-Leahy Scale: Fair Standing balance comment: weak and shaky, but able to stand unsupported                             Pertinent Vitals/Pain Pain Assessment: No/denies pain    Home Living Family/patient expects to be discharged to:: Private residence Living Arrangements: Children Available Help at Discharge: Family;Available 24 hours/day Type of Home: House Home Access: Level entry     Home Layout: One level Home Equipment: Cane - single point;Walker - 2 wheels;Shower seat;Toilet riser Additional Comments: Was staying home alone, but daughter planning to stay with pt after d/c    Prior Function Level of Independence: Independent               Hand Dominance   Dominant Hand: Right    Extremity/Trunk Assessment   Upper Extremity Assessment: Generalized weakness           Lower Extremity Assessment: Generalized weakness         Communication   Communication: No difficulties  Cognition Arousal/Alertness: Awake/alert Behavior During Therapy: WFL for tasks assessed/performed Overall Cognitive Status: Within Functional Limits for tasks assessed                      General Comments      Exercises     Assessment/Plan    PT Assessment Patient needs continued PT services  PT Problem List Decreased strength;Decreased activity tolerance;Decreased balance;Decreased mobility;Decreased safety awareness;Decreased knowledge of use of  DME          PT Treatment Interventions DME instruction;Gait training;Therapeutic activities;Therapeutic exercise;Patient/family education;Balance training;Functional mobility training    PT Goals (Current goals can be found in the Care Plan section)  Acute Rehab PT Goals Patient Stated Goal: To return to independent PT Goal Formulation: With patient/family Time For Goal Achievement: 07/25/16 Potential to Achieve Goals: Fair    Frequency Min 3X/week    Barriers to discharge        Co-evaluation               End of Session Equipment Utilized During Treatment: Gait belt Activity Tolerance: Patient limited by fatigue Patient left: in chair;with call bell/phone within reach;with family/visitor present           Time: 0932-0958 PT Time Calculation (min) (ACUTE ONLY): 26 min   Charges:   PT Evaluation $PT Eval Moderate Complexity: 1 Procedure PT Treatments $Gait Training: 8-22 mins   PT G CodesReginia Naas 26-Jul-2016, 10:32 AM  Magda Kiel, [place] 2016-07-26

## 2016-07-18 NOTE — Evaluation (Signed)
Occupational Therapy Evaluation Patient Details Name: Lee Allen MRN: [id number] DOB: Aug 24, 1924 Today's Date: 07/18/2016    History of Present Illness Lee Allen is a 82 y.o. male presenting with afib with RVR . PMH is significant for hx of afib, cirrhosis of liver due to hepatitis C, hepatocellular carcinoma with mets.  laminectomy (November 3rd, 2017), Dilated ascending aortic (4 cm) aneurysm.    Clinical Impression   Pt admitted with above. He demonstrates the below listed deficits and will benefit from continued OT to maximize safety and independence with BADLs.  Pt presents to OT with generalized weakness, impaired balance, decreased activity tolerance.  He fatigues rapidly with ADL activity, and requires min A, overall with multiple rest breaks.  Daughter will provide assist at discharge.  Recommend HHOT      Follow Up Recommendations  Home health OT;Supervision/Assistance - 24 hour    Equipment Recommendations  None recommended by OT    Recommendations for Other Services       Precautions / Restrictions Precautions Precautions: Fall      Mobility Bed Mobility                  Transfers Overall transfer level: Needs assistance Equipment used: Rolling walker (2 wheeled) Transfers: Sit to/from [facility] Sit to Stand: Min guard Stand pivot transfers: Min guard       General transfer comment: min guard for safety     Balance Overall balance assessment: Needs assistance Sitting-balance support: Feet supported Sitting balance-Leahy Scale: Good     Standing balance support: During functional activity;Bilateral upper extremity supported Standing balance-Leahy Scale: Fair                              ADL Overall ADL's : Needs assistance/impaired Eating/Feeding: Independent   Grooming: Wash/dry hands;Wash/dry face;Oral care;Brushing hair;Set up;Sitting   Upper Body Bathing: Set up;Supervision/ safety;Sitting    Lower Body Bathing: Minimal assistance;Sit to/from stand   Upper Body Dressing : Supervision/safety;Set up;Sitting   Lower Body Dressing: Minimal assistance;Sit to/from stand   Toilet Transfer: Minimal assistance;Ambulation;Comfort height toilet;BSC;Grab bars;RW   Toileting- Clothing Manipulation and Hygiene: Sit to/from stand;Min guard       Functional mobility during ADLs: Min guard;Rolling walker General ADL Comments: pt fatigues quickly with activity      Vision     Perception     Praxis      Pertinent Vitals/Pain Pain Assessment: No/denies pain     Hand Dominance Right   Extremity/Trunk Assessment Upper Extremity Assessment Upper Extremity Assessment: Generalized weakness   Lower Extremity Assessment Lower Extremity Assessment: Defer to PT evaluation       Communication Communication Communication: No difficulties   Cognition Arousal/Alertness: Awake/alert Behavior During Therapy: WFL for tasks assessed/performed Overall Cognitive Status: Within Functional Limits for tasks assessed                     General Comments       Exercises Exercises: General Upper Extremity;General Lower Extremity     Shoulder Instructions      Home Living Family/patient expects to be discharged to:: Private residence Living Arrangements: Children Available Help at Discharge: Family;Available 24 hours/day Type of Home: House Home Access: Level entry     Home Layout: One level     Bathroom Shower/Tub: Occupational psychologist: Standard Bathroom Accessibility: Yes   Home Equipment: Cane - single point;Walker -  2 wheels;Shower seat;Toilet riser;Grab bars - tub/shower   Additional Comments: Was staying home alone, but daughter planning to stay with pt after d/c      Prior Functioning/Environment Level of Independence: Independent                 OT Problem List: Decreased strength;Decreased activity tolerance;Impaired balance (sitting  and/or standing);Decreased safety awareness;Decreased knowledge of use of DME or AE   OT Treatment/Interventions: Self-care/ADL training;DME and/or AE instruction;Therapeutic activities;Patient/family education;Balance training    OT Goals(Current goals can be found in the care plan section) Acute Rehab OT Goals Patient Stated Goal: to regain independence  OT Goal Formulation: With patient Time For Goal Achievement: 08/01/16 Potential to Achieve Goals: Good ADL Goals Pt Will Perform Grooming: with supervision;standing Pt Will Perform Lower Body Bathing: with supervision;sit to/from stand Pt Will Perform Lower Body Dressing: with supervision;sit to/from stand Pt Will Transfer to Toilet: with supervision;ambulating;regular height toilet;bedside commode;grab bars Pt Will Perform Toileting - Clothing Manipulation and hygiene: with supervision;sit to/from stand  OT Frequency: Min 2X/week   Barriers to D/C:            Co-evaluation              End of Session Equipment Utilized During Treatment: Rolling walker;Gait belt Nurse Communication: Mobility status  Activity Tolerance: Patient limited by fatigue Patient left: in chair;with call bell/phone within reach   Time: 1155-1212 OT Time Calculation (min): 17 min Charges:  OT General Charges $OT Visit: 1 Procedure OT Evaluation $OT Eval Moderate Complexity: 1 Procedure G-Codes:    Arnett Duddy M 08-Aug-2016, 5:49 PM

## 2016-07-18 NOTE — Progress Notes (Signed)
Family Medicine Teaching Service Daily Progress Note Intern Pager: [phone]  Patient name: Lee Allen Medical record number: [id number] Date of birth: [birth year] Age: 82 y.o. Gender: male  Primary Care Provider: [name], MD Consultants: Cardiology  Code Status: Full   Pt Overview and Major Events to Date:  12/1- admitted with Afib with RVR and diarrhea (C.diff)  Assessment and Plan: Lee Allen is a 82 y.o. male presenting with afib with RVR . PMH is significant for hx of afib, cirrhosis of liver due to hepatitis C, hepatocellular carcinoma, laminectomy (November 3rd, 2017), Dilated ascending aortic (4 cm) aneurysm.   Afib with RVR: Tachycardia improved. CHADVasc score 2 per age. Likely triggered by C.diff. Cardiology following. -Continue Diltiazem drip 5-15 ml/hr titration; plan to transition to PO medicine for HR control once diarrhea is improved -Cardiology following; appreciate recs  -no anticoagulation for now  -Troponin 0.09> 0.03> 0.03>0.03 -ECHO with LV EF: 65% -   70%, Wall motion was normal; there were no regional wall motion abnormalities. -Vitals per floor and pulse ox -continue to monitor on telemetry   C.Diff Colitis: CT positive for colitis and proctitis. Found to be positive for C.diff. 5 bowel movements documented yesterday. -continue Vancomycin 125 mg q6hrs PO, can consider increasing to '500mg'$  QID if diarrhea does not lessen in frequency -Strict I and Os -PT/OT consult  Hypokalemia: K 2.9 on admission, stable at 3.2 today -Continue KDUR 40 BID orally -monitor daily -Mg and Phos wnl after repletion, continue to monitor  AKI: Scr 1.47 >1.26>1.07 this morning. Baseline SCr 1.0; improving with fluid resuscitation  -Avoid nephrotoxic agents  -Continue to monitor  Anemia. Hbg on admission 12.0. Baseline hard to discern but patient has trended between 7-12 over the last 2 years. Hemoglobin this morning 9.7 -continue to monitor -FOBT  negative  Thrombocytopenia: Platelets 133> 95>89 -Will continue to follow closely    Hepatocellular Carcinoma: Medical oncologist Dr. Shirleen Schirmer. Not receiving in any chemotherapy treatment currently. Recent Laminectomy/Decompression in November 11/2 due to L3 metastasis. CT abdomen/pelvis with New 2 x 3.2 cm LEFT adrenal mass, with thickened RIGHT adrenal gland, in the setting of cancer this is most compatible with new metastasis. Abnormal soft tissue about the celiac axis most compatible with pathologic lymphadenopathy. -Continue following   Aortic Aneurysm (4 cm) noted in 2014 -Would consider outpatient follow up abdominal ultrasound to monitor status   Cirrhosis 2/2 Hepatitis C; Transaminates wnl; total bilirubin elevated; Albumin 2.5. INR 1.45/ PT 17.7.  FEN/GI: Regular diet Prophylaxis: Lovenox   Disposition: Home   Subjective:  Patient states he feels improved this morning. He feels his diarrhea is getting under control. He is able to eat and drink and not have things "run through him". No chest pain or other complaints.   Objective: Temp:  [97.7 F (36.5 C)-99.1 F (37.3 C)] 97.7 F (36.5 C) (12/04 0804) Pulse Rate:  [96-121] 96 (12/04 0804) Resp:  [21-29] 25 (12/04 0804) BP: (106-133)/(67-91) 116/71 (12/04 0804) SpO2:  [91 %-97 %] 93 % (12/04 0804) Weight:  [177 lb 6.4 oz (80.5 kg)] 177 lb 6.4 oz (80.5 kg) (12/04 0354) Physical Exam: General: elderly gentleman in no acute distress Cardiovascular: Irregular rhythm, tachycardia, no murmurs rubs or gallops Respiratory: CTAB, no increased work of breathing Abdomen: BS+, soft, mild tenderness diffusely, mild distention  Extremities: No lower extremity edema or cyanosis  Laboratory:  Recent Labs Lab 07/16/16 0207 07/17/16 0407 07/18/16 0443  WBC 9.2 10.6* 11.2*  HGB 10.0* 9.9* 9.7*  HCT 29.6* 29.2* 29.2*  PLT 91* 95* 89*    Recent Labs Lab 07/15/16 1256 07/16/16 0207 07/16/16 2030 07/17/16 0407  07/18/16 0443  NA 137 139 132* 138 134*  K 3.6 2.9* 2.9* 3.2* 3.2*  CL 105 110 106 109 108  CO2 20* 21* 19* 21* 22  BUN 24* 21* '20 18 18  '$ CREATININE 1.47* 1.26* 1.21 1.11 1.07  CALCIUM 8.9 8.4* 8.3* 8.3* 8.0*  PROT 6.1* 5.5*  --   --   --   BILITOT 4.1* 2.8*  --   --   --   ALKPHOS 44 38  --   --   --   ALT 23 21  --   --   --   AST 39 30  --   --   --   GLUCOSE 211* 135* 145* 129* 137*    Imaging/Diagnostic Tests: No results found.  Steve Rattler, DO 07/18/2016, 8:34 AM PGY-1, [place] Intern pager: [phone], text pages welcome

## 2016-07-18 NOTE — Progress Notes (Signed)
Patient Name: Lee Allen Date of Encounter: 07/18/2016  Primary Cardiologist: [facility] Problem List     Active Problems:   Atrial fibrillation with RVR ([place])   A-fib ([place])   Acute kidney injury ([place])   Dehydration   Hepatocellular carcinoma ([facility])   C. difficile colitis     Subjective   Feeling substantially better. Diarrhea decreased from 13-15 stools daily to 3-4 daily. Rate control remains elusive, but is also improved.  Inpatient Medications    Scheduled Meds: . enoxaparin (LOVENOX) injection  40 mg Subcutaneous QHS  . [START ON 07/19/2016] potassium chloride  40 mEq Oral BID  . sodium chloride flush  3 mL Intravenous Q12H  . vancomycin  125 mg Oral Q6H   Continuous Infusions: . diltiazem (CARDIZEM) infusion 15 mg/hr (07/18/16 0417)   PRN Meds: cholestyramine   Vital Signs    Vitals:   07/17/16 2000 07/18/16 0050 07/18/16 0354 07/18/16 0804  BP: 113/68 106/67 116/75 116/71  Pulse: (!) 121 (!) 105 (!) 121 96  Resp: (!) 27 (!) 25 (!) 21 (!) 25  Temp: 99.1 F (37.3 C) 99 F (37.2 C) 98.1 F (36.7 C) 97.7 F (36.5 C)  TempSrc:  Axillary  Oral  SpO2: 91% 93% 97% 93%  Weight:   177 lb 6.4 oz (80.5 kg)   Height:        Intake/Output Summary (Last 24 hours) at 07/18/16 1006 Last data filed at 07/18/16 0900  Gross per 24 hour  Intake              610 ml  Output              350 ml  Net              260 ml   Filed Weights   07/16/16 0400 07/17/16 0139 07/18/16 0354  Weight: 167 lb (75.8 kg) 175 lb 3.2 oz (79.5 kg) 177 lb 6.4 oz (80.5 kg)    Physical Exam   GEN: Well nourished, well developed, in no acute distress.  HEENT: Grossly normal.  Neck: Supple, no JVD, carotid bruits, or masses. Cardiac: irrwegular, 2/6 holosystolic apical murmur, no rubs or gallops. No clubbing, cyanosis, edema.  Radials/DP/PT 2+ and equal bilaterally.  Respiratory:  Respirations regular and unlabored, clear to auscultation bilaterally. GI: Soft, nontender,  nondistended, BS + x 4. MS: no deformity or atrophy. Skin: warm and dry, no rash. Neuro:  Strength and sensation are intact. Psych: AAOx3.  Normal affect.  Labs    CBC  Recent Labs  07/17/16 0407 07/18/16 0443  WBC 10.6* 11.2*  HGB 9.9* 9.7*  HCT 29.2* 29.2*  MCV 94.8 95.1  PLT 95* 89*   Basic Metabolic Panel  Recent Labs  07/17/16 0407 07/17/16 0826 07/18/16 0443  NA 138  --  134*  K 3.2*  --  3.2*  CL 109  --  108  CO2 21*  --  22  GLUCOSE 129*  --  137*  BUN 18  --  18  CREATININE 1.11  --  1.07  CALCIUM 8.3*  --  8.0*  MG  --  1.6* 1.9  PHOS  --  2.2* 2.9   Liver Function Tests  Recent Labs  07/15/16 1256 07/16/16 0207  AST 39 30  ALT 23 21  ALKPHOS 44 38  BILITOT 4.1* 2.8*  PROT 6.1* 5.5*  ALBUMIN 2.5* 2.4*    Recent Labs  07/15/16 1256  LIPASE 18   Cardiac  Enzymes  Recent Labs  07/15/16 2005 07/16/16 0207 07/16/16 0712  TROPONINI 0.03* 0.03* <0.03     Recent Labs  07/15/16 2005  TSH 2.474    Telemetry    AF with borderline rate control - Personally Reviewed  ECG    Afib w RVR and PVCs - Personally Reviewed  Radiology    No results found.  Cardiac Studies   - Left ventricle: The cavity size was normal. There was mild   concentric hypertrophy. Systolic function was vigorous. The   estimated ejection fraction was in the range of 65% to 70%. Wall   motion was normal; there were no regional wall motion   abnormalities. - Mitral valve: Calcified annulus. There was moderate to severe   regurgitation directed centrally. - Left atrium: The atrium was moderately to severely dilated. - Right atrium: The atrium was moderately to severely dilated. - Tricuspid valve: There was mild-moderate regurgitation directed   centrally. - Pulmonary arteries: Systolic pressure was mildly increased. PA   peak pressure: 36 mm Hg (S).  Patient Profile  82 yo WM with history of Hepatitis C, cirrhosis, and hepatocellular CA presents with  atrial fibrillation with RVR, triggered during prolonged diarrheal illness, being treated for C-diff.   Assessment & Plan    1. Atrial fib: Rate control a little better, still suboptimal. Continues on IV diltiazem gtt at 15 mg/hr. Give one more dose of IV digoxin 0.25 mg IV and start PO daily. Replete potassium daily, need to aggressively correct while on dig. Continue to treat C-Diff. CHADSVasc 2 (age only)and high bleeding risk. At most conider short term anticoagulation if we perform cardioversion. No plans for cardioversion until diarrhea and electrolyte abnormalities are corrected. 2. Hypokalemia: Likely from frequent diarrhea. Keep him on po potassium with daily labs. Watch closely with AKI.  2. Moderate to severe MR: may be underlying substrate for AF, but his EF is normal and I doubt he will be a surgical candidate. Was described as mild to moderate in 2014 on TEE. Mechanism unclear. Medical Rx. 3. Hypokalemia: Likely from frequent diarrhea. Keep him on po potassium with daily labs. Watch closely with AKI.    Signed, Sanda Klein, MD  07/18/2016, 10:06 AM

## 2016-07-19 ENCOUNTER — Inpatient Hospital Stay (HOSPITAL_COMMUNITY): Payer: Medicare Other

## 2016-07-19 DIAGNOSIS — I340 Nonrheumatic mitral (valve) insufficiency: Secondary | ICD-10-CM

## 2016-07-19 DIAGNOSIS — R0600 Dyspnea, unspecified: Secondary | ICD-10-CM

## 2016-07-19 LAB — FECAL LACTOFERRIN, QUANT: LACTOFERRIN, FECAL, QUANT.: 8.77 ug/mL — AB (ref 0.00–7.24)

## 2016-07-19 LAB — URINALYSIS, ROUTINE W REFLEX MICROSCOPIC
Bilirubin Urine: NEGATIVE
Glucose, UA: NEGATIVE mg/dL
Hgb urine dipstick: NEGATIVE
Ketones, ur: NEGATIVE mg/dL
Nitrite: NEGATIVE
Protein, ur: NEGATIVE mg/dL
SPECIFIC GRAVITY, URINE: 1.006 (ref 1.005–1.030)
pH: 5 (ref 5.0–8.0)

## 2016-07-19 LAB — CBC
HCT: 29.3 % — ABNORMAL LOW (ref 39.0–52.0)
HEMOGLOBIN: 9.8 g/dL — AB (ref 13.0–17.0)
MCH: 31.7 pg (ref 26.0–34.0)
MCHC: 33.4 g/dL (ref 30.0–36.0)
MCV: 94.8 fL (ref 78.0–100.0)
PLATELETS: 93 10*3/uL — AB (ref 150–400)
RBC: 3.09 MIL/uL — AB (ref 4.22–5.81)
RDW: 15.5 % (ref 11.5–15.5)
WBC: 11.3 10*3/uL — AB (ref 4.0–10.5)

## 2016-07-19 LAB — RENAL FUNCTION PANEL
ALBUMIN: 2.3 g/dL — AB (ref 3.5–5.0)
Anion gap: 5 (ref 5–15)
BUN: 19 mg/dL (ref 6–20)
CO2: 22 mmol/L (ref 22–32)
CREATININE: 1.01 mg/dL (ref 0.61–1.24)
Calcium: 8.4 mg/dL — ABNORMAL LOW (ref 8.9–10.3)
Chloride: 105 mmol/L (ref 101–111)
GFR calc Af Amer: >60 mL/min (ref >60)
Glucose, Bld: 132 mg/dL — ABNORMAL HIGH (ref 65–99)
PHOSPHORUS: 2.4 mg/dL — AB (ref 2.5–4.6)
Potassium: 3.6 mmol/L (ref 3.5–5.1)
Sodium: 132 mmol/L — ABNORMAL LOW (ref 135–145)

## 2016-07-19 LAB — MAGNESIUM: MAGNESIUM: 1.8 mg/dL (ref 1.7–2.4)

## 2016-07-19 MED ORDER — POTASSIUM PHOSPHATES 15 MMOLE/5ML IV SOLN
20.0000 meq | Freq: Once | INTRAVENOUS | Status: AC
  Administered 2016-07-19: 20 meq via INTRAVENOUS
  Filled 2016-07-19: qty 4.55

## 2016-07-19 MED ORDER — POTASSIUM CHLORIDE CRYS ER 20 MEQ PO TBCR
40.0000 meq | EXTENDED_RELEASE_TABLET | Freq: Two times a day (BID) | ORAL | Status: DC
  Administered 2016-07-19 – 2016-07-21 (×6): 40 meq via ORAL
  Filled 2016-07-19 (×6): qty 2

## 2016-07-19 MED ORDER — TRAMADOL HCL 50 MG PO TABS
50.0000 mg | ORAL_TABLET | Freq: Once | ORAL | Status: AC
  Administered 2016-07-19: 50 mg via ORAL
  Filled 2016-07-19: qty 1

## 2016-07-19 MED ORDER — DILTIAZEM HCL ER COATED BEADS 180 MG PO CP24
360.0000 mg | ORAL_CAPSULE | Freq: Every day | ORAL | Status: DC
  Administered 2016-07-19 – 2016-07-23 (×5): 360 mg via ORAL
  Filled 2016-07-19 (×5): qty 2

## 2016-07-19 MED ORDER — FUROSEMIDE 10 MG/ML IJ SOLN
20.0000 mg | Freq: Once | INTRAMUSCULAR | Status: AC
  Administered 2016-07-19: 20 mg via INTRAVENOUS
  Filled 2016-07-19: qty 2

## 2016-07-19 MED ORDER — MAGNESIUM SULFATE 2 GM/50ML IV SOLN
2.0000 g | Freq: Once | INTRAVENOUS | Status: AC
  Administered 2016-07-19: 2 g via INTRAVENOUS
  Filled 2016-07-19: qty 50

## 2016-07-19 NOTE — Progress Notes (Signed)
Family Medicine Teaching Service Daily Progress Note Intern Pager: [phone]  Patient name: Lee Allen Medical record number: [id number] Date of birth: 07-13-1925 Age: 82 y.o. Gender: male  Primary Care Provider: [name], MD Consultants: Cardiology  Code Status: Full   Pt Overview and Major Events to Date:  12/1- admitted with Afib with RVR and diarrhea (C.diff)  Assessment and Plan: Lee Allen is a 82 y.o. male presenting with afib with RVR . PMH is significant for hx of afib, cirrhosis of liver due to hepatitis C, hepatocellular carcinoma, laminectomy (November 3rd, 2017), Dilated ascending aortic (4 cm) aneurysm.   Afib with RVR: Tachycardia improved. CHADVasc score 2 per age. Likely triggered by C.diff. Cardiology following. Heart rate improved this morning, low 100's. -Cardiology following; appreciate recs  -Continue Diltiazem drip 5-15 ml/hr titration; plan to transition to PO medicine for HR control once diarrhea is improved - s/p two doses of digoxin to control heart rate -no anticoagulation for now  -Troponin 0.09> 0.03> 0.03>0.03 -ECHO with LV EF: 65% - 70%, Wall motion was normal; there were no regional wall motion abnormalities. -Vitals per floor and pulse ox -continue to monitor on telemetry   C.Diff Colitis-improving: CT positive for colitis and proctitis. Found to be positive for C.diff. 3 bowel movements documented yesterday, improving daily.  -continue Vancomycin 125 mg q6hrs PO -Strict I and Os -PT/OT recommending home health PT/OT with 24 hour supervision -will contact family with patient's permission to determine dispo  Hypokalemia: K 2.9 on admission, goal per cardiology is to keep K over 4. This morning potassium 3.6. -Continue KDUR 40 BID orally -monitor daily -Mg and Phos trending downward today, will replete   AKI-resolved: Scr 1.47 >1.26>1.07>1.01 this morning. Baseline SCr 1.0; improving with fluid resuscitation  -Avoid  nephrotoxic agents  -Continue to monitor  Anemia-stable. Hbg on admission 12.0. Baseline hard to discern but patient has trended between 7-12 over the last 2 years. Hemoglobin this morning 9.8, stable -continue to monitor -FOBT negative  Thrombocytopenia-stable: Platelets 133> 95>89>93, stable, appears to be at his baseline -Will continue to follow   FEN/GI: Regular diet Prophylaxis: Lovenox   Disposition: plan for home with HHPT/OT, 24 hour supervision vs SNF  Subjective:  [name] is doing well today. He feels improved and is having less diarrhea. Denies chest pain, shortness of breath. Reports he lives alone at home but has two daughters nearby that check in on him and help him with meals.  Objective: Temp:  [97.5 F (36.4 C)-98.8 F (37.1 C)] 97.5 F (36.4 C) (12/05 0732) Pulse Rate:  [84-104] 94 (12/05 0732) Resp:  [22-26] 22 (12/05 0732) BP: (107-123)/(63-78) 111/72 (12/05 0732) SpO2:  [93 %-96 %] 95 % (12/05 0732) Weight:  [176 lb 14.4 oz (80.2 kg)] 176 lb 14.4 oz (80.2 kg) (12/05 0502) Physical Exam: General: elderly gentleman in no acute distress Cardiovascular: Irregular rhythm, regular rate, no murmurs rubs or gallops Respiratory: CTAB, no increased work of breathing Abdomen: BS+, soft, mild tenderness diffusely, mild distention  Extremities: No lower extremity edema or cyanosis  Laboratory:  Recent Labs Lab 07/17/16 0407 07/18/16 0443 07/19/16 0437  WBC 10.6* 11.2* 11.3*  HGB 9.9* 9.7* 9.8*  HCT 29.2* 29.2* 29.3*  PLT 95* 89* 93*    Recent Labs Lab 07/15/16 1256 07/16/16 0207  07/18/16 0443 07/18/16 1401 07/19/16 0437  NA 137 139  < > 134* 136 132*  K 3.6 2.9*  < > 3.2* 3.8 3.6  CL 105 110  < >  108 109 105  CO2 20* 21*  < > 22 20* 22  BUN 24* 21*  < > '18 18 19  '$ CREATININE 1.47* 1.26*  < > 1.07 1.13 1.01  CALCIUM 8.9 8.4*  < > 8.0* 8.4* 8.4*  PROT 6.1* 5.5*  --   --   --   --   BILITOT 4.1* 2.8*  --   --   --   --   ALKPHOS 44 38  --   --    --   --   ALT 23 21  --   --   --   --   AST 39 30  --   --   --   --   GLUCOSE 211* 135*  < > 137* 145* 132*  < > = values in this interval not displayed.  Imaging/Diagnostic Tests: No results found.  Steve Rattler, DO 07/19/2016, 9:12 AM PGY-1, [place] Intern pager: [phone], text pages welcome

## 2016-07-19 NOTE — Consult Note (Signed)
            [facility] CM Primary Care Navigator  07/19/2016  Lee Allen 01/03/1925 [id number]  Went to see patient in the room but he is still asleep. No family members seen at the bedside. Nursing staff reports that patient has two daughters who were in yesterday.  Will try to see patient at a later time when available.  For additional questions please contact:  Edwena Felty A. Lawton Dollinger, BSN, RN-BC [facility] PRIMARY CARE Navigator Cell: [phone]

## 2016-07-19 NOTE — Progress Notes (Signed)
Patient Name: Lee Allen Date of Encounter: 07/19/2016  Primary Cardiologist: [facility] Problem List     Active Problems:   Atrial fibrillation with RVR ([place])   A-fib ([place])   Acute kidney injury ([place])   Dehydration   Hepatocellular carcinoma (HCC)   C. difficile colitis   Dyspnea     Subjective   "I feel beat". Diarrhea only x 4 today, but tired and appears dyspneic. Remains in AFib, but now with good rate control (90s).  Inpatient Medications    Scheduled Meds: . digoxin  0.125 mg Oral Daily  . diltiazem  360 mg Oral Daily  . enoxaparin (LOVENOX) injection  40 mg Subcutaneous QHS  . potassium chloride  40 mEq Oral Once  . potassium chloride  40 mEq Oral BID  . sodium chloride flush  3 mL Intravenous Q12H  . traMADol  50 mg Oral Once  . vancomycin  125 mg Oral Q6H   Continuous Infusions:  PRN Meds: cholestyramine   Vital Signs    Vitals:   07/19/16 0005 07/19/16 0502 07/19/16 0732 07/19/16 1122  BP: 107/70 123/75 111/72 112/77  Pulse: 84 85 94 92  Resp: (!) 25 (!) 24 (!) 22 (!) 26  Temp: 98.8 F (37.1 C) 98.3 F (36.8 C) 97.5 F (36.4 C) 97.6 F (36.4 C)  TempSrc: Oral Oral Oral Oral  SpO2: 95% 93% 95% 96%  Weight:  176 lb 14.4 oz (80.2 kg)    Height:        Intake/Output Summary (Last 24 hours) at 07/19/16 1627 Last data filed at 07/19/16 1300  Gross per 24 hour  Intake              270 ml  Output               50 ml  Net              220 ml   Filed Weights   07/17/16 0139 07/18/16 0354 07/19/16 0502  Weight: 175 lb 3.2 oz (79.5 kg) 177 lb 6.4 oz (80.5 kg) 176 lb 14.4 oz (80.2 kg)    Physical Exam   Pale, looks tired GEN: Well nourished, well developed, in no acute distress.  HEENT: Grossly normal.  Neck: Supple, JVP 8-9 cm, almost to angle of jaw, no carotid bruits, goiter or masses. Cardiac: irregular, 2/6 holosystolic apical murmur, no rubs or gallops. No clubbing, cyanosis, edema.  Radials/DP/PT 2+ and equal bilaterally.    Respiratory:  Respirations regular but fast, prolonged expiration, very faint wheezing, bibasilar rales GI: Soft, nontender, nondistended, BS + x 4. MS: no deformity or atrophy. Skin: warm and dry, no rash. Neuro:  Strength and sensation are intact. Psych: AAOx3.  Normal affect.  Labs    CBC  Recent Labs  07/18/16 0443 07/19/16 0437  WBC 11.2* 11.3*  HGB 9.7* 9.8*  HCT 29.2* 29.3*  MCV 95.1 94.8  PLT 89* 93*   Basic Metabolic Panel  Recent Labs  07/18/16 0443 07/18/16 1401 07/19/16 0437  NA 134* 136 132*  K 3.2* 3.8 3.6  CL 108 109 105  CO2 22 20* 22  GLUCOSE 137* 145* 132*  BUN '18 18 19  '$ CREATININE 1.07 1.13 1.01  CALCIUM 8.0* 8.4* 8.4*  MG 1.9  --  1.8  PHOS 2.9  --  2.4*   Liver Function Tests  Recent Labs  07/19/16 0437  ALBUMIN 2.3*     Telemetry    AF with controlled  rate - Personally Reviewed  Radiology    CXR ordered, pending  Cardiac Studies   Echo 07/17/16  - Left ventricle: The cavity size was normal. There was mild concentric hypertrophy. Systolic function was vigorous. The estimated ejection fraction was in the range of 65% to 70%. Wall motion was normal; there were no regional wall motion abnormalities. - Mitral valve: Calcified annulus. There was moderate to severe regurgitation directed centrally. - Left atrium: The atrium was moderately to severely dilated. - Right atrium: The atrium was moderately to severely dilated. - Tricuspid valve: There was mild-moderate regurgitation directed centrally. - Pulmonary arteries: Systolic pressure was mildly increased. PA peak pressure: 36 mm Hg (S).   Patient Profile     82 yo WM with history of Hepatitis C, cirrhosis, and hepatocellular CA presents with atrial fibrillation with RVR, triggered during prolonged diarrheal illness, being treated for C-diff.   Assessment & Plan    1. Atrial fib: Rate controlbette. Switch to PO diltiazem and continue digoxin daily.  CHADSVasc 2 (age only)and high bleeding risk. At most conider short term anticoagulation if we perform cardioversion. No plans for cardioversion until diarrhea and electrolyte abnormalities are corrected. 2. Moderate to severe MR: may be underlying substrate for AF, but his EF is normal and I doubt he will be a surgical candidate. Was described as mild to moderate in 2014 on TEE. Mechanism unclear. Medical Rx. Note signs of hypervolemia on exam. Waiting for CXR. Will give one dose of diuretic. 3. Hypokalemia: resolved 4. C.diff diarrhea  Signed, Sanda Klein, MD  07/19/2016, 4:27 PM

## 2016-07-19 NOTE — Care Management Important Message (Signed)
Important Message  Patient Details  Name: Lee Allen MRN: [id number] Date of Birth: November 03, 1924   Medicare Important Message Given:  Yes    [name], RN 07/19/2016, 11:22 AM

## 2016-07-20 LAB — RENAL FUNCTION PANEL
ANION GAP: 4 — AB (ref 5–15)
Albumin: 2.2 g/dL — ABNORMAL LOW (ref 3.5–5.0)
BUN: 18 mg/dL (ref 6–20)
CHLORIDE: 108 mmol/L (ref 101–111)
CO2: 23 mmol/L (ref 22–32)
Calcium: 8.5 mg/dL — ABNORMAL LOW (ref 8.9–10.3)
Creatinine, Ser: 0.98 mg/dL (ref 0.61–1.24)
Glucose, Bld: 119 mg/dL — ABNORMAL HIGH (ref 65–99)
POTASSIUM: 4 mmol/L (ref 3.5–5.1)
Phosphorus: 3.2 mg/dL (ref 2.5–4.6)
Sodium: 135 mmol/L (ref 135–145)

## 2016-07-20 LAB — MAGNESIUM: MAGNESIUM: 1.9 mg/dL (ref 1.7–2.4)

## 2016-07-20 MED ORDER — APIXABAN 5 MG PO TABS
5.0000 mg | ORAL_TABLET | Freq: Two times a day (BID) | ORAL | Status: DC
  Administered 2016-07-20 – 2016-07-23 (×7): 5 mg via ORAL
  Filled 2016-07-20 (×7): qty 1

## 2016-07-20 MED ORDER — SODIUM CHLORIDE 0.9 % IV SOLN
INTRAVENOUS | Status: DC
  Administered 2016-07-20: 13:00:00 via INTRAVENOUS

## 2016-07-20 NOTE — Care Management Note (Addendum)
Case Management Note  Patient Details  Name: Lee Allen MRN: [id number] Date of Birth: Nov 10, 1924  Subjective/Objective:   Pt presented for Atrial Fib with RVR. Pt is from home alone. Pt has support of daughters. Pt has DME RW, Cane, Hospital Bed, 3n1 from prior use by wife.                  Action/Plan: CM was able to speak with patient and daughter in regards to disposition needs. Pt is agreeable to [facility] refuses OT. Agency List provided and pt has used [facility] in the past and would like to utilize again. CM did speak with daughter in regards to 24 hr supervision. Daughter has spoken to personal care agencies and she has some friends that are willing to sit with patient. CM did make referral with [facility] in regards to therapy. SOC to begin within 24- 48 hours post d/c.   Expected Discharge Date:                  Expected Discharge Plan:  [place]  In-House Referral:  NA  Discharge planning Services  CM Consult  Post Acute Care Choice:  Home Health Choice offered to:     DME Arranged:  N/A DME Agency:  NA  HH Arranged:  PT HH Agency:  [facility]  Status of Service:  Completed, signed off  If discussed at [place] of Stay Meetings, dates discussed:  07-20-16  Additional Comments: 1321 07-21-16 [name], RN,BSN [phone] CM did get call from MD in regards to cost for vancomycin- MD wanted exact cost. CM did ask her to send Rx to pharmacy and then we would have an exact cost. [name] can do tablet or solution. Benefits check stated 25% of cost. CM will continue to monitor.    1159 07-21-16 [name], RN<BSN [phone]  Benefits check in process for Eliquis. CM will provide pt with the 30 day card. Will make patient aware of cost once completed. Plan for cardioversion on Friday.    1628 07-20-16 [name], RN,BSN [phone] CM did receive call from MD to see if pt could get the oral  solution vancomycin. Pt uses [facility] did call to see if oral vancomycin solution could be ordered. [name] not able to order. [place] can order tablet and the oral solution if has the Rx. Pharmacy stated they prefer the tablet form @ d/c if pt can take the tablet instead of oral solution. CM did a benefits check:  VANCOMYCIN 50 MG / ML ORAL SOLUTION 125 MG  NOT ON FORMULARY   VANCOCIN 50 MG / ML ORAL SOLUTION 125 MG   COVER- YES  CO-PAY- 25 % OF COST  TIER- 5 DRUG  PRIOR APPROVAL- NO  PH[facility]ACY : [facility]  [name], RN 07/20/2016, 12:29 PM

## 2016-07-20 NOTE — Progress Notes (Signed)
Family Medicine Teaching Service Daily Progress Note Intern Pager: [phone]  Patient name: Lee Allen Medical record number: [id number] Date of birth: 13-Apr-1925 Age: 82 y.o. Gender: male  Primary Care Provider: [name], MD Consultants: Cardiology  Code Status: Full   Pt Overview and Major Events to Date:  12/1- admitted with Afib with RVR and diarrhea (C.diff)  Assessment and Plan: Lee Allen is a 82 y.o. male presenting with afib with RVR . PMH is significant for hx of afib, cirrhosis of liver due to hepatitis C, hepatocellular carcinoma, laminectomy (November 3rd, 2017), Dilated ascending aortic (4 cm) aneurysm.   Afib with RVR: Tachycardia improved. CHADVasc score 2 per age. Likely triggered by C.diff. Cardiology following. Heart rate 82 this morning. -Cardiology following; appreciate recs  -Continue PO diltiazem and digoxin per cardiology -no anticoagulation for now  -Vitals per floor and pulse ox -continue to monitor on telemetry   C.Diff Colitis-improving: CT positive for colitis and proctitis. Found to be positive for C.diff. 3 BM yesterday. One loose BM this morning.  -continue Vancomycin 125 mg q6hrs PO -Strict I and Os -PT/OT recommending home health PT/OT with 24 hour supervision -family working on acquiring home health aid  Hypokalemia: K 2.9 on admission, goal per cardiology is to keep K over 4. This morning potassium 4.0 -Continue KDUR 40 BID orally -monitor daily -Mg and Phos within normal range  AKI-resolved: Scr 1.47 >1.26>1.07>1.01>0.98 this morning. Baseline SCr 1.0; improving with fluid resuscitation  -Avoid nephrotoxic agents  -Continue to monitor  Anemia-stable. Hbg on admission 12.0. Baseline hard to discern but patient has trended between 7-12 over the last 2 years. Hemoglobin stable at 9.8 -continue to monitor -FOBT negative  Thrombocytopenia-stable: Platelets 133> 95>89>93, stable, appears to be at his baseline -Will  continue to follow  FEN/GI: Regular diet Prophylaxis: Lovenox   Disposition: plan for home with HHPT/OT, 24 hour supervision  Subjective:  Lee Allen is doing well. He is asking to go home. Denies chest pain, palpitations, shortness of breath. Had one loose BM this morning. No abdominal pain or cramping  Objective: Temp:  [97.5 F (36.4 C)-98.3 F (36.8 C)] 98.1 F (36.7 C) (12/06 0456) Pulse Rate:  [69-100] 82 (12/06 0503) Resp:  [22-26] 22 (12/06 0503) BP: (111-117)/(65-77) 111/68 (12/06 0503) SpO2:  [92 %-96 %] 94 % (12/06 0503) Weight:  [178 lb 9.6 oz (81 kg)] 178 lb 9.6 oz (81 kg) (12/06 0500) Physical Exam: General: elderly gentleman in no acute distress, laying in bed Cardiovascular: Irregular rhythm, tachycardic, no murmurs rubs or gallops Respiratory: CTAB, no increased work of breathing Abdomen: BS+, soft, mild tenderness diffusely, mild distention  Extremities: No lower extremity edema or cyanosis  Laboratory:  Recent Labs Lab 07/17/16 0407 07/18/16 0443 07/19/16 0437  WBC 10.6* 11.2* 11.3*  HGB 9.9* 9.7* 9.8*  HCT 29.2* 29.2* 29.3*  PLT 95* 89* 93*    Recent Labs Lab 07/15/16 1256 07/16/16 0207  07/18/16 1401 07/19/16 0437 07/20/16 0445  NA 137 139  < > 136 132* 135  K 3.6 2.9*  < > 3.8 3.6 4.0  CL 105 110  < > 109 105 108  CO2 20* 21*  < > 20* 22 23  BUN 24* 21*  < > '18 19 18  '$ CREATININE 1.47* 1.26*  < > 1.13 1.01 0.98  CALCIUM 8.9 8.4*  < > 8.4* 8.4* 8.5*  PROT 6.1* 5.5*  --   --   --   --   BILITOT 4.1* 2.8*  --   --   --   --  ALKPHOS 44 38  --   --   --   --   ALT 23 21  --   --   --   --   AST 39 30  --   --   --   --   GLUCOSE 211* 135*  < > 145* 132* 119*  < > = values in this interval not displayed.  Imaging/Diagnostic Tests: Dg Chest 2 View  Result Date: 07/19/2016 CLINICAL DATA:  82 y/o  M; dyspnea. EXAM: CHEST  2 VIEW COMPARISON:  07/15/2016 chest radiograph FINDINGS: Stable borderline cardiomegaly given projection and  technique. Low lung volumes accentuate pulmonary markings. Patchy right basilar opacity probably represents atelectasis. No pleural effusion. No pneumothorax. Bones are unremarkable. IMPRESSION: Low lung volumes. Right basilar opacity is probably atelectasis. No acute pulmonary process. Electronically Signed   By: Kristine Garbe M.D.   On: 07/19/2016 20:35   -ECHO with LV EF: 65% - 70%, Wall motion was normal; there were no regional wall motion abnormalities.  Steve Rattler, DO 07/20/2016, 7:21 AM PGY-1, [place] Intern pager: [phone], text pages welcome

## 2016-07-20 NOTE — Consult Note (Signed)
            [facility] CM Primary Care Navigator  07/20/2016  Lee Allen [birth year] [id number]   Attempted to see patient again today but patient is sound asleep (snoring). No family members present at the bedside.  Will try again to see patient at a later time when available  For additional questions please contact:  Edwena Felty A. Gino Garrabrant, BSN, RN-BC [facility] PRIMARY CARE Navigator Cell: [phone]

## 2016-07-20 NOTE — Progress Notes (Signed)
ANTICOAGULATION CONSULT NOTE - Initial Consult  Pharmacy Consult for Eliquis Indication: atrial fibrillation  Allergies  Allergen Reactions  . Penicillins Other (See Comments) and Dermatitis    Skin peeled from inside of mouth unknown    Patient Measurements: Height: 5' 7.5" (171.5 cm) Weight: 178 lb 9.6 oz (81 kg) IBW/kg (Calculated) : 67.25   Vital Signs: Temp: 98.3 F (36.8 C) (12/06 0813) Temp Source: Oral (12/06 0813) BP: 111/68 (12/06 0503) Pulse Rate: 82 (12/06 0503)  Labs:  Recent Labs  07/18/16 0443 07/18/16 1401 07/19/16 0437 07/20/16 0445  HGB 9.7*  --  9.8*  --   HCT 29.2*  --  29.3*  --   PLT 89*  --  93*  --   CREATININE 1.07 1.13 1.01 0.98    Estimated Creatinine Clearance: 52.6 mL/min (by C-G formula based on SCr of 0.98 mg/dL).   Medical History: Past Medical History:  Diagnosis Date  . Ascending aorta dilation (HCC)    4 cm by chest CT 2/14  . Atrial flutter ([place])    09/2012 s/p TEE/DCCV  . CAD (coronary artery disease)    coronary calcification by chest CT 09/2012  . GERD (gastroesophageal reflux disease)   . [place] (hepatocellular carcinoma) ([place])    s/p ablation, with extension to Abdominal/chest wall  . Hepatitis C   . History of tobacco abuse    quit 1972  . Liver cirrhosis ([place])   . Nephrolithiasis   . Shingles   . Skin cancer     Medications:  Prescriptions Prior to Admission  Medication Sig Dispense Refill Last Dose  . acetaminophen (TYLENOL) 325 MG tablet Take 650 mg by mouth every 6 (six) hours as needed.   Past Month at Unknown time  . aspirin EC 81 MG tablet Take 81 mg by mouth daily as needed. FOR  CARDIOVASCULAR   Past Week at Unknown time  . cholestyramine (QUESTRAN) 4 g packet Take 1 packet by mouth daily as needed for itching.   Past Month at Unknown time  . gabapentin (NEURONTIN) 300 MG capsule Take 300 mg by mouth 2 (two) times daily.   07/14/2016 at Unknown time  . loperamide (IMODIUM A-D) 2 MG tablet Take 2 mg by  mouth 4 (four) times daily as needed for diarrhea or loose stools.   Past Week at Unknown time  . naproxen sodium (ANAPROX) 220 MG tablet Take 220 mg by mouth daily as needed (for pain).   Past Week at Unknown time    Assessment:  Anticoag: Afib (82 y/o, 81kg, Scr 0.98. Hgb down to 9.8 (12 on admit). Plts 93 (133 on admit). CHADSVasc 2 (age only) and high bleeding risk. Anticoagulation short-term.   Goal of Therapy:  Therapeutic oral anticoagulation Monitor platelets by anticoagulation protocol: Yes   Plan:  Eliquis '5mg'$  BID D/c SQ Lovenox  Cy Bresee S. Alford Highland, PharmD, [facility] Clinical Staff Pharmacist Pager [phone]  Eilene Ghazi Stillinger 07/20/2016,11:05 AM

## 2016-07-20 NOTE — Progress Notes (Addendum)
PT Cancellation Note  Patient Details Name: Lee Allen MRN: [id number] DOB: [birth year]   Cancelled Treatment:    Reason Eval/Treat Not Completed: Patient declined, stating he has had a busy AM and just needed to rest.  Offered to A pt up to recliner and pt declined. Discussed with nursing who reports pt has not had any tests or done any ambulation with other staff members. Will check back as schedule permits.   Addendum: Checked back and CNA assisting patient with getting cleaned up.  He said he didn't want therapy today.  Spoke with CNA about encouraging mobility and she was going to see if he would get up to chair with her later. Will check back tomorrow.  Ripoll,Atha Mcbain LUBECK 07/20/2016, 11:24 AM

## 2016-07-20 NOTE — Progress Notes (Signed)
OT Cancellation Note  Patient Details Name: [name] MRN: [id number] DOB: March 01, 1925   Cancelled Treatment:    Reason Eval/Treat Not Completed: Patient declined, no reason specified;Fatigue/lethargy limiting ability to participate. Pt reports he is tired and does not want to get OOB at this time, declining to work with OT. Will follow up as time allows.  Binnie Kand M.S., OTR/L Pager: [phone]  07/20/2016, 5:09 PM

## 2016-07-20 NOTE — Progress Notes (Signed)
Patient Name: Lee Allen Date of Encounter: 07/20/2016  Primary Cardiologist: Dr. Ladonna Snide Problem List     Active Problems:   Atrial fibrillation with RVR (HCC)   A-fib ([place])   Acute kidney injury ([place])   Dehydration   Hepatocellular carcinoma ([place])   C. difficile colitis   Dyspnea   Mitral valve insufficiency     Subjective   Says he feels much better today. He has not had any diarrhea overnight.   Inpatient Medications    Scheduled Meds: . digoxin  0.125 mg Oral Daily  . diltiazem  360 mg Oral Daily  . enoxaparin (LOVENOX) injection  40 mg Subcutaneous QHS  . potassium chloride  40 mEq Oral Once  . potassium chloride  40 mEq Oral BID  . sodium chloride flush  3 mL Intravenous Q12H  . vancomycin  125 mg Oral Q6H   Continuous Infusions:  PRN Meds: cholestyramine   Vital Signs    Vitals:   07/20/16 0036 07/20/16 0456 07/20/16 0500 07/20/16 0503  BP:  111/68  111/68  Pulse: 93 69  82  Resp: (!) 23 (!) 22  (!) 22  Temp: 97.8 F (36.6 C) 98.1 F (36.7 C)    TempSrc: Oral Oral    SpO2: 92% 93%  94%  Weight:   178 lb 9.6 oz (81 kg)   Height:        Intake/Output Summary (Last 24 hours) at 07/20/16 0737 Last data filed at 07/20/16 0502  Gross per 24 hour  Intake              400 ml  Output              670 ml  Net             -270 ml   Filed Weights   07/18/16 0354 07/19/16 0502 07/20/16 0500  Weight: 177 lb 6.4 oz (80.5 kg) 176 lb 14.4 oz (80.2 kg) 178 lb 9.6 oz (81 kg)    Physical Exam   GEN: Elderly male in no acute distress.  HEENT: Grossly normal.  Neck: Supple, jaw JVD, carotid bruits, or masses. Cardiac: irregularly irregular , 2/6 systolic murmur, No rubs, or gallops. No clubbing, cyanosis, edema.  Radials/DP/PT 2+ and equal bilaterally.  Respiratory:  Respirations regular and unlabored, diminished in bilateral lower lobes.  GI: Soft, nontender, nondistended, BS + x 4. MS: no deformity or atrophy. Skin: warm and dry, no  rash. Neuro:  Strength and sensation are intact. Psych: AAOx3.  Normal affect.  Labs    CBC  Recent Labs  07/18/16 0443 07/19/16 0437  WBC 11.2* 11.3*  HGB 9.7* 9.8*  HCT 29.2* 29.3*  MCV 95.1 94.8  PLT 89* 93*   Basic Metabolic Panel  Recent Labs  07/19/16 0437 07/20/16 0445  NA 132* 135  K 3.6 4.0  CL 105 108  CO2 22 23  GLUCOSE 132* 119*  BUN 19 18  CREATININE 1.01 0.98  CALCIUM 8.4* 8.5*  MG 1.8 1.9  PHOS 2.4* 3.2   Liver Function Tests  Recent Labs  07/19/16 0437 07/20/16 0445  ALBUMIN 2.3* 2.2*     Telemetry    Afib, rates 90-100's- Personally Reviewed    Radiology    Dg Chest 2 View  Result Date: 07/19/2016 CLINICAL DATA:  82 y/o  M; dyspnea. EXAM: CHEST  2 VIEW COMPARISON:  07/15/2016 chest radiograph FINDINGS: Stable borderline cardiomegaly given projection and technique. Low lung volumes accentuate  pulmonary markings. Patchy right basilar opacity probably represents atelectasis. No pleural effusion. No pneumothorax. Bones are unremarkable. IMPRESSION: Low lung volumes. Right basilar opacity is probably atelectasis. No acute pulmonary process. Electronically Signed   By: Kristine Garbe M.D.   On: 07/19/2016 20:35    Cardiac Studies   Transthoracic Echocardiography 07/17/16 Study Conclusions  - Left ventricle: The cavity size was normal. There was mild   concentric hypertrophy. Systolic function was vigorous. The   estimated ejection fraction was in the range of 65% to 70%. Wall   motion was normal; there were no regional wall motion   abnormalities. - Mitral valve: Calcified annulus. There was moderate to severe   regurgitation directed centrally. - Left atrium: The atrium was moderately to severely dilated. - Right atrium: The atrium was moderately to severely dilated. - Tricuspid valve: There was mild-moderate regurgitation directed   centrally. - Pulmonary arteries: Systolic pressure was mildly increased. PA   peak  pressure: 36 mm Hg (S).   Patient Profile     Mr. Lee Allen is a 82 year old male with a past medical history of Hepatitis C, cirrhosis, and hepatocellular CA presents with atrial fibrillation with RVR, triggered during prolonged diarrheal illness,being treated for C-diff.   Assessment & Plan    1. Atrial fib: Rate controlled. Switched to PO diltiazem yesterday, continue digoxin daily. CHADSVasc 2(age only)and high bleeding risk. At most consider short term anticoagulation if we perform cardioversion.   MD to advise on DCCV.  2. Moderate to severe MR:may be underlying substrate for AF, but his EF is normal and I doubt he will be a surgical candidate. Was described as mild to moderate in 2014 on TEE. Mechanism unclear. Medical Rx. Note signs of hypervolemia on exam.  3. Hypokalemia: resolved  4. C.diff diarrhea  Signed, Lee Leas, NP  07/20/2016, 7:37 AM   I have seen and examined the patient along with Lee Leas, NP.  I have reviewed the chart, notes and new data.  I agree with NP's note.  Key new complaints: breathing and diarrhea both improved Key examination changes: marginal HR control. BP may allow a higher diltiazem dose, but not a whole lot of room, JVP down to 5-6 cm today. Lungs clear. Key new findings / data: K 4.0  PLAN: Start DOAC and plan TEE/DCCV on Friday, with 30-day anticoagulation afterwards (probably not a good candidate for lifelong anticoagulation due to liver disease). TEE will also allow better evaluation of MR mechanism and severity. This procedure has been fully reviewed with the patient and informed consent has been obtained.   Lee Klein, MD, [place] [phone] 07/20/2016, 3:20 PM

## 2016-07-20 NOTE — Discharge Instructions (Addendum)
Information on my medicine - ELIQUIS (apixaban)  This medication education was reviewed with me or my healthcare representative as part of my discharge preparation.  The pharmacist that spoke with me during my hospital stay was:  Wayland Salinas, [facility]  Why was Eliquis prescribed for you? Eliquis was prescribed for you to reduce the risk of a blood clot forming that can cause a stroke if you have a medical condition called atrial fibrillation (a type of irregular heartbeat).  What do You need to know about Eliquis ? Take your Eliquis TWICE DAILY - one tablet in the morning and one tablet in the evening with or without food. If you have difficulty swallowing the tablet whole please discuss with your pharmacist how to take the medication safely.  Take Eliquis exactly as prescribed by your doctor and DO NOT stop taking Eliquis without talking to the doctor who prescribed the medication.  Stopping may increase your risk of developing a stroke.  Refill your prescription before you run out.  After discharge, you should have regular check-up appointments with your healthcare provider that is prescribing your Eliquis.  In the future your dose may need to be changed if your kidney function or weight changes by a significant amount or as you get older.  What do you do if you miss a dose? If you miss a dose, take it as soon as you remember on the same day and resume taking twice daily.  Do not take more than one dose of ELIQUIS at the same time to make up a missed dose.  Important Safety Information A possible side effect of Eliquis is bleeding. You should call your healthcare provider right away if you experience any of the following: ? Bleeding from an injury or your nose that does not stop. ? Unusual colored urine (red or dark brown) or unusual colored stools (red or black). ? Unusual bruising for unknown reasons. ? A serious fall or if you hit your head (even if there is no  bleeding).  Some medicines may interact with Eliquis and might increase your risk of bleeding or clotting while on Eliquis. To help avoid this, consult your healthcare provider or pharmacist prior to using any new prescription or non-prescription medications, including herbals, vitamins, non-steroidal anti-inflammatory drugs (NSAIDs) and supplements.  This website has more information on Eliquis (apixaban): http://www.eliquis.com/eliquis/home   Follow-up Information    [facility] Follow up.   Why:  Physical Therapy Contact information: [place] [address] [phone]        Dorris Carnes, MD Follow up on 08/17/2016.   Specialty:  Cardiology Why:  at 9: 15 AM with Richardson Dopp, her PA Contact information: [place] [address] [phone]        [place] [facility] Follow up on 08/11/2016.   Specialty:  Cardiology Why:  at 4:00 PM for ultrasound of your heart.  Echocardiogram Contact information: [address] [place]       [name], PA-C. Schedule an appointment as soon as possible for a visit.   Specialty:  Physician Radio producer information: [address] [place] [address] [phone]

## 2016-07-21 LAB — CBC
HEMATOCRIT: 31.1 % — AB (ref 39.0–52.0)
Hemoglobin: 10.4 g/dL — ABNORMAL LOW (ref 13.0–17.0)
MCH: 31.7 pg (ref 26.0–34.0)
MCHC: 33.4 g/dL (ref 30.0–36.0)
MCV: 94.8 fL (ref 78.0–100.0)
PLATELETS: 103 10*3/uL — AB (ref 150–400)
RBC: 3.28 MIL/uL — ABNORMAL LOW (ref 4.22–5.81)
RDW: 15.7 % — AB (ref 11.5–15.5)
WBC: 6 10*3/uL (ref 4.0–10.5)

## 2016-07-21 LAB — RENAL FUNCTION PANEL
Albumin: 2.3 g/dL — ABNORMAL LOW (ref 3.5–5.0)
Anion gap: 7 (ref 5–15)
BUN: 18 mg/dL (ref 6–20)
CALCIUM: 8.5 mg/dL — AB (ref 8.9–10.3)
CO2: 21 mmol/L — AB (ref 22–32)
CREATININE: 0.86 mg/dL (ref 0.61–1.24)
Chloride: 109 mmol/L (ref 101–111)
GFR calc non Af Amer: >60 mL/min (ref >60)
Glucose, Bld: 108 mg/dL — ABNORMAL HIGH (ref 65–99)
Phosphorus: 3.1 mg/dL (ref 2.5–4.6)
Potassium: 4.5 mmol/L (ref 3.5–5.1)
SODIUM: 137 mmol/L (ref 135–145)

## 2016-07-21 LAB — STOOL CULTURE REFLEX - RSASHR

## 2016-07-21 LAB — STOOL CULTURE: E coli, Shiga toxin Assay: NEGATIVE

## 2016-07-21 LAB — MAGNESIUM: Magnesium: 2 mg/dL (ref 1.7–2.4)

## 2016-07-21 LAB — STOOL CULTURE REFLEX - CMPCXR

## 2016-07-21 MED ORDER — VANCOMYCIN HCL 125 MG PO CAPS: 125.0000 mg | ORAL_CAPSULE | Freq: Four times a day (QID) | ORAL | 0 refills | Status: AC

## 2016-07-21 MED FILL — VANCOMYCIN HCL 125 MG CAP: 125 | 7 days supply | Qty: 28 | Fill #0

## 2016-07-21 NOTE — Progress Notes (Signed)
Family Medicine Teaching Service Daily Progress Note Intern Pager: [phone]  Patient name: Lee Allen Medical record number: [id number] Date of birth: Sep 28, 1924 Age: 82 y.o. Gender: male  Primary Care Provider: [name], MD Consultants: Cardiology  Code Status: Full   Pt Overview and Major Events to Date:  12/1- admitted with Afib with RVR and diarrhea (C.diff)  Assessment and Plan: Lee Allen is a 82 y.o. male presenting with afib with RVR . PMH is significant for hx of afib, cirrhosis of liver due to hepatitis C, hepatocellular carcinoma, laminectomy (November 3rd, 2017), Dilated ascending aortic (4 cm) aneurysm.   Afib with RVR: Tachycardia improved. CHADVasc score 2 per age. Likely triggered by C.diff. Cardiology following. Heart rate 99-102 this morning -Cardiology following; appreciate recs, plan to do TEE with cardioversion 12/8 -Continue PO diltiazem and digoxin per cardiology -continue eliquis -Vitals per floor and pulse ox -continue to monitor on telemetry   C.Diff Colitis-improving: CT positive for colitis and proctitis. Positive for C.diff. 4 BM yesterday. -continue Vancomycin 125 mg q6hrs PO for total of 14 days -Strict I and Os -PT/OT recommending home health PT/OT with 24 hour supervision -family working on acquiring home health aid  Hypokalemia: K 2.9 on admission, goal per cardiology is to keep K over 4. This morning potassium 4.5 -Continue KDUR 40 BID orally -monitor daily -Mg and Phos within normal range, will monitor  AKI-resolved: Scr 1.47 >1.26>1.07>1.01>0.98>0.86 this morning. Baseline SCr 1.0. -Avoid nephrotoxic agents  -Continue to monitor  Anemia-stable. Hbg on admission 12.0. Baseline hard to discern but patient has trended between 7-12 over the last 2 years. Hemoglobin stable at 9.8 -continue to monitor -FOBT negative  Thrombocytopenia-stable: Platelets 133> 95>89>93, stable, appears to be at his baseline -Will continue to  follow  FEN/GI: Regular diet Prophylaxis: Lovenox   Disposition: plan for home with HHPT/OT, 24 hour supervision  Subjective:  Lee Allen is well today, he is resting comfortably. He is hoping he can go home this weekend after his procedure. He has no complaints at this time.  Objective: Temp:  [97.7 F (36.5 C)-98.9 F (37.2 C)] 98 F (36.7 C) (12/07 0357) Pulse Rate:  [76-102] 94 (12/07 0357) Resp:  [18-23] 22 (12/07 0357) BP: (102-116)/(70-81) 112/72 (12/07 0357) SpO2:  [93 %-97 %] 96 % (12/07 0357) Weight:  [177 lb 14.4 oz (80.7 kg)] 177 lb 14.4 oz (80.7 kg) (12/07 0357) Physical Exam: General: elderly gentleman in no acute distress, laying in bed Cardiovascular: Irregular rhythm, tachycardic, no murmurs rubs or gallops Respiratory: CTAB, no increased work of breathing Abdomen: BS+, soft, mild tenderness diffusely, mild distention  Extremities: No lower extremity edema or cyanosis  Laboratory:  Recent Labs Lab 07/17/16 0407 07/18/16 0443 07/19/16 0437  WBC 10.6* 11.2* 11.3*  HGB 9.9* 9.7* 9.8*  HCT 29.2* 29.2* 29.3*  PLT 95* 89* 93*    Recent Labs Lab 07/15/16 1256 07/16/16 0207  07/19/16 0437 07/20/16 0445 07/21/16 0327  NA 137 139  < > 132* 135 137  K 3.6 2.9*  < > 3.6 4.0 4.5  CL 105 110  < > 105 108 109  CO2 20* 21*  < > 22 23 21*  BUN 24* 21*  < > '19 18 18  '$ CREATININE 1.47* 1.26*  < > 1.01 0.98 0.86  CALCIUM 8.9 8.4*  < > 8.4* 8.5* 8.5*  PROT 6.1* 5.5*  --   --   --   --   BILITOT 4.1* 2.8*  --   --   --   --  ALKPHOS 44 38  --   --   --   --   ALT 23 21  --   --   --   --   AST 39 30  --   --   --   --   GLUCOSE 211* 135*  < > 132* 119* 108*  < > = values in this interval not displayed.  Imaging/Diagnostic Tests: Dg Chest 2 View  Result Date: 07/19/2016 CLINICAL DATA:  82 y/o  M; dyspnea. EXAM: CHEST  2 VIEW COMPARISON:  07/15/2016 chest radiograph FINDINGS: Stable borderline cardiomegaly given projection and technique. Low lung volumes  accentuate pulmonary markings. Patchy right basilar opacity probably represents atelectasis. No pleural effusion. No pneumothorax. Bones are unremarkable. IMPRESSION: Low lung volumes. Right basilar opacity is probably atelectasis. No acute pulmonary process. Electronically Signed   By: Kristine Garbe M.D.   On: 07/19/2016 20:35   -ECHO with LV EF: 65% - 70%, Wall motion was normal; there were no regional wall motion abnormalities.  Steve Rattler, DO 07/21/2016, 7:22 AM PGY-1, [place] Intern pager: [phone], text pages welcome

## 2016-07-21 NOTE — Progress Notes (Signed)
Patient complaining of sore throat this morning . No medication ordered for this.Paged on call MD at [phone] and [phone] awaiting response.

## 2016-07-21 NOTE — Progress Notes (Signed)
Patient Name: Lee Allen Date of Encounter: 07/21/2016  Primary Cardiologist: Dr. Ladonna Snide Problem List     Active Problems:   Atrial fibrillation with RVR (HCC)   A-fib ([place])   Acute kidney injury ([place])   Dehydration   Hepatocellular carcinoma ([place])   C. difficile colitis   Dyspnea   Mitral valve insufficiency     Subjective   Feels more tired today. Denies chest pain, SOB and palpitations.   Inpatient Medications    Scheduled Meds: . apixaban  5 mg Oral BID  . digoxin  0.125 mg Oral Daily  . diltiazem  360 mg Oral Daily  . potassium chloride  40 mEq Oral BID  . sodium chloride flush  3 mL Intravenous Q12H  . vancomycin  125 mg Oral Q6H   Continuous Infusions: . sodium chloride 20 mL/hr at 07/20/16 1256   PRN Meds: cholestyramine   Vital Signs    Vitals:   07/21/16 0008 07/21/16 0010 07/21/16 0357 07/21/16 0852  BP: 102/76 116/71 112/72 115/75  Pulse: 76  94 99  Resp: 19  (!) 22   Temp: 97.7 F (36.5 C)  98 F (36.7 C)   TempSrc: Oral  Oral   SpO2: 94%  96%   Weight:   177 lb 14.4 oz (80.7 kg)   Height:        Intake/Output Summary (Last 24 hours) at 07/21/16 1050 Last data filed at 07/21/16 0900  Gross per 24 hour  Intake           605.33 ml  Output              425 ml  Net           180.33 ml   Filed Weights   07/19/16 0502 07/20/16 0500 07/21/16 0357  Weight: 176 lb 14.4 oz (80.2 kg) 178 lb 9.6 oz (81 kg) 177 lb 14.4 oz (80.7 kg)    Physical Exam   GEN: Elderly male in no acute distress.  HEENT: Grossly normal.  Neck: Supple, minimal JVD, carotid bruits, or masses. Cardiac: irregularly irregular , 2/6 systolic murmur, No rubs, or gallops. No clubbing, cyanosis, edema.  Radials/DP/PT 2+ and equal bilaterally.  Respiratory:  Respirations regular and unlabored, diminished in bilateral lower lobes.  GI: Soft, nontender, nondistended, BS + x 4. MS: no deformity or atrophy. Skin: warm and dry, no rash. Neuro:  Strength and  sensation are intact. Psych: AAOx3.  Normal affect.  Labs    CBC  Recent Labs  07/19/16 0437 07/21/16 0842  WBC 11.3* 6.0  HGB 9.8* 10.4*  HCT 29.3* 31.1*  MCV 94.8 94.8  PLT 93* 409*   Basic Metabolic Panel  Recent Labs  07/20/16 0445 07/21/16 0327  NA 135 137  K 4.0 4.5  CL 108 109  CO2 23 21*  GLUCOSE 119* 108*  BUN 18 18  CREATININE 0.98 0.86  CALCIUM 8.5* 8.5*  MG 1.9 2.0  PHOS 3.2 3.1   Liver Function Tests  Recent Labs  07/20/16 0445 07/21/16 0327  ALBUMIN 2.2* 2.3*     Telemetry    Afib, rates in 80-90's  - Personally Reviewed    Radiology    Dg Chest 2 View  Result Date: 07/19/2016 CLINICAL DATA:  82 y/o  M; dyspnea. EXAM: CHEST  2 VIEW COMPARISON:  07/15/2016 chest radiograph FINDINGS: Stable borderline cardiomegaly given projection and technique. Low lung volumes accentuate pulmonary markings. Patchy right basilar opacity probably represents atelectasis.  No pleural effusion. No pneumothorax. Bones are unremarkable. IMPRESSION: Low lung volumes. Right basilar opacity is probably atelectasis. No acute pulmonary process. Electronically Signed   By: Kristine Garbe M.D.   On: 07/19/2016 20:35    Cardiac Studies   Transthoracic Echocardiography 07/17/16 Study Conclusions  - Left ventricle: The cavity size was normal. There was mild concentric hypertrophy. Systolic function was vigorous. The estimated ejection fraction was in the range of 65% to 70%. Wall motion was normal; there were no regional wall motion abnormalities. - Mitral valve: Calcified annulus. There was moderate to severe regurgitation directed centrally. - Left atrium: The atrium was moderately to severely dilated. - Right atrium: The atrium was moderately to severely dilated. - Tricuspid valve: There was mild-moderate regurgitation directed centrally. - Pulmonary arteries: Systolic pressure was mildly increased. PA peak pressure: 36 mm Hg  (S).   Patient Profile     Mr. Gudino is a 82 year old male with a past medical history of Hepatitis C, cirrhosis, and hepatocellular CA presents with atrial fibrillation with RVR, triggered during prolonged diarrheal illness,being treated for C-diff.   Assessment & Plan    1. Atrial fib: Rate controlled. On POdiltiazem, continue digoxindaily. CHADSVasc 2(age only)and high bleeding risk.  Plan for TEE/DCCV on Friday 07/22/16 with 30 day anticoagulation after, not a good candidate for lifelong anticoagulation with liver disease.    2. Moderate to severe YQ:MVHQ evaluate further by TEE.   3. Hypokalemia: resolved  4. C.diff diarrhea  Signed, Arbutus Leas, NP  07/21/2016, 10:50 AM   I have seen and examined the patient along with Arbutus Leas, NP .  I have reviewed the chart, notes and new data.  I agree with NP's note.  Key new complaints: fatigue, no dyspnea Key examination changes: AF with good rate control Key new findings / data: despite normal K and marked improvement in diarrhea, has persistent AF.  PLAN: DCCV after TEE  Tomorrow at 1PM. Hold dig tomorrow AM. Billy Fischer if DCCV fails and we still need it for rate control. He will have had 5 doses of Eliquis by then. This procedure has been fully reviewed with the patient and written informed consent has been obtained. Anticoagulation for 30 days,  Sanda Klein, MD, [facility] [phone] 07/21/2016, 1:45 PM

## 2016-07-21 NOTE — Discharge Summary (Signed)
[facility] Discharge Summary  Patient name: Lee Allen Medical record number: [id number] Date of birth: Jul 13, 1925 Age: 82 y.o. Gender: male Date of Admission: 07/15/2016  Date of Discharge: 07/23/16  Admitting Physician: Alveda Reasons, MD  Primary Care Provider: [name], PA-C Consultants: cardiology, PT/OT  Indication for Hospitalization: tachycardia  Discharge Diagnoses/Problem List:  Atrial fibrillation with RVR C. Dif colitis Weakness Hypokalemia  Disposition: home with HH PT/OT, 24 hour supervision  Discharge Condition: stable, improved  Discharge Exam: see progress note from 07/23/16  Brief Hospital Course:   Lee Allen is an 82 year old with a PMH of afib, cirrhosis of liver due to hepatitis C, hepatocellular carcinoma, laminectomy (November 3rd, 2017), and dilated ascending aortic(4 cm) aneurysm who presented with tachycardia to 120-130's and diarrhea. He was admitted to [facility] [place]. He was found to be positive for C. Dif and started on oral vancomycin with reduction in amount of bowel movements per day. He was noted to be in atrial fibrillation with RVR. Cardiology was consulted and managed him with diltiazem and digoxin. He was scheduled for TEE with cardioversion but spontaneously converted to sinus rhythm the morning of the procedure. He was evaluated by PT/OT who recommended home health and 24 hour supervision. He was discharged home in stable condition on 07/23/16.   Issues for Follow Up:  1. Ensure patient completes 14 day course of oral vancomycin for C. Dif colitis 2. Recheck BMP, monitor potassium 3. Eliquis for anticoagulation for 30 days only, poor lifelong candidate due to liver disease 4. Home with 24 hour supervision  Significant Procedures: none  Significant Labs and Imaging:   Recent Labs Lab 07/19/16 0437 07/21/16 0842 07/22/16 0417  WBC 11.3* 6.0 4.9  HGB 9.8* 10.4* 10.6*  HCT 29.3* 31.1* 31.8*  PLT 93*  103* 101*    Recent Labs Lab 07/19/16 0437 07/20/16 0445 07/21/16 0327 07/22/16 0417 07/23/16 0405  NA 132* 135 137 139 139  K 3.6 4.0 4.5 5.1 4.2  CL 105 108 109 109 106  CO2 22 23 21* 23 26  GLUCOSE 132* 119* 108* 114* 111*  BUN '19 18 18 15 15  '$ CREATININE 1.01 0.98 0.86 0.98 0.97  CALCIUM 8.4* 8.5* 8.5* 8.9 8.9  MG 1.8 1.9 2.0 1.8 2.0  PHOS 2.4* 3.2 3.1 3.1 4.0  ALBUMIN 2.3* 2.2* 2.3* 2.3* 2.3*    Results/Tests Pending at Time of Discharge: none  Discharge Medications:    Medication List    TAKE these medications   acetaminophen 325 MG tablet Commonly known as:  TYLENOL Take 650 mg by mouth every 6 (six) hours as needed.   apixaban 5 MG Tabs tablet Commonly known as:  ELIQUIS Take 1 tablet (5 mg total) by mouth 2 (two) times daily.   aspirin EC 81 MG tablet Take 81 mg by mouth daily as needed. FOR  CARDIOVASCULAR   cholestyramine 4 g packet Commonly known as:  QUESTRAN Take 1 packet by mouth daily as needed for itching.   diltiazem 360 MG 24 hr capsule Commonly known as:  CARDIZEM CD Take 1 capsule (360 mg total) by mouth daily. Start taking on:  07/24/2016   gabapentin 300 MG capsule Commonly known as:  NEURONTIN Take 300 mg by mouth 2 (two) times daily.   loperamide 2 MG tablet Commonly known as:  IMODIUM A-D Take 2 mg by mouth 4 (four) times daily as needed for diarrhea or loose stools.   naproxen sodium 220 MG tablet Commonly  known as:  ANAPROX Take 220 mg by mouth daily as needed (for pain).   vancomycin 125 MG capsule Commonly known as:  VANCOCIN Take 1 capsule (125 mg total) by mouth 4 (four) times daily.   vancomycin 50 mg/mL oral solution Commonly known as:  VANCOCIN Take 2.5 mLs (125 mg total) by mouth every 6 (six) hours.       Discharge Instructions: Please refer to Patient Instructions section of EMR for full details.  Patient was counseled important signs and symptoms that should prompt return to medical care, changes in  medications, dietary instructions, activity restrictions, and follow up appointments.   Follow-Up Appointments: Follow-up Information    [facility] Follow up.   Why:  Physical Therapy Contact information: [place] [address] [phone]        Dorris Carnes, MD Follow up on 08/17/2016.   Specialty:  Cardiology Why:  at 9: 15 AM with Richardson Dopp, her PA Contact information: [place] [address] [phone]        [place] [facility] Follow up on 08/11/2016.   Specialty:  Cardiology Why:  at 4:00 PM for ultrasound of your heart.  Echocardiogram Contact information: [address], [address] [employer] [place]       [name], PA-C. Schedule an appointment as soon as possible for a visit.   Specialty:  Physician Assistant Contact information: [place] [address] [phone]           Steve Rattler, DO 07/23/2016, 10:32 PM PGY-1, [place]

## 2016-07-21 NOTE — Care Management (Addendum)
2:30pm Case manager spoke with [facility] outpatient pharmacist concerning cost for Vancomycin Tablets- 113m tablet -4/day for 7 days will cost patient $123.00. Case manager contacted ALucila Maine Do and informed her of patient's out of pocket cost. Medication will be available for patient's family to pick[place] [facility]   2:26pm Case manager received information concerning benefits check. Patient will have to pay full price for Eliquis because he has not met deductible. After $137.00 deductible met his co-pay will be $34.00 per month. Prior aJosem Kaufmannis also required for this medication. CM spoke Dr. WRosalyn Gessand provided the number for prior authorization:[phone]. CM also spoke with patient's daughter and explained cost.  CM received call back from [facility] stating patient will only be on Eliquis for 30 days. CM will update patient's daughter that 30-day free card will cover his needs.

## 2016-07-22 ENCOUNTER — Encounter (HOSPITAL_COMMUNITY): Payer: Self-pay | Admitting: Certified Registered Nurse Anesthetist

## 2016-07-22 ENCOUNTER — Other Ambulatory Visit: Payer: Self-pay | Admitting: Cardiology

## 2016-07-22 ENCOUNTER — Encounter (HOSPITAL_COMMUNITY)
Admission: EM | Disposition: A | Discharge status: Home Health | Payer: Self-pay | Source: Home / Self Care | Attending: Family Medicine

## 2016-07-22 ENCOUNTER — Inpatient Hospital Stay (HOSPITAL_COMMUNITY): Payer: Medicare Other

## 2016-07-22 DIAGNOSIS — I4819 Other persistent atrial fibrillation: Secondary | ICD-10-CM

## 2016-07-22 DIAGNOSIS — R011 Cardiac murmur, unspecified: Secondary | ICD-10-CM

## 2016-07-22 LAB — CBC
HEMATOCRIT: 31.8 % — AB (ref 39.0–52.0)
HEMOGLOBIN: 10.6 g/dL — AB (ref 13.0–17.0)
MCH: 32 pg (ref 26.0–34.0)
MCHC: 33.3 g/dL (ref 30.0–36.0)
MCV: 96.1 fL (ref 78.0–100.0)
Platelets: 101 10*3/uL — ABNORMAL LOW (ref 150–400)
RBC: 3.31 MIL/uL — ABNORMAL LOW (ref 4.22–5.81)
RDW: 15.8 % — AB (ref 11.5–15.5)
WBC: 4.9 10*3/uL (ref 4.0–10.5)

## 2016-07-22 LAB — RENAL FUNCTION PANEL
Albumin: 2.3 g/dL — ABNORMAL LOW (ref 3.5–5.0)
Anion gap: 7 (ref 5–15)
BUN: 15 mg/dL (ref 6–20)
CHLORIDE: 109 mmol/L (ref 101–111)
CO2: 23 mmol/L (ref 22–32)
CREATININE: 0.98 mg/dL (ref 0.61–1.24)
Calcium: 8.9 mg/dL (ref 8.9–10.3)
GFR calc Af Amer: >60 mL/min (ref >60)
GLUCOSE: 114 mg/dL — AB (ref 65–99)
POTASSIUM: 5.1 mmol/L (ref 3.5–5.1)
Phosphorus: 3.1 mg/dL (ref 2.5–4.6)
Sodium: 139 mmol/L (ref 135–145)

## 2016-07-22 LAB — MAGNESIUM: Magnesium: 1.8 mg/dL (ref 1.7–2.4)

## 2016-07-22 SURGERY — CANCELLED PROCEDURE

## 2016-07-22 NOTE — Progress Notes (Signed)
Patient Name: Lee Allen Date of Encounter: 07/22/2016  Primary Cardiologist: [facility] Problem List     Active Problems:   Atrial fibrillation with RVR (HCC)   A-fib (HCC)   Acute kidney injury ([place])   Dehydration   Hepatocellular carcinoma (HCC)   C. difficile colitis   Dyspnea   Mitral valve insufficiency     Subjective   Feels much better. Converted to NSR spontaneously. Has had a normal, formed bowel movement.  Inpatient Medications    Scheduled Meds: . apixaban  5 mg Oral BID  . diltiazem  360 mg Oral Daily  . sodium chloride flush  3 mL Intravenous Q12H  . vancomycin  125 mg Oral Q6H   Continuous Infusions: . sodium chloride 20 mL/hr at 07/20/16 1256   PRN Meds: cholestyramine   Vital Signs    Vitals:   07/22/16 0010 07/22/16 0351 07/22/16 0750 07/22/16 0754  BP: 128/77 124/67 116/68   Pulse: 86 88 87   Resp:  20 (!) 23   Temp: 97.5 F (36.4 C) 97.9 F (36.6 C)  97.4 F (36.3 C)  TempSrc: Oral Oral  Oral  SpO2: 96% 98% 94%   Weight:  179 lb 12.8 oz (81.6 kg)    Height:        Intake/Output Summary (Last 24 hours) at 07/22/16 1112 Last data filed at 07/22/16 0754  Gross per 24 hour  Intake              440 ml  Output              650 ml  Net             -210 ml   Filed Weights   07/20/16 0500 07/21/16 0357 07/22/16 0351  Weight: 178 lb 9.6 oz (81 kg) 177 lb 14.4 oz (80.7 kg) 179 lb 12.8 oz (81.6 kg)    Physical Exam    GEN: Well nourished, well developed, in no acute distress.  HEENT: Grossly normal.  Neck: Supple, no JVD, carotid bruits, or masses. Cardiac: RRR with rare ectopy (PAC and PVC on monitor), no murmurs, rubs, or gallops. No clubbing, cyanosis, edema.  Radials/DP/PT 2+ and equal bilaterally.  Respiratory:  Respirations regular and unlabored, clear to auscultation bilaterally. GI: Soft, nontender, nondistended, BS + x 4. MS: no deformity or atrophy. Skin: warm and dry, no rash. Neuro:  Strength and sensation  are intact. Psych: AAOx3.  Normal affect.  Labs    CBC  Recent Labs  07/21/16 0842 07/22/16 0417  WBC 6.0 4.9  HGB 10.4* 10.6*  HCT 31.1* 31.8*  MCV 94.8 96.1  PLT 103* 323*   Basic Metabolic Panel  Recent Labs  07/21/16 0327 07/22/16 0417  NA 137 139  K 4.5 5.1  CL 109 109  CO2 21* 23  GLUCOSE 108* 114*  BUN 18 15  CREATININE 0.86 0.98  CALCIUM 8.5* 8.9  MG 2.0 1.8  PHOS 3.1 3.1   Liver Function Tests  Recent Labs  07/21/16 0327 07/22/16 0417  ALBUMIN 2.3* 2.3*     Telemetry    NSR w PAC and PVCs - Personally Reviewed  ECG    NSR, TWI V1-V2 - Personally Reviewed  Cardiac Studies   07/17/16 Echo - Left ventricle: The cavity size was normal. There was mild   concentric hypertrophy. Systolic function was vigorous. The   estimated ejection fraction was in the range of 65% to 70%. Wall   motion was  normal; there were no regional wall motion   abnormalities. - Mitral valve: Calcified annulus. There was moderate to severe   regurgitation directed centrally. - Left atrium: The atrium was moderately to severely dilated. - Right atrium: The atrium was moderately to severely dilated. - Tricuspid valve: There was mild-moderate regurgitation directed   centrally. - Pulmonary arteries: Systolic pressure was mildly increased. PA   peak pressure: 36 mm Hg (S).  Patient Profile     Lee Allen is a 82 year old male with a past medical history of Hepatitis C, cirrhosis, and hepatocellular CA presents with atrial fibrillation with RVR, triggered during prolonged C-diff. diarrhea, converted spontaneously to NSR.  Assessment & Plan    1. Atrial fib: Resolved, TEE/DCCV canceled. Keep on POdiltiazem, stop digoxin. CHADSVasc 2(age only)and high bleeding risk. 30 day anticoagulation only, not a good candidate for lifelong anticoagulation with liver disease. Can use 30-day free coupon. 2. Moderate to severe GA:YGEFUWT f/u transthoracic echo to reevaluate before  his f/u appt (we will schedule). 3. C.diff diarrhea: resolved, needs to complete a long course of oral vancomycin. 4. Hep C/cirrhosis/mild thrombocytopenia: increased bleeding potential, keep on DOAC only for 30-day high risk period after conversion to SR.   Signed, Sanda Klein, MD  07/22/2016, 11:12 AM

## 2016-07-22 NOTE — Interval H&P Note (Signed)
History and Physical Interval Note:  07/22/2016 9:25 AM  Lee Allen  has presented today for surgery, with the diagnosis of AFIB  The various methods of treatment have been discussed with the patient and family. After consideration of risks, benefits and other options for treatment, the patient has consented to  Procedure(s): CARDIOVERSION (N/A) TRANSESOPHAGEAL ECHOCARDIOGRAM (TEE) (N/A) as a surgical intervention .  The patient's history has been reviewed, patient examined, no change in status, stable for surgery.  I have reviewed the patient's chart and labs.  Questions were answered to the patient's satisfaction.     Dorris Carnes

## 2016-07-22 NOTE — Consult Note (Signed)
            Lee Allen CM Primary Care Navigator  07/22/2016  Lee Allen Feb 07, 1925 [id number]  Attempted to see patient again today. Was able to meet with patient at the bedside to identify possible discharge needs. Patient reports that increasing weakness for the past few days and ongoing diarrhea for the same amount of time had led to this admission.  Patient endorses Lee Allen with Lee Allen as his primary care provider.    Patient shared using Lee Allen in Lee Allen to obtain medications without problem for now.   Patient reports that daughter Lee Allen) manages his medications at home.   He mentioned being able to drive prior to admission. Daughter Lee Allen) will provide transportation to his doctors' appointments when discharged..  Patient states he lives alone but his two daughters Lee Allen and Lee Allen) will be providing care for him at home.   Discharge plan is home with home health services Lee Allen).  Patient voiced understanding to call primary care provider's office when he gets home, for a post discharge follow-up appointment within a week or sooner if needs arise. Patient letter provided as a reminder. Patient states will let his daughter know to call.  He denies further needs or concerns at this time.  For additional questions please contact:  Lee Allen, BSN, RN-BC Lee [facility] PRIMARY CARE Navigator Cell: [phone]

## 2016-07-22 NOTE — Progress Notes (Signed)
PT Cancellation Note  Patient Details Name: Lee Allen MRN: [id number] DOB: 1924-08-24   Cancelled Treatment:    Reason Eval/Treat Not Completed: Fatigue/lethargy limiting ability to participate; patient reports plans fo return home tomorrow.  Feels too "lazy" to participate at this time.  Assist to reposition in bed, will attempt another day if pt not d/c.    Reginia Naas 07/22/2016, 4:03 PM  Magda Kiel, [place] 07/22/2016

## 2016-07-22 NOTE — Care Management (Addendum)
Pt is tentative discharge tomorrow 07/23/16 - CM assessed pt, pt alert and oriented.  Pt has Eliquis card at bedside - CM once again explained free 30 day card and copay thereafter.  Pt states his daughters will be with him post discharge 24/7 as recommended.  Pt continues to refuse HHOT

## 2016-07-22 NOTE — Progress Notes (Signed)
Patient in [place].  EKG obtained to confirm.  Cecilie Kicks NP paged and notified.  Autumn RN in Endoscopy notified and she was to notify Anesthesia.  Sanda Linger

## 2016-07-22 NOTE — Progress Notes (Signed)
ANTICOAGULATION CONSULT NOTE - Follow Up Consult  Pharmacy Consult for Eliquis Indication: atrial fibrillation  Allergies  Allergen Reactions  . Penicillins Other (See Comments) and Dermatitis    Skin peeled from inside of mouth unknown    Patient Measurements: Height: 5' 7.5" (171.5 cm) Weight: 179 lb 12.8 oz (81.6 kg) IBW/kg (Calculated) : 67.25  Vital Signs: Temp: 97.4 F (36.3 C) (12/08 0754) Temp Source: Oral (12/08 0754) BP: 116/68 (12/08 0750) Pulse Rate: 87 (12/08 0750)  Labs:  Recent Labs  07/20/16 0445 07/21/16 0327 07/21/16 0842 07/22/16 0417  HGB  --   --  10.4* 10.6*  HCT  --   --  31.1* 31.8*  PLT  --   --  103* 101*  CREATININE 0.98 0.86  --  0.98    Estimated Creatinine Clearance: 52.8 mL/min (by C-G formula based on SCr of 0.98 mg/dL).  Assessment:  Anticoag: Afib (82 y/o, 81kg, Scr 0.98. Hgb 10.6 improved(12 on admit). Plts 101 (133 on admit). CHADSVasc 2 (age only) and high bleeding risk. Anticoagulation short-term. DCCV today  Goal of Therapy:  Therapeutic oral anticoagulation' Monitor platelets by anticoagulation protocol: Yes   Plan:  Eliquis '5mg'$  BID Plan for TEE/DCCV today with 30 day anticoagulation after Kdur d/c'd  Wayland Salinas 07/22/2016,8:49 AM

## 2016-07-22 NOTE — Progress Notes (Signed)
Family Medicine Teaching Service Daily Progress Note Intern Pager: [phone]  Patient name: Lee Allen Medical record number: [id number] Date of birth: 1924-09-10 Age: 82 y.o. Gender: male  Primary Care Provider: [name], MD Consultants: Cardiology  Code Status: Full   Pt Overview and Major Events to Date:  12/1- admitted with Afib with RVR and diarrhea (C.diff) 12/8 - NSR on EKG, TEE and DCCV canceled  Assessment and Plan: Lee Allen is a 82 y.o. male presenting with afib with RVR . PMH is significant for hx of afib, cirrhosis of liver due to hepatitis C, hepatocellular carcinoma, laminectomy (November 3rd, 2017), Dilated ascending aortic (4 cm) aneurysm.   Afib with RVR: Tachycardia improved. CHADVasc score 2 per age. Likely triggered by C.diff. Cardiology following. Heart rate 84-99 this morning -Cardiology following; appreciate recs, canceled TEE and DCCV due to NSR -Continue PO diltiazem per cardiology -continue eliquis -Vitals per floor and pulse ox  C.Diff Colitis-improving: CT positive for colitis and proctitis. Positive for C.diff. 3 BM yesterday. -continue Vancomycin 125 mg q6hrs PO for total of 14 days (end date 12/16) -Strict I and Os -PT/OT recommending home health PT/OT with 24 hour supervision -family working on acquiring home health aid  Hypokalemia: K 2.9 on admission, goal per cardiology is to keep K over 4. This morning potassium 5.1 -monitor daily -Mg and Phos within normal range, will monitor  AKI-resolved: Scr 1.47 >0.98 this morning. Baseline SCr 1.0. -Avoid nephrotoxic agents  -Continue to monitor  Anemia-stable. Hbg on admission 12.0. Baseline hard to discern but patient has trended between 7-12 over the last 2 years. Hemoglobin stable at 10.6 -continue to monitor -FOBT negative  Thrombocytopenia-stable: Platelets 133> 101, stable, appears to be at his baseline -Will continue to follow  FEN/GI: Regular diet Prophylaxis:  Lovenox   Disposition: plan for home with HHPT/OT, 24 hour supervision  Subjective:  Patient feels well this morning. Spontaneously converted to NSR this morning.   Objective: Temp:  [97.5 F (36.4 C)-98.3 F (36.8 C)] 97.9 F (36.6 C) (12/08 0351) Pulse Rate:  [84-99] 88 (12/08 0351) Resp:  [20] 20 (12/08 0351) BP: (112-128)/(67-77) 124/67 (12/08 0351) SpO2:  [94 %-98 %] 98 % (12/08 0351) Weight:  [179 lb 12.8 oz (81.6 kg)] 179 lb 12.8 oz (81.6 kg) (12/08 0351) Physical Exam: General: elderly gentleman in no acute distress, laying in bed Cardiovascular: Irregular rhythm, tachycardic, no murmurs rubs or gallops Respiratory: CTAB, no increased work of breathing Abdomen: BS+, soft, mild tenderness diffusely, mild distention  Extremities: No lower extremity edema or cyanosis  Laboratory:  Recent Labs Lab 07/19/16 0437 07/21/16 0842 07/22/16 0417  WBC 11.3* 6.0 4.9  HGB 9.8* 10.4* 10.6*  HCT 29.3* 31.1* 31.8*  PLT 93* 103* 101*    Recent Labs Lab 07/15/16 1256 07/16/16 0207  07/20/16 0445 07/21/16 0327 07/22/16 0417  NA 137 139  < > 135 137 139  K 3.6 2.9*  < > 4.0 4.5 5.1  CL 105 110  < > 108 109 109  CO2 20* 21*  < > 23 21* 23  BUN 24* 21*  < > '18 18 15  '$ CREATININE 1.47* 1.26*  < > 0.98 0.86 0.98  CALCIUM 8.9 8.4*  < > 8.5* 8.5* 8.9  PROT 6.1* 5.5*  --   --   --   --   BILITOT 4.1* 2.8*  --   --   --   --   ALKPHOS 44 38  --   --   --   --  ALT 23 21  --   --   --   --   AST 39 30  --   --   --   --   GLUCOSE 211* 135*  < > 119* 108* 114*  < > = values in this interval not displayed.  Imaging/Diagnostic Tests: No results found. -ECHO with LV EF: 65% - 70%, Wall motion was normal; there were no regional wall motion abnormalities.  Sela Hilding, MD 07/22/2016, 7:07 AM PGY-1, [place] Intern pager: [phone], text pages welcome

## 2016-07-22 NOTE — H&P (View-Only) (Signed)
Patient Name: Lee Allen Date of Encounter: 07/21/2016  Primary Cardiologist: Dr. Ladonna Snide Problem List     Active Problems:   Atrial fibrillation with RVR (HCC)   A-fib ([place])   Acute kidney injury ([place])   Dehydration   Hepatocellular carcinoma ([place])   C. difficile colitis   Dyspnea   Mitral valve insufficiency     Subjective   Feels more tired today. Denies chest pain, SOB and palpitations.   Inpatient Medications    Scheduled Meds: . apixaban  5 mg Oral BID  . digoxin  0.125 mg Oral Daily  . diltiazem  360 mg Oral Daily  . potassium chloride  40 mEq Oral BID  . sodium chloride flush  3 mL Intravenous Q12H  . vancomycin  125 mg Oral Q6H   Continuous Infusions: . sodium chloride 20 mL/hr at 07/20/16 1256   PRN Meds: cholestyramine   Vital Signs    Vitals:   07/21/16 0008 07/21/16 0010 07/21/16 0357 07/21/16 0852  BP: 102/76 116/71 112/72 115/75  Pulse: 76  94 99  Resp: 19  (!) 22   Temp: 97.7 F (36.5 C)  98 F (36.7 C)   TempSrc: Oral  Oral   SpO2: 94%  96%   Weight:   177 lb 14.4 oz (80.7 kg)   Height:        Intake/Output Summary (Last 24 hours) at 07/21/16 1050 Last data filed at 07/21/16 0900  Gross per 24 hour  Intake           605.33 ml  Output              425 ml  Net           180.33 ml   Filed Weights   07/19/16 0502 07/20/16 0500 07/21/16 0357  Weight: 176 lb 14.4 oz (80.2 kg) 178 lb 9.6 oz (81 kg) 177 lb 14.4 oz (80.7 kg)    Physical Exam   GEN: Elderly male in no acute distress.  HEENT: Grossly normal.  Neck: Supple, minimal JVD, carotid bruits, or masses. Cardiac: irregularly irregular , 2/6 systolic murmur, No rubs, or gallops. No clubbing, cyanosis, edema.  Radials/DP/PT 2+ and equal bilaterally.  Respiratory:  Respirations regular and unlabored, diminished in bilateral lower lobes.  GI: Soft, nontender, nondistended, BS + x 4. MS: no deformity or atrophy. Skin: warm and dry, no rash. Neuro:  Strength and  sensation are intact. Psych: AAOx3.  Normal affect.  Labs    CBC  Recent Labs  07/19/16 0437 07/21/16 0842  WBC 11.3* 6.0  HGB 9.8* 10.4*  HCT 29.3* 31.1*  MCV 94.8 94.8  PLT 93* 893*   Basic Metabolic Panel  Recent Labs  07/20/16 0445 07/21/16 0327  NA 135 137  K 4.0 4.5  CL 108 109  CO2 23 21*  GLUCOSE 119* 108*  BUN 18 18  CREATININE 0.98 0.86  CALCIUM 8.5* 8.5*  MG 1.9 2.0  PHOS 3.2 3.1   Liver Function Tests  Recent Labs  07/20/16 0445 07/21/16 0327  ALBUMIN 2.2* 2.3*     Telemetry    Afib, rates in 80-90's  - Personally Reviewed    Radiology    Dg Chest 2 View  Result Date: 07/19/2016 CLINICAL DATA:  82 y/o  M; dyspnea. EXAM: CHEST  2 VIEW COMPARISON:  07/15/2016 chest radiograph FINDINGS: Stable borderline cardiomegaly given projection and technique. Low lung volumes accentuate pulmonary markings. Patchy right basilar opacity probably represents atelectasis.  No pleural effusion. No pneumothorax. Bones are unremarkable. IMPRESSION: Low lung volumes. Right basilar opacity is probably atelectasis. No acute pulmonary process. Electronically Signed   By: Kristine Garbe M.D.   On: 07/19/2016 20:35    Cardiac Studies   Transthoracic Echocardiography 07/17/16 Study Conclusions  - Left ventricle: The cavity size was normal. There was mild concentric hypertrophy. Systolic function was vigorous. The estimated ejection fraction was in the range of 65% to 70%. Wall motion was normal; there were no regional wall motion abnormalities. - Mitral valve: Calcified annulus. There was moderate to severe regurgitation directed centrally. - Left atrium: The atrium was moderately to severely dilated. - Right atrium: The atrium was moderately to severely dilated. - Tricuspid valve: There was mild-moderate regurgitation directed centrally. - Pulmonary arteries: Systolic pressure was mildly increased. PA peak pressure: 36 mm Hg  (S).   Patient Profile     Mr. Lee Allen is a 82 year old male with a past medical history of Hepatitis C, cirrhosis, and hepatocellular CA presents with atrial fibrillation with RVR, triggered during prolonged diarrheal illness,being treated for C-diff.   Assessment & Plan    1. Atrial fib: Rate controlled. On POdiltiazem, continue digoxindaily. CHADSVasc 2(age only)and high bleeding risk.  Plan for TEE/DCCV on Friday 07/22/16 with 30 day anticoagulation after, not a good candidate for lifelong anticoagulation with liver disease.    2. Moderate to severe LY:YTKP evaluate further by TEE.   3. Hypokalemia: resolved  4. C.diff diarrhea  Signed, Arbutus Leas, NP  07/21/2016, 10:50 AM   I have seen and examined the patient along with Arbutus Leas, NP .  I have reviewed the chart, notes and new data.  I agree with NP's note.  Key new complaints: fatigue, no dyspnea Key examination changes: AF with good rate control Key new findings / data: despite normal K and marked improvement in diarrhea, has persistent AF.  PLAN: DCCV after TEE  Tomorrow at 1PM. Hold dig tomorrow AM. Lee Allen if DCCV fails and we still need it for rate control. He will have had 5 doses of Eliquis by then. This procedure has been fully reviewed with the patient and written informed consent has been obtained. Anticoagulation for 30 days,  Sanda Klein, MD, [facility] [phone] 07/21/2016, 1:45 PM

## 2016-07-23 DIAGNOSIS — R778 Other specified abnormalities of plasma proteins: Secondary | ICD-10-CM

## 2016-07-23 DIAGNOSIS — R7989 Other specified abnormal findings of blood chemistry: Secondary | ICD-10-CM

## 2016-07-23 LAB — RENAL FUNCTION PANEL
Albumin: 2.3 g/dL — ABNORMAL LOW (ref 3.5–5.0)
Anion gap: 7 (ref 5–15)
BUN: 15 mg/dL (ref 6–20)
CHLORIDE: 106 mmol/L (ref 101–111)
CO2: 26 mmol/L (ref 22–32)
CREATININE: 0.97 mg/dL (ref 0.61–1.24)
Calcium: 8.9 mg/dL (ref 8.9–10.3)
Glucose, Bld: 111 mg/dL — ABNORMAL HIGH (ref 65–99)
PHOSPHORUS: 4 mg/dL (ref 2.5–4.6)
Potassium: 4.2 mmol/L (ref 3.5–5.1)
Sodium: 139 mmol/L (ref 135–145)

## 2016-07-23 LAB — MAGNESIUM: MAGNESIUM: 2 mg/dL (ref 1.7–2.4)

## 2016-07-23 MED ORDER — DILTIAZEM HCL ER COATED BEADS 360 MG PO CP24: 360.0000 mg | ORAL_CAPSULE | Freq: Every day | ORAL | 1 refills | Status: AC

## 2016-07-23 MED ORDER — VANCOMYCIN 50 MG/ML ORAL SOLUTION: 125.0000 mg | Freq: Four times a day (QID) | ORAL | 0 refills | Status: AC

## 2016-07-23 MED ORDER — APIXABAN 5 MG PO TABS: 5.0000 mg | ORAL_TABLET | Freq: Two times a day (BID) | ORAL | 0 refills | Status: AC

## 2016-07-23 NOTE — Progress Notes (Signed)
Family Medicine Teaching Service Daily Progress Note Intern Pager: [phone]  Patient name: Lee Allen Medical record number: [id number] Date of birth: 1924-11-24 Age: 82 y.o. Gender: male  Primary Care Provider: [name], PA-C Consultants: Cardiology  Code Status: Full   Pt Overview and Major Events to Date:  12/1- admitted with Afib with RVR and diarrhea (C.diff) 12/8 - NSR on EKG, TEE and DCCV canceled  Assessment and Plan: Lee Allen is a 82 y.o. male presenting with afib with RVR . PMH is significant for hx of afib, cirrhosis of liver due to hepatitis C, hepatocellular carcinoma, laminectomy (November 3rd, 2017), Dilated ascending aortic (4 cm) aneurysm.   Afib with RVR: Tachycardia improved now in sinus. CHADVasc score 2 per age. Likely triggered by C.diff. Cardiology following. Heart rate 75-91 this morning -Cardiology following; appreciate recs, canceled TEE and DCCV due to NSR -Continue PO diltiazem per cardiology -continue eliquis x 30 days, will use coupon -Vitals per floor and pulse ox  C.Diff Colitis-improving: CT positive for colitis and proctitis. Positive for C.diff. 3 BM yesterday. -continue Vancomycin 125 mg q6hrs PO for total of 14 days (end date 12/16) -Strict I and Os -PT/OT recommending home health PT/OT with 24 hour supervision -family working on acquiring home health aid  Hypokalemia: K 2.9 on admission, goal per cardiology is to keep K over 4. This morning potassium 4.2 -monitor daily -Mg and Phos within normal range, will monitor  AKI-resolved: Scr 1.47 >0.98 this morning. Baseline SCr 1.0. -Avoid nephrotoxic agents  -Continue to monitor  Anemia-stable. Hbg on admission 12.0. Baseline hard to discern but patient has trended between 7-12 over the last 2 years. Hemoglobin stable at 10.6 -continue to monitor -FOBT negative  Thrombocytopenia-stable: Platelets 133> 101, stable, appears to be at his baseline -Will continue to  follow  FEN/GI: Regular diet Prophylaxis: Lovenox   Disposition: plan for home with HHPT/OT, 24 hour supervision,   Subjective:  Patient feels well this morning. No chest pain or SOB. Last stool 2 days ago Objective: Temp:  [97.5 F (36.4 C)-98.2 F (36.8 C)] 97.5 F (36.4 C) (12/09 0700) Pulse Rate:  [72-91] 75 (12/09 0700) Resp:  [20-29] 22 (12/09 0700) BP: (111-135)/(64-81) 111/67 (12/09 0700) SpO2:  [93 %-98 %] 95 % (12/09 0700) Weight:  [174 lb 12.8 oz (79.3 kg)] 174 lb 12.8 oz (79.3 kg) (12/09 0500) Physical Exam: General: elderly gentleman in no acute distress, laying in bed Cardiovascular:RRR, no murmurs rubs or gallops Respiratory: CTAB, no increased work of breathing Abdomen: BS+, soft, mild tenderness diffusely, mild distention  Extremities: No lower extremity edema or cyanosis  Laboratory:  Recent Labs Lab 07/19/16 0437 07/21/16 0842 07/22/16 0417  WBC 11.3* 6.0 4.9  HGB 9.8* 10.4* 10.6*  HCT 29.3* 31.1* 31.8*  PLT 93* 103* 101*    Recent Labs Lab 07/21/16 0327 07/22/16 0417 07/23/16 0405  NA 137 139 139  K 4.5 5.1 4.2  CL 109 109 106  CO2 21* 23 26  BUN '18 15 15  '$ CREATININE 0.86 0.98 0.97  CALCIUM 8.5* 8.9 8.9  GLUCOSE 108* 114* 111*    Imaging/Diagnostic Tests: No results found. -ECHO with LV EF: 65% - 70%, Wall motion was normal; there were no regional wall motion abnormalities.  Veatrice Bourbon, MD 07/23/2016, 8:34 AM PGY-3, [place] Intern pager: [phone], text pages welcome

## 2016-07-23 NOTE — Progress Notes (Signed)
The client was given discharge instructions along with his daughter [name]. They were given education on A-fib and stoke precautions. New medications were sent to [facility] for them to pick up. A medication list was given for what to take for the rest of the day and how to take all medications. Follow up appointment times and dates given along with a number for appointment that needs to be made. Telemetry has been removed and CCMD has been notified.    Saddie Benders RN

## 2016-07-26 DIAGNOSIS — K7460 Unspecified cirrhosis of liver: Secondary | ICD-10-CM | POA: Diagnosis not present

## 2016-07-26 DIAGNOSIS — I4891 Unspecified atrial fibrillation: Secondary | ICD-10-CM | POA: Diagnosis not present

## 2016-07-26 DIAGNOSIS — B1920 Unspecified viral hepatitis C without hepatic coma: Secondary | ICD-10-CM | POA: Diagnosis not present

## 2016-07-26 DIAGNOSIS — Z7901 Long term (current) use of anticoagulants: Secondary | ICD-10-CM | POA: Diagnosis not present

## 2016-07-26 DIAGNOSIS — A0472 Enterocolitis due to Clostridium difficile, not specified as recurrent: Secondary | ICD-10-CM | POA: Diagnosis not present

## 2016-07-26 DIAGNOSIS — C220 Liver cell carcinoma: Secondary | ICD-10-CM | POA: Diagnosis not present

## 2016-07-28 ENCOUNTER — Encounter: Payer: Self-pay | Admitting: Physician Assistant

## 2016-07-28 DIAGNOSIS — C220 Liver cell carcinoma: Secondary | ICD-10-CM | POA: Diagnosis not present

## 2016-07-28 DIAGNOSIS — A0472 Enterocolitis due to Clostridium difficile, not specified as recurrent: Secondary | ICD-10-CM | POA: Diagnosis not present

## 2016-07-28 DIAGNOSIS — K7460 Unspecified cirrhosis of liver: Secondary | ICD-10-CM | POA: Diagnosis not present

## 2016-07-28 DIAGNOSIS — Z7901 Long term (current) use of anticoagulants: Secondary | ICD-10-CM | POA: Diagnosis not present

## 2016-07-28 DIAGNOSIS — B1920 Unspecified viral hepatitis C without hepatic coma: Secondary | ICD-10-CM | POA: Diagnosis not present

## 2016-07-28 DIAGNOSIS — I4891 Unspecified atrial fibrillation: Secondary | ICD-10-CM | POA: Diagnosis not present

## 2016-07-29 DIAGNOSIS — C7951 Secondary malignant neoplasm of bone: Secondary | ICD-10-CM | POA: Diagnosis not present

## 2016-07-29 DIAGNOSIS — C801 Malignant (primary) neoplasm, unspecified: Secondary | ICD-10-CM | POA: Diagnosis not present

## 2016-08-01 ENCOUNTER — Emergency Department (HOSPITAL_COMMUNITY): Payer: Medicare Other

## 2016-08-01 ENCOUNTER — Inpatient Hospital Stay (HOSPITAL_COMMUNITY)
Admission: EM | Admit: 2016-08-01 | Discharge: 2016-08-12 | DRG: 372 | Disposition: A | Discharge status: Hospice | Payer: Medicare Other | Attending: Family Medicine | Admitting: Family Medicine

## 2016-08-01 ENCOUNTER — Encounter (HOSPITAL_COMMUNITY): Payer: Self-pay

## 2016-08-01 DIAGNOSIS — R188 Other ascites: Secondary | ICD-10-CM | POA: Diagnosis not present

## 2016-08-01 DIAGNOSIS — Z66 Do not resuscitate: Secondary | ICD-10-CM | POA: Diagnosis not present

## 2016-08-01 DIAGNOSIS — M545 Low back pain, unspecified: Secondary | ICD-10-CM

## 2016-08-01 DIAGNOSIS — D649 Anemia, unspecified: Secondary | ICD-10-CM | POA: Diagnosis present

## 2016-08-01 DIAGNOSIS — Z515 Encounter for palliative care: Secondary | ICD-10-CM | POA: Diagnosis not present

## 2016-08-01 DIAGNOSIS — R140 Abdominal distension (gaseous): Secondary | ICD-10-CM

## 2016-08-01 DIAGNOSIS — R109 Unspecified abdominal pain: Secondary | ICD-10-CM

## 2016-08-01 DIAGNOSIS — M25552 Pain in left hip: Secondary | ICD-10-CM

## 2016-08-01 DIAGNOSIS — R5383 Other fatigue: Secondary | ICD-10-CM | POA: Diagnosis not present

## 2016-08-01 DIAGNOSIS — D696 Thrombocytopenia, unspecified: Secondary | ICD-10-CM | POA: Diagnosis present

## 2016-08-01 DIAGNOSIS — C7972 Secondary malignant neoplasm of left adrenal gland: Secondary | ICD-10-CM | POA: Diagnosis present

## 2016-08-01 DIAGNOSIS — R197 Diarrhea, unspecified: Secondary | ICD-10-CM

## 2016-08-01 DIAGNOSIS — K7460 Unspecified cirrhosis of liver: Secondary | ICD-10-CM | POA: Diagnosis present

## 2016-08-01 DIAGNOSIS — R1084 Generalized abdominal pain: Secondary | ICD-10-CM | POA: Diagnosis present

## 2016-08-01 DIAGNOSIS — Z7901 Long term (current) use of anticoagulants: Secondary | ICD-10-CM

## 2016-08-01 DIAGNOSIS — I340 Nonrheumatic mitral (valve) insufficiency: Secondary | ICD-10-CM | POA: Diagnosis present

## 2016-08-01 DIAGNOSIS — C3490 Malignant neoplasm of unspecified part of unspecified bronchus or lung: Secondary | ICD-10-CM

## 2016-08-01 DIAGNOSIS — R0602 Shortness of breath: Secondary | ICD-10-CM | POA: Diagnosis not present

## 2016-08-01 DIAGNOSIS — C220 Liver cell carcinoma: Secondary | ICD-10-CM | POA: Diagnosis not present

## 2016-08-01 DIAGNOSIS — B1920 Unspecified viral hepatitis C without hepatic coma: Secondary | ICD-10-CM | POA: Diagnosis present

## 2016-08-01 DIAGNOSIS — A0472 Enterocolitis due to Clostridium difficile, not specified as recurrent: Secondary | ICD-10-CM | POA: Diagnosis not present

## 2016-08-01 DIAGNOSIS — C7951 Secondary malignant neoplasm of bone: Secondary | ICD-10-CM | POA: Diagnosis present

## 2016-08-01 DIAGNOSIS — E46 Unspecified protein-calorie malnutrition: Secondary | ICD-10-CM | POA: Diagnosis present

## 2016-08-01 DIAGNOSIS — I4891 Unspecified atrial fibrillation: Secondary | ICD-10-CM | POA: Diagnosis present

## 2016-08-01 DIAGNOSIS — Z87891 Personal history of nicotine dependence: Secondary | ICD-10-CM

## 2016-08-01 DIAGNOSIS — Z7189 Other specified counseling: Secondary | ICD-10-CM

## 2016-08-01 DIAGNOSIS — D509 Iron deficiency anemia, unspecified: Secondary | ICD-10-CM | POA: Diagnosis present

## 2016-08-01 DIAGNOSIS — Z8505 Personal history of malignant neoplasm of liver: Secondary | ICD-10-CM

## 2016-08-01 DIAGNOSIS — K219 Gastro-esophageal reflux disease without esophagitis: Secondary | ICD-10-CM | POA: Diagnosis present

## 2016-08-01 DIAGNOSIS — R64 Cachexia: Secondary | ICD-10-CM

## 2016-08-01 DIAGNOSIS — A0471 Enterocolitis due to Clostridium difficile, recurrent: Principal | ICD-10-CM | POA: Diagnosis present

## 2016-08-01 DIAGNOSIS — C61 Malignant neoplasm of prostate: Secondary | ICD-10-CM

## 2016-08-01 DIAGNOSIS — M25559 Pain in unspecified hip: Secondary | ICD-10-CM

## 2016-08-01 DIAGNOSIS — I2510 Atherosclerotic heart disease of native coronary artery without angina pectoris: Secondary | ICD-10-CM | POA: Diagnosis present

## 2016-08-01 DIAGNOSIS — Z6827 Body mass index (BMI) 27.0-27.9, adult: Secondary | ICD-10-CM

## 2016-08-01 DIAGNOSIS — R530 Neoplastic (malignant) related fatigue: Secondary | ICD-10-CM

## 2016-08-01 DIAGNOSIS — I4892 Unspecified atrial flutter: Secondary | ICD-10-CM | POA: Diagnosis present

## 2016-08-01 LAB — CBC
HCT: 29.8 % — ABNORMAL LOW (ref 39.0–52.0)
Hemoglobin: 9.7 g/dL — ABNORMAL LOW (ref 13.0–17.0)
MCH: 31.2 pg (ref 26.0–34.0)
MCHC: 32.6 g/dL (ref 30.0–36.0)
MCV: 95.8 fL (ref 78.0–100.0)
PLATELETS: 92 10*3/uL — AB (ref 150–400)
RBC: 3.11 MIL/uL — AB (ref 4.22–5.81)
RDW: 16.1 % — ABNORMAL HIGH (ref 11.5–15.5)
WBC: 4.9 10*3/uL (ref 4.0–10.5)

## 2016-08-01 LAB — COMPREHENSIVE METABOLIC PANEL
ALK PHOS: 65 U/L (ref 38–126)
ALT: 55 U/L (ref 17–63)
AST: 71 U/L — ABNORMAL HIGH (ref 15–41)
Albumin: 2.6 g/dL — ABNORMAL LOW (ref 3.5–5.0)
Anion gap: 7 (ref 5–15)
BILIRUBIN TOTAL: 2 mg/dL — AB (ref 0.3–1.2)
BUN: 28 mg/dL — ABNORMAL HIGH (ref 6–20)
CALCIUM: 9.2 mg/dL (ref 8.9–10.3)
CO2: 25 mmol/L (ref 22–32)
CREATININE: 1.12 mg/dL (ref 0.61–1.24)
Chloride: 110 mmol/L (ref 101–111)
GFR calc non Af Amer: 56 mL/min — ABNORMAL LOW (ref >60)
Glucose, Bld: 114 mg/dL — ABNORMAL HIGH (ref 65–99)
Potassium: 3.8 mmol/L (ref 3.5–5.1)
SODIUM: 142 mmol/L (ref 135–145)
TOTAL PROTEIN: 6.6 g/dL (ref 6.5–8.1)

## 2016-08-01 LAB — I-STAT CG4 LACTIC ACID, ED
LACTIC ACID, VENOUS: 1.85 mmol/L (ref 0.5–1.9)
LACTIC ACID, VENOUS: 1.91 mmol/L — AB (ref 0.5–1.9)

## 2016-08-01 LAB — LIPASE, BLOOD: Lipase: 30 U/L (ref 11–51)

## 2016-08-01 MED ORDER — SODIUM CHLORIDE 0.9 % IV BOLUS (SEPSIS)
1000.0000 mL | Freq: Once | INTRAVENOUS | Status: AC
  Administered 2016-08-01: 1000 mL via INTRAVENOUS

## 2016-08-01 NOTE — ED Triage Notes (Signed)
Patient recently discharged from hospital after admission and treatment for C-diff. Reports that he developed abdominal soreness and diarrhea last pm. Complains of ongoing weakness, has finished all meds as prescribed at discharge

## 2016-08-01 NOTE — ED Notes (Signed)
Patient transported to X-ray 

## 2016-08-01 NOTE — H&P (Signed)
[facility] Admission History and Physical Service Pager: [phone]  Patient name: Lee Allen Medical record number: [id number] Date of birth: 1924/10/07 Age: 82 y.o. Gender: male  Primary Care Provider: [name], PA-C Consultants: none Code Status: Full  Chief Complaint: abdominal pain  Assessment and Plan: Lee Allen is a 82 y.o. male presenting with abdominal pain. PMH is significant for recent C. Dif colitis, a. Fib with RVR, cirrhosis due to hepatitis C, hepatocellular carcinoma  Abdominal Pain, likely new ascites- has had abdominal discomfort and distention since Sunday 12/17 when he told his daughter his pants wouldn't fit. Is followed at [facility] by oncology every 3 months. Has never had ascites before. Bedside US performed by EDP demonstrates pockets of fluid. Patient is afebrile. WBC 4.9. Lactic acid 1.85, 1.91. Concern for new ascites vs distention and pain due to C. Dif colitis. Less likely SBP given normal white count and lack of fever.  -admit to med-surg, attending Dr. Mingo Amber -vitals per floor -hold off on SBP prophylaxis for now -consult to IR for paracentesis tomorrow morning -fluid studies including cell count, culture -held cholestyramine for now -am CBC, BMP -Check KUB   C. Dif initial recurrence- recently diagnosed with new C. Dif colitis and treated with 14 day course of oral vancomycin. Last dose of vanc Saturday 12/16. Had 5-7 episodes of diarrhea day of admission. Repeat Cdiff not checked as it would not change management.  -restart patient on oral vancomycin for initial recurrence of C. Dif -can increase dose of vanc or add flagyl depending on his response -IV fluids to rehydrate -contact precautions  Anemia. Hgb 9.7 on admission. Baseline 9 to 10. Also with thrombocytopenia. Likely secondary to cirrhosis. -Continue to monitor.   A fib with history of RVR- on diltiazem and eliquis at home. Normal HR in ED. Chadsvasc  score of at least 2. -continue home anticoagulation with eliquis -continue home diltiazem  Hepatocellular Carcinoma- Medical oncologist Dr. Shirleen Schirmer at [facility] Not receiving any chemotherapy currently. 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 CT abdomen/pelvis with New 2 x 3.2 cm LEFT adrenal mass, with thickened RIGHT adrenal gland, in the setting of cancer this is most compatible with new metastasis -may need to contact oncologist -held home cholestyramine   FEN/GI: regular diet, NS '@75cc'$ /hr Prophylaxis: on eliquis  Disposition: pending clinical improvement  History of Present Illness:  Lee Allen is a 82 y.o. male presenting with abdominal pain.  His daughter is at bedside and provides some history. He was recently admitted due to severe diarrhea and completed a 14 day course of vancomycin for C. Dif colitis. He finished the vancomycin on Saturday. On Sunday he felt his pants were too tight to button over his stomach and that it was larger than normal. He has had soreness in his abdomen. Last night he had 2 episodes of diarrhea. This morning PT came and he was too sore in his stomach to work with him. He had another 5 bowel movements prompting his home health aid to call his daughter and bring him to ED. He denies fevers and chills. His stomach is sore but he denies other complaints.   Review Of Systems: Per HPI with the following additions:  Review of Systems  Constitutional: Negative for chills and fever.  Respiratory: Negative for cough and shortness of breath.   Cardiovascular: Negative for chest pain and palpitations.  Gastrointestinal: Positive for abdominal pain and diarrhea. Negative for nausea and vomiting.  Skin:  Positive for rash.  Neurological: Negative for focal weakness.  Psychiatric/Behavioral: Negative for substance abuse.    Patient Active Problem List   Diagnosis Date Noted  . Elevated troponin   . Dyspnea   . Mitral valve  insufficiency   . Acute kidney injury ([place])   . Dehydration   . Hepatocellular carcinoma ([place])   . C. difficile colitis   . Atrial fibrillation with RVR ([place]) 07/15/2016  . A-fib ([place]) 07/15/2016  . Blunt abdominal trauma 11/18/2013  . Thrombocytopenia ([place]) 11/18/2013  . Atrial fibrillation ([place]) 11/18/2013  . Anticoagulated on Coumadin 11/18/2013  . Hypovolemic shock ([place]) 11/18/2013  . Acute blood loss anemia 11/17/2013  . Pelvic fracture ([place]) 11/15/2013  . Cirrhosis of liver due to hepatitis C 10/05/2012  . Arrhythmia 12/23/2010    Past Medical History: Past Medical History:  Diagnosis Date  . Ascending aorta dilation (HCC)    4 cm by chest CT 2/14  . Atrial flutter ([place])    09/2012 s/p TEE/DCCV  . C. difficile diarrhea 07/2016  . CAD (coronary artery disease)    coronary calcification by chest CT 09/2012  . Dyspnea   . GERD (gastroesophageal reflux disease)   . [place] (hepatocellular carcinoma) ([place])    s/p ablation, with extension to Abdominal/chest wall  . Hepatitis C   . History of tobacco abuse    quit 1972  . Liver cirrhosis ([place])   . Nephrolithiasis   . Shingles   . Skin cancer     Past Surgical History: Past Surgical History:  Procedure Laterality Date  . ABLATION  2012   liver  . CARDIOVERSION N/A 10/04/2012   Procedure: CARDIOVERSION;  Surgeon: Peter M Martinique, MD;  Location: [facility] ENDOSCOPY;  Service: Cardiovascular;  Laterality: N/A;  . LITHOTRIPSY    . SKIN CANCER EXCISION    . TEE WITHOUT CARDIOVERSION N/A 10/04/2012   Procedure: TRANSESOPHAGEAL ECHOCARDIOGRAM (TEE);  Surgeon: Peter M Martinique, MD;  Location: [facility] ENDOSCOPY;  Service: Cardiovascular;  Laterality: N/A;    Social History: Social History  Substance Use Topics  . Smoking status: Former Smoker    Types: Cigarettes    Quit date: 08/15/1970  . Smokeless tobacco: Never Used  . Alcohol use 1.8 oz/week    3 Cans of beer per week     Comment: occasionally   Additional social history: lives at  home alone, 2 daughters live nearby Please also refer to relevant sections of EMR.  Family History: Family History  Problem Relation Age of Onset  . Leukemia Father     Allergies and Medications: Allergies  Allergen Reactions  . 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    Current Outpatient Prescriptions on File Prior to Encounter  Medication Sig Dispense Refill  . acetaminophen (TYLENOL) 325 MG tablet Take 650 mg by mouth every 6 (six) hours as needed (pain).     Marland Kitchen apixaban (ELIQUIS) 5 MG TABS tablet Take 1 tablet (5 mg total) by mouth 2 (two) times daily. 60 tablet 0  . cholestyramine (QUESTRAN) 4 g packet Take 4 g by mouth daily as needed (itching).     Marland Kitchen diltiazem (CARDIZEM CD) 360 MG 24 hr capsule Take 1 capsule (360 mg total) by mouth daily. 90 capsule 1    Objective: BP 122/70   Pulse 75   Temp 97.5 F (36.4 C) (Oral)  Resp 18   Ht 5' 7.5" (1.715 m)   Wt 175 lb 8 oz (79.6 kg)   SpO2 93%   BMI 27.08 kg/m  Exam: General: elderly man laying in bed in NAD Eyes: EOMI. No scleral icterus. ENTM: dry mucous membranes Neck: supple, non-tender Cardiovascular: RRR no MRG Respiratory: CTA bilaterally. No increased work of breathing Gastrointestinal: distended. Tender diffusely to palpation. +BS. No rebound or guarding.  MSK: extremities warm and well perfused, no edema or cyanosis Derm: erythematous maculopapular rash noted on left lower back Neuro: no focal deficits Psych: appropriate mood and affect  Labs and Imaging: CBC BMET   Recent Labs Lab 08/01/16 1654  WBC 4.9  HGB 9.7*  HCT 29.8*  PLT 92*    Recent Labs Lab 08/01/16 1654  NA 142  K 3.8  CL 110  CO2 25  BUN 28*  CREATININE 1.12  GLUCOSE 114*  CALCIUM 9.2     Dg Chest 2 View  Result Date: 08/01/2016 CLINICAL DATA:  Initial evaluation for acute  shortness of breath for 3 days, worsened tonight. EXAM: CHEST  2 VIEW COMPARISON:  Prior radiograph from 07/19/2016. FINDINGS: Mild cardiomegaly, stable. Mediastinal silhouette within normal limits. Tortuosity the intrathoracic aorta noted. Mildly hypoinflated. No focal infiltrates to suggest pneumonia identified. No pulmonary edema or pleural effusion. No pneumothorax. Bones are diffusely osteopenic.  No acute osseous abnormality. IMPRESSION: No active cardiopulmonary disease identified. Electronically Signed   By: Jeannine Boga M.D.   On: 08/01/2016 22:02   Steve Rattler, DO 08/01/2016, 11:51 PM PGY-1, [place] Intern pager: [phone], text pages welcome  UPPER LEVEL ADDENDUM  I have read the above note and made revisions highlighted in blue.  Algis Greenhouse. Jerline Pain, [place] Resident PGY-3 08/02/2016 8:04 AM

## 2016-08-01 NOTE — ED Provider Notes (Addendum)
[facility] DEPT Provider Note   CSN: [id number] Arrival date & time: 08/01/16  1604     History   Chief Complaint Chief Complaint  Patient presents with  . Diarrhea    HPI Allen Allen is a 82 y.o. male.  HPI   Diarrhea coming back yesterday 5 times today Stopped the vancomycin Saturday AM Still taking eliquis and Allen Allen Stomach increased in size, yesterday had difficulty fastening Abdominal pain PT has been working with him Fatigued Pain all the way around the belt line. So sore couldn't even stand up. Moderate pain. No fevers, no n/v.   Saw surgeon on Friday for laminectomy, and initially was doing well, then had CDiff/Allen RVR  Past Medical History:  Diagnosis Date  . Ascending aorta dilation (HCC)    4 cm by chest CT 2/14  . Atrial flutter ([place])    09/2012 s/p TEE/DCCV  . C. difficile diarrhea 07/2016  . CAD (coronary artery disease)    coronary calcification by chest CT 09/2012  . Dyspnea   . GERD (gastroesophageal reflux disease)   . [place] (hepatocellular carcinoma) ([place])    s/p ablation, with extension to Abdominal/chest wall  . Hepatitis C   . History of tobacco abuse    quit 1972  . Liver cirrhosis ([place])   . Nephrolithiasis   . Shingles   . Skin cancer     Patient Active Problem List   Diagnosis Date Noted  . Abdominal pain 08/02/2016  . Ascites 08/01/2016  . Elevated troponin   . Dyspnea   . Mitral valve insufficiency   . Acute kidney injury ([place])   . Dehydration   . Hepatocellular carcinoma ([place])   . C. difficile colitis   . Atrial fibrillation with RVR ([place]) 07/15/2016  . A-fib ([place]) 07/15/2016  . Blunt abdominal trauma 11/18/2013  . Thrombocytopenia ([place]) 11/18/2013  . Atrial fibrillation ([place]) 11/18/2013  . Anticoagulated on Coumadin 11/18/2013  . Hypovolemic shock ([place]) 11/18/2013  . Acute blood loss anemia 11/17/2013  . Pelvic fracture ([place]) 11/15/2013  . Cirrhosis of liver due to hepatitis C 10/05/2012  .  Arrhythmia 12/23/2010    Past Surgical History:  Procedure Laterality Date  . ABLATION  2012   liver  . CARDIOVERSION N/A 10/04/2012   Procedure: CARDIOVERSION;  Surgeon: Peter M Martinique, MD;  Location: [facility] ENDOSCOPY;  Service: Cardiovascular;  Laterality: N/A;  . LITHOTRIPSY    . SKIN CANCER EXCISION    . TEE WITHOUT CARDIOVERSION N/A 10/04/2012   Procedure: TRANSESOPHAGEAL ECHOCARDIOGRAM (TEE);  Surgeon: Peter M Martinique, MD;  Location[facility] ENDOSCOPY;  Service: Cardiovascular;  Laterality: N/A;       Home Medications    Prior to Admission medications   Medication Sig Start Date End Date Taking? Authorizing Provider  acetaminophen (TYLENOL) 325 MG tablet Take 650 mg by mouth every 6 (six) hours as needed (pain).    Yes Historical Provider, MD  apixaban (ELIQUIS) 5 MG TABS tablet Take 1 tablet (5 mg total) by mouth 2 (two) times daily. 07/23/16  Yes Alyssa A Lincoln Brigham, MD  cholestyramine (QUESTRAN) 4 g packet Take 4 g by mouth daily as needed (itching).  07/13/16  Yes Historical Provider, MD  diltiazem ([facility] CD) 360 MG 24 hr capsule Take 1 capsule (360 mg total) by mouth daily. 07/24/16  Yes Alyssa A Lincoln Brigham, MD  ibuprofen (ADVIL,MOTRIN) 200 MG tablet Take 200-400 mg by mouth every 6 (six) hours as needed (pain).   Yes Historical Provider, MD  Menthol,  Topical Analgesic, (MENTHOL EX) Apply 1 application topically 4 (four) times daily as needed (itching).   Yes Historical Provider, MD    Family History Family History  Problem Relation Age of Onset  . Leukemia Father     Social History Social History  Substance Use Topics  . Smoking status: Former Smoker    Types: Cigarettes    Quit date: 08/15/1970  . Smokeless tobacco: Never Used  . Alcohol use 1.8 oz/week    3 Cans of beer per week     Comment: occasionally     Allergies   Penicillins   Review of Systems Review of Systems  Constitutional: Positive for fatigue. Negative for fever.  HENT: Negative for sore throat.   Eyes:  Negative for visual disturbance.  Respiratory: Positive for shortness of breath (some mild dyspnea with exertion). Negative for cough.   Cardiovascular: Negative for chest pain and leg swelling.  Gastrointestinal: Positive for abdominal distention, abdominal pain and diarrhea. Negative for nausea and rectal pain.  Genitourinary: Negative for difficulty urinating.  Musculoskeletal: Negative for back pain and neck stiffness.  Skin: Negative for rash.  Neurological: Negative for syncope and headaches.     Physical Exam Updated Vital Signs BP 124/65 (BP Location: Right Arm)   Pulse 85   Temp 97.5 F (36.4 C) (Oral)   Resp 18   Ht '5\' 8"'$  (1.727 m)   Wt 181 lb 7 oz (82.3 kg)   SpO2 97%   BMI 27.59 kg/m   Physical Exam  Constitutional: He is oriented to person, place, and time. He appears well-developed and well-nourished. No distress.  HENT:  Head: Normocephalic and atraumatic.  Eyes: Conjunctivae and EOM are normal.  Neck: Normal range of motion.  Cardiovascular: Normal rate, regular rhythm, normal heart sounds and intact distal pulses.  Exam reveals no gallop and no friction rub.   No murmur heard. Pulmonary/Chest: Effort normal and breath sounds normal. No respiratory distress. He has no wheezes. He has no rales.  Abdominal: Soft. He exhibits distension. There is tenderness (nonfocal exam, mild diffuse tenderness). There is no guarding.  Musculoskeletal: He exhibits no edema.  Neurological: He is alert and oriented to person, place, and time.  Skin: Skin is warm and dry. He is not diaphoretic.  Nursing note and vitals reviewed.    ED Treatments / Results  Labs (all labs ordered are listed, but only abnormal results are displayed) Labs Reviewed  COMPREHENSIVE METABOLIC PANEL - Abnormal; Notable for the following:       Result Value   Glucose, Bld 114 (*)    BUN 28 (*)    Albumin 2.6 (*)    AST 71 (*)    Total Bilirubin 2.0 (*)    GFR calc non Af Amer 56 (*)    All  other components within normal limits  CBC - Abnormal; Notable for the following:    RBC 3.11 (*)    Hemoglobin 9.7 (*)    HCT 29.8 (*)    RDW 16.1 (*)    Platelets 92 (*)    All other components within normal limits  I-STAT CG4 LACTIC ACID, ED - Abnormal; Notable for the following:    Lactic Acid, Venous 1.91 (*)    All other components within normal limits  LIPASE, BLOOD  BRAIN NATRIURETIC PEPTIDE  BASIC METABOLIC PANEL  CBC  I-STAT CG4 LACTIC ACID, ED    EKG  EKG Interpretation None       Radiology Dg Chest 2 View  Result Date: 08/01/2016 CLINICAL DATA:  Initial evaluation for acute shortness of breath for 3 days, worsened tonight. EXAM: CHEST  2 VIEW COMPARISON:  Prior radiograph from 07/19/2016. FINDINGS: Mild cardiomegaly, stable. Mediastinal silhouette within normal limits. Tortuosity the intrathoracic aorta noted. Mildly hypoinflated. No focal infiltrates to suggest pneumonia identified. No pulmonary edema or pleural effusion. No pneumothorax. Bones are diffusely osteopenic.  No acute osseous abnormality. IMPRESSION: No active cardiopulmonary disease identified. Electronically Signed   By: Jeannine Boga M.D.   On: 08/01/2016 22:02    Procedures Procedures (including critical care time)  Medications Ordered in ED Medications  apixaban (ELIQUIS) tablet 5 mg (5 mg Oral Given 08/02/16 0154)  diltiazem ([facility] CD) 24 hr capsule 360 mg (not administered)  lactobacillus acidophilus (BACID) tablet 2 tablet (not administered)  0.9 %  sodium chloride infusion ( Intravenous New Bag/Given 08/02/16 0105)  acetaminophen (TYLENOL) tablet 650 mg (not administered)    Or  acetaminophen (TYLENOL) suppository 650 mg (not administered)  vancomycin (VANCOCIN) 50 mg/mL oral solution 125 mg (125 mg Oral Given 08/02/16 0154)  sodium chloride 0.9 % bolus 1,000 mL (0 mLs Intravenous Stopped 08/01/16 2354)    BEDSIDE ABDOMINAL US Indication: abdominal distention, evaluate for  presence of ascites Performed by: Dr. Gareth Morgan Findings: mild to moderate ascites present in all quadrants, free fluid seen on 4 views of the abdomen Images are not archived.   Initial Impression / Assessment and Plan / ED Course  I have reviewed the triage vital signs and the nursing notes.  Pertinent labs & imaging results that were available during my care of the patient were reviewed by me and considered in my medical decision making (see chart for details).  Clinical Course    82 year old male with a history of hepatitis C, cirrhosis, prior tobacco use, coronary artery disease, hepatocellular carcinoma, atrial flutter, dilation of the ascending aorta, recent admission for concern of C. difficile colitis and A. fib with RVR, presents with concern for diarrhea beginning again yesterday and today as well as increasing abdominal distention and pain.  Patient's abdominal exam is overall benign and nonfocal and have low suspicion for acute obstruction, appendicitis, diverticulitis, cholecystitis.   Labs show very mild change in his creatinine, other labs at baseline. Lactic acid mildly elevated at 1.9. BNP 39, doubt CHF.   Bedside ultrasound obtained showing ascites. Patient and his daughter reports that while he is had cirrhosis, he's never had ascites as a complication. While his abdominal discomfort may be secondary to distention from ascites, it is difficult to rule out spontaneously bacterial peritonitis.  He is not septic at this time, and given history of recent C. difficile, will not initiate empiric antibiotics for SBP. Feel patient would benefit from continued hydration, monitoring in setting of diarrhea, and IR paracentesis for first time ascites and r/o SBP. Admitted to [facility] in stable condition.   Final Clinical Impressions(s) / ED Diagnoses   Final diagnoses:  Other ascites  Abdominal discomfort  Diarrhea of presumed infectious origin  Fatigue, unspecified type     New Prescriptions Current Discharge Medication List       Gareth Morgan, MD 08/02/16 3716    Gareth Morgan, MD 08/17/16 2219

## 2016-08-02 ENCOUNTER — Inpatient Hospital Stay (HOSPITAL_COMMUNITY): Payer: Medicare Other

## 2016-08-02 DIAGNOSIS — A09 Infectious gastroenteritis and colitis, unspecified: Secondary | ICD-10-CM

## 2016-08-02 DIAGNOSIS — C7972 Secondary malignant neoplasm of left adrenal gland: Secondary | ICD-10-CM | POA: Diagnosis present

## 2016-08-02 DIAGNOSIS — R140 Abdominal distension (gaseous): Secondary | ICD-10-CM | POA: Diagnosis not present

## 2016-08-02 DIAGNOSIS — R1084 Generalized abdominal pain: Secondary | ICD-10-CM | POA: Diagnosis not present

## 2016-08-02 DIAGNOSIS — R64 Cachexia: Secondary | ICD-10-CM | POA: Diagnosis not present

## 2016-08-02 DIAGNOSIS — E46 Unspecified protein-calorie malnutrition: Secondary | ICD-10-CM | POA: Diagnosis present

## 2016-08-02 DIAGNOSIS — C220 Liver cell carcinoma: Secondary | ICD-10-CM

## 2016-08-02 DIAGNOSIS — I4892 Unspecified atrial flutter: Secondary | ICD-10-CM | POA: Diagnosis present

## 2016-08-02 DIAGNOSIS — Z87891 Personal history of nicotine dependence: Secondary | ICD-10-CM | POA: Diagnosis not present

## 2016-08-02 DIAGNOSIS — M533 Sacrococcygeal disorders, not elsewhere classified: Secondary | ICD-10-CM | POA: Diagnosis not present

## 2016-08-02 DIAGNOSIS — C61 Malignant neoplasm of prostate: Secondary | ICD-10-CM | POA: Diagnosis not present

## 2016-08-02 DIAGNOSIS — I340 Nonrheumatic mitral (valve) insufficiency: Secondary | ICD-10-CM | POA: Diagnosis present

## 2016-08-02 DIAGNOSIS — Z8679 Personal history of other diseases of the circulatory system: Secondary | ICD-10-CM | POA: Diagnosis not present

## 2016-08-02 DIAGNOSIS — D649 Anemia, unspecified: Secondary | ICD-10-CM | POA: Diagnosis present

## 2016-08-02 DIAGNOSIS — Z8505 Personal history of malignant neoplasm of liver: Secondary | ICD-10-CM | POA: Diagnosis not present

## 2016-08-02 DIAGNOSIS — R180 Malignant ascites: Secondary | ICD-10-CM | POA: Diagnosis not present

## 2016-08-02 DIAGNOSIS — R530 Neoplastic (malignant) related fatigue: Secondary | ICD-10-CM | POA: Diagnosis not present

## 2016-08-02 DIAGNOSIS — Z87828 Personal history of other (healed) physical injury and trauma: Secondary | ICD-10-CM | POA: Diagnosis not present

## 2016-08-02 DIAGNOSIS — M545 Low back pain: Secondary | ICD-10-CM | POA: Diagnosis not present

## 2016-08-02 DIAGNOSIS — R109 Unspecified abdominal pain: Secondary | ICD-10-CM | POA: Diagnosis not present

## 2016-08-02 DIAGNOSIS — C801 Malignant (primary) neoplasm, unspecified: Secondary | ICD-10-CM | POA: Diagnosis not present

## 2016-08-02 DIAGNOSIS — Z87442 Personal history of urinary calculi: Secondary | ICD-10-CM | POA: Diagnosis not present

## 2016-08-02 DIAGNOSIS — D509 Iron deficiency anemia, unspecified: Secondary | ICD-10-CM | POA: Diagnosis present

## 2016-08-02 DIAGNOSIS — Z8619 Personal history of other infectious and parasitic diseases: Secondary | ICD-10-CM | POA: Diagnosis not present

## 2016-08-02 DIAGNOSIS — M25552 Pain in left hip: Secondary | ICD-10-CM | POA: Diagnosis not present

## 2016-08-02 DIAGNOSIS — B1920 Unspecified viral hepatitis C without hepatic coma: Secondary | ICD-10-CM | POA: Diagnosis present

## 2016-08-02 DIAGNOSIS — A0471 Enterocolitis due to Clostridium difficile, recurrent: Secondary | ICD-10-CM | POA: Diagnosis present

## 2016-08-02 DIAGNOSIS — M25559 Pain in unspecified hip: Secondary | ICD-10-CM | POA: Diagnosis not present

## 2016-08-02 DIAGNOSIS — R197 Diarrhea, unspecified: Secondary | ICD-10-CM

## 2016-08-02 DIAGNOSIS — R188 Other ascites: Secondary | ICD-10-CM | POA: Diagnosis not present

## 2016-08-02 DIAGNOSIS — Z66 Do not resuscitate: Secondary | ICD-10-CM | POA: Diagnosis not present

## 2016-08-02 DIAGNOSIS — K219 Gastro-esophageal reflux disease without esophagitis: Secondary | ICD-10-CM | POA: Diagnosis present

## 2016-08-02 DIAGNOSIS — Z7189 Other specified counseling: Secondary | ICD-10-CM | POA: Diagnosis not present

## 2016-08-02 DIAGNOSIS — D696 Thrombocytopenia, unspecified: Secondary | ICD-10-CM | POA: Diagnosis present

## 2016-08-02 DIAGNOSIS — A0472 Enterocolitis due to Clostridium difficile, not specified as recurrent: Secondary | ICD-10-CM | POA: Diagnosis not present

## 2016-08-02 DIAGNOSIS — Z862 Personal history of diseases of the blood and blood-forming organs and certain disorders involving the immune mechanism: Secondary | ICD-10-CM | POA: Diagnosis not present

## 2016-08-02 DIAGNOSIS — I4891 Unspecified atrial fibrillation: Secondary | ICD-10-CM | POA: Diagnosis present

## 2016-08-02 DIAGNOSIS — M47816 Spondylosis without myelopathy or radiculopathy, lumbar region: Secondary | ICD-10-CM | POA: Diagnosis not present

## 2016-08-02 DIAGNOSIS — K7460 Unspecified cirrhosis of liver: Secondary | ICD-10-CM | POA: Diagnosis present

## 2016-08-02 DIAGNOSIS — Z8719 Personal history of other diseases of the digestive system: Secondary | ICD-10-CM | POA: Diagnosis not present

## 2016-08-02 DIAGNOSIS — K591 Functional diarrhea: Secondary | ICD-10-CM | POA: Diagnosis not present

## 2016-08-02 DIAGNOSIS — R5383 Other fatigue: Secondary | ICD-10-CM | POA: Diagnosis not present

## 2016-08-02 DIAGNOSIS — Z85828 Personal history of other malignant neoplasm of skin: Secondary | ICD-10-CM | POA: Diagnosis not present

## 2016-08-02 DIAGNOSIS — Z7901 Long term (current) use of anticoagulants: Secondary | ICD-10-CM | POA: Diagnosis not present

## 2016-08-02 DIAGNOSIS — B999 Unspecified infectious disease: Secondary | ICD-10-CM | POA: Diagnosis not present

## 2016-08-02 DIAGNOSIS — Z515 Encounter for palliative care: Secondary | ICD-10-CM | POA: Diagnosis not present

## 2016-08-02 DIAGNOSIS — I2510 Atherosclerotic heart disease of native coronary artery without angina pectoris: Secondary | ICD-10-CM | POA: Diagnosis present

## 2016-08-02 DIAGNOSIS — C7951 Secondary malignant neoplasm of bone: Secondary | ICD-10-CM | POA: Diagnosis present

## 2016-08-02 DIAGNOSIS — Z6827 Body mass index (BMI) 27.0-27.9, adult: Secondary | ICD-10-CM | POA: Diagnosis not present

## 2016-08-02 DIAGNOSIS — C229 Malignant neoplasm of liver, not specified as primary or secondary: Secondary | ICD-10-CM | POA: Diagnosis not present

## 2016-08-02 LAB — BRAIN NATRIURETIC PEPTIDE: B NATRIURETIC PEPTIDE 5: 39.3 pg/mL (ref 0.0–100.0)

## 2016-08-02 LAB — BASIC METABOLIC PANEL
ANION GAP: 7 (ref 5–15)
BUN: 27 mg/dL — ABNORMAL HIGH (ref 6–20)
CHLORIDE: 110 mmol/L (ref 101–111)
CO2: 24 mmol/L (ref 22–32)
Calcium: 8.6 mg/dL — ABNORMAL LOW (ref 8.9–10.3)
Creatinine, Ser: 1 mg/dL (ref 0.61–1.24)
GFR calc Af Amer: >60 mL/min (ref >60)
Glucose, Bld: 108 mg/dL — ABNORMAL HIGH (ref 65–99)
POTASSIUM: 3.8 mmol/L (ref 3.5–5.1)
SODIUM: 141 mmol/L (ref 135–145)

## 2016-08-02 LAB — CBC
HEMATOCRIT: 25.9 % — AB (ref 39.0–52.0)
HEMOGLOBIN: 8.6 g/dL — AB (ref 13.0–17.0)
MCH: 31.6 pg (ref 26.0–34.0)
MCHC: 33.2 g/dL (ref 30.0–36.0)
MCV: 95.2 fL (ref 78.0–100.0)
Platelets: 68 10*3/uL — ABNORMAL LOW (ref 150–400)
RBC: 2.72 MIL/uL — AB (ref 4.22–5.81)
RDW: 16 % — ABNORMAL HIGH (ref 11.5–15.5)
WBC: 2.9 10*3/uL — AB (ref 4.0–10.5)

## 2016-08-02 LAB — LACTIC ACID, PLASMA
LACTIC ACID, VENOUS: 1.5 mmol/L (ref 0.5–1.9)
Lactic Acid, Venous: 1.9 mmol/L (ref 0.5–1.9)

## 2016-08-02 MED ORDER — SODIUM CHLORIDE 0.9 % IV SOLN
INTRAVENOUS | Status: DC
  Administered 2016-08-02 – 2016-08-03 (×4): via INTRAVENOUS

## 2016-08-02 MED ORDER — ACETAMINOPHEN 325 MG PO TABS
650.0000 mg | ORAL_TABLET | Freq: Four times a day (QID) | ORAL | Status: DC | PRN
  Administered 2016-08-04 – 2016-08-08 (×7): 650 mg via ORAL
  Filled 2016-08-02 (×8): qty 2

## 2016-08-02 MED ORDER — FIDAXOMICIN 200 MG PO TABS
200.0000 mg | ORAL_TABLET | Freq: Two times a day (BID) | ORAL | Status: AC
Start: 2016-08-02 — End: 2016-08-11
  Administered 2016-08-02 – 2016-08-11 (×20): 200 mg via ORAL
  Filled 2016-08-02 (×21): qty 1

## 2016-08-02 MED ORDER — APIXABAN 5 MG PO TABS
5.0000 mg | ORAL_TABLET | Freq: Two times a day (BID) | ORAL | Status: DC
  Administered 2016-08-02 – 2016-08-10 (×18): 5 mg via ORAL
  Filled 2016-08-02 (×18): qty 1

## 2016-08-02 MED ORDER — VANCOMYCIN 50 MG/ML ORAL SOLUTION
125.0000 mg | Freq: Four times a day (QID) | ORAL | Status: DC
  Administered 2016-08-02 – 2016-08-06 (×18): 125 mg via ORAL
  Filled 2016-08-02 (×20): qty 2.5

## 2016-08-02 MED ORDER — BACID PO TABS
2.0000 | ORAL_TABLET | Freq: Three times a day (TID) | ORAL | Status: DC
  Administered 2016-08-02 – 2016-08-12 (×31): 2 via ORAL
  Filled 2016-08-02 (×32): qty 2

## 2016-08-02 MED ORDER — VANCOMYCIN 50 MG/ML ORAL SOLUTION: 125.0000 mg | Freq: Four times a day (QID) | ORAL | Status: DC

## 2016-08-02 MED ORDER — ACETAMINOPHEN 650 MG RE SUPP: 650.0000 mg | Freq: Four times a day (QID) | RECTAL | Status: DC | PRN

## 2016-08-02 MED ORDER — DILTIAZEM HCL ER COATED BEADS 180 MG PO CP24
360.0000 mg | ORAL_CAPSULE | Freq: Every day | ORAL | Status: DC
  Administered 2016-08-02 – 2016-08-12 (×11): 360 mg via ORAL
  Filled 2016-08-02 (×12): qty 2

## 2016-08-02 NOTE — Progress Notes (Signed)
New Admission Note:  Arrival Method: Stretcher Mental Orientation: Alert and oriented  X 3-4 Telemetry: N/A Assessment: Completed Skin: Warm and dry IV: NSL Pain: Denies  Tubes: N/A Safety Measures: Safety Fall Prevention Plan was given, discussed and signed. Admission: Completed [address] Orientation: Patient has been orientated to the room, unit and the staff. Family: Daughter  Orders have been reviewed and implemented. Will continue to monitor the patient. Call light has been placed within reach and bed alarm has been activated.   Sima Matas BSN, RN  Phone Number: [phone]

## 2016-08-02 NOTE — Progress Notes (Signed)
Called ER RN for report. Room ready.  

## 2016-08-02 NOTE — Progress Notes (Signed)
Family Medicine Teaching Service Daily Progress Note Intern Pager: [phone]  Patient name: EVEN BUDLONG Medical record number: [id number] Date of birth: [birth year] Age: 82 y.o. Gender: male  Primary Care Provider: [name], PA-C Consultants: IR Code Status: full  Pt Overview and Major Events to Date:  12/18- admitted to FPTS  Assessment and Plan:  JERRID FORGETTE is a 82 y.o. male presenting with abdominal pain. PMH is significant for recent C. Dif colitis, a. Fib with RVR, cirrhosis due to hepatitis C, hepatocellular carcinoma  Abdominal Pain- with abdominal discomfort and distention since Sunday 12/17 when he told his daughter his pants wouldn't fit. Is followed at [facility] by oncology every 3 months. Has never had ascites before. Bedside US demonstrates pockets of fluid. Patient is afebrile. WBC 4.9. Lactic acid 1.85, 1.91. Concern for new ascites vs distention and pain due to C. Dif colitis.  -vitals per floor -hold off on SBP prophylaxis for now -IR to do US paracentesis today -fluid studies including cell count, culture -held cholestyramine for now  C. Dif initial recurrence- recently diagnosed with new C. Dif colitis and treated with 14 day course of oral vancomycin. Last dose of vanc Saturday 12/16. Had 5-7 episodes of diarrhea day of admission. With one documented BM since admission. -continue oral vancomycin for initial recurrence of C. Dif -can increase dose of vanc or add flagyl depending on his response -IV fluids to rehydrate, NS at 75cc/hr -contact precautions -KUB today  A fib with RVR- on diltiazem and eliquis at home. Hr 70-90's overnight. -continue home medications -monitor HR  Hepatocellular Carcinoma- Medical oncologist Dr. Shirleen Schirmer at [facility]. Not receiving any chemotherapy currently. 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 CT abdomen/pelvis with New 2 x 3.2 cm LEFT adrenal mass, with thickened RIGHT adrenal gland, in the  setting of cancer this is most compatible with new metastasis -may need to contact oncologist -held home cholestyramine   FEN/GI: regular diet Prophylaxis: on eliquis  Disposition: home with [facility]  Subjective:  Mr. Hallums is feeling well. Stomach feels sore still. Denies other complaints or concerns at this time.   Objective: Temp:  [97.5 F (36.4 C)-97.7 F (36.5 C)] 97.5 F (36.4 C) (12/19 0053) Pulse Rate:  [71-87] 85 (12/19 0053) Resp:  [16-18] 18 (12/19 0053) BP: (97-132)/(53-82) 124/65 (12/19 0053) SpO2:  [93 %-100 %] 97 % (12/19 0053) Weight:  [175 lb 8 oz (79.6 kg)-181 lb 7 oz (82.3 kg)] 181 lb 7 oz (82.3 kg) (12/19 0053) Physical Exam: General: elderly man laying in bed in NAD Cardiovascular: RRR no MRG Respiratory: CTA bilaterally no increased work of breathing Abdomen: distended, tight, tender diffusely.  Extremities: Warm, well perfused, no edema or cyanosis  Laboratory:  Recent Labs Lab 08/01/16 1654  WBC 4.9  HGB 9.7*  HCT 29.8*  PLT 92*    Recent Labs Lab 08/01/16 1654  NA 142  K 3.8  CL 110  CO2 25  BUN 28*  CREATININE 1.12  CALCIUM 9.2  PROT 6.6  BILITOT 2.0*  ALKPHOS 65  ALT 55  AST 71*  GLUCOSE 114*    Imaging/Diagnostic Tests: Dg Chest 2 View  Result Date: 08/01/2016 CLINICAL DATA:  Initial evaluation for acute shortness of breath for 3 days, worsened tonight. EXAM: CHEST  2 VIEW COMPARISON:  Prior radiograph from 07/19/2016. FINDINGS: Mild cardiomegaly, stable. Mediastinal silhouette within normal limits. Tortuosity the intrathoracic aorta noted. Mildly hypoinflated. No focal infiltrates to suggest pneumonia identified. No pulmonary edema or pleural  effusion. No pneumothorax. Bones are diffusely osteopenic.  No acute osseous abnormality. IMPRESSION: No active cardiopulmonary disease identified. Electronically Signed   By: Jeannine Boga M.D.   On: 08/01/2016 22:02     Steve Rattler, DO 08/02/2016, 12:57 AM PGY-1, [place] Intern pager: [phone], text pages welcome

## 2016-08-03 LAB — BASIC METABOLIC PANEL
Anion gap: 3 — ABNORMAL LOW (ref 5–15)
BUN: 26 mg/dL — ABNORMAL HIGH (ref 6–20)
CO2: 23 mmol/L (ref 22–32)
CREATININE: 1.01 mg/dL (ref 0.61–1.24)
Calcium: 8.6 mg/dL — ABNORMAL LOW (ref 8.9–10.3)
Chloride: 115 mmol/L — ABNORMAL HIGH (ref 101–111)
Glucose, Bld: 99 mg/dL (ref 65–99)
Potassium: 3.7 mmol/L (ref 3.5–5.1)
SODIUM: 141 mmol/L (ref 135–145)

## 2016-08-03 LAB — CBC
HCT: 23.9 % — ABNORMAL LOW (ref 39.0–52.0)
HEMOGLOBIN: 7.8 g/dL — AB (ref 13.0–17.0)
MCH: 31.8 pg (ref 26.0–34.0)
MCHC: 32.6 g/dL (ref 30.0–36.0)
MCV: 97.6 fL (ref 78.0–100.0)
PLATELETS: 59 10*3/uL — AB (ref 150–400)
RBC: 2.45 MIL/uL — AB (ref 4.22–5.81)
RDW: 16.3 % — ABNORMAL HIGH (ref 11.5–15.5)
WBC: 2.8 10*3/uL — ABNORMAL LOW (ref 4.0–10.5)

## 2016-08-03 NOTE — Discharge Instructions (Addendum)
You were admitted to the hospital with fluid in your abdomen that was drained. You also had a c diff infection. You completed your antibiotics for c diff.  Hospice will be following along after your discharge from the hospital.   If the fluid in your abdomen re accumulates, please contact your primary care doctor or the hospice team about having a catheter placed.       Information on my medicine - ELIQUIS (apixaban)  This medication education was reviewed with me or my healthcare representative as part of my discharge preparation.  The pharmacist that spoke with me during my hospital stay was:  Einar Grad, [facility]  Why was Eliquis prescribed for you? Eliquis was prescribed for you to reduce the risk of a blood clot forming that can cause a stroke if you have a medical condition called atrial fibrillation (a type of irregular heartbeat).  What do You need to know about Eliquis ? Take your Eliquis TWICE DAILY - one tablet in the morning and one tablet in the evening with or without food. If you have difficulty swallowing the tablet whole please discuss with your pharmacist how to take the medication safely.  Take Eliquis exactly as prescribed by your doctor and DO NOT stop taking Eliquis without talking to the doctor who prescribed the medication.  Stopping may increase your risk of developing a stroke.  Refill your prescription before you run out.  After discharge, you should have regular check-up appointments with your healthcare provider that is prescribing your Eliquis.  In the future your dose may need to be changed if your kidney function or weight changes by a significant amount or as you get older.  What do you do if you miss a dose? If you miss a dose, take it as soon as you remember on the same day and resume taking twice daily.  Do not take more than one dose of ELIQUIS at the same time to make up a missed dose.  Important Safety Information A possible side effect of  Eliquis is bleeding. You should call your healthcare provider right away if you experience any of the following: ? Bleeding from an injury or your nose that does not stop. ? Unusual colored urine (red or dark brown) or unusual colored stools (red or black). ? Unusual bruising for unknown reasons. ? A serious fall or if you hit your head (even if there is no bleeding).  Some medicines may interact with Eliquis and might increase your risk of bleeding or clotting while on Eliquis. To help avoid this, consult your healthcare provider or pharmacist prior to using any new prescription or non-prescription medications, including herbals, vitamins, non-steroidal anti-inflammatory drugs (NSAIDs) and supplements.  This website has more information on Eliquis (apixaban): http://www.eliquis.com/eliquis/home

## 2016-08-03 NOTE — Progress Notes (Addendum)
Family Medicine Teaching Service Attending Note  I interviewed and examined patient Lee Allen and reviewed their tests and x-rays.  I discussed with Dr. Vanetta Shawl and reviewed their note for today.  I agree with their assessment and plan.     Additionally  Feeling pretty well but for frequent bowel movements Minimal pain this am  Continue Cdiff treatment Paracentesis when stable - no signs of SBP Hgb - dropping watch closely

## 2016-08-03 NOTE — Progress Notes (Signed)
Family Medicine Teaching Service Daily Progress Note Intern Pager: [phone]  Patient name: Lee Allen Medical record number: [id number] Date of birth: 1924/09/21 Age: 82 y.o. Gender: male  Primary Care Provider: [name], PA-C Consultants: IR Code Status: full  Pt Overview and Major Events to Date:  12/18- admitted to FPTS  Assessment and Plan:  Lee Allen is a 82 y.o. male presenting with abdominal pain. PMH is significant for recent C. Dif colitis, a. Fib with RVR, cirrhosis due to hepatitis C, hepatocellular carcinoma  Abdominal Pain- with abdominal discomfort and distention since Sunday 12/17 when he told his daughter his pants wouldn't fit. Is followed at [facility] by oncology every 3 months. Has never had ascites before. Bedside US demonstrates pockets of fluid. Patient is afebrile. WBC 4.9. Lactic acid 1.85, 1.91. Concern for new ascites vs distention and pain due to C. Dif colitis.  -vitals per floor -hold off on SBP prophylaxis for now -Abdominal US with moderate ascites -will ask IR to do paracentesis once diarrhea is under control -fluid studies including cell count, culture -held cholestyramine for now in setting of C dif  C. Dif initial recurrence- recently diagnosed with new C. Dif colitis and treated with 14 day course of oral vancomycin. Last dose of vanc Saturday 12/16. Had 5-7 episodes of diarrhea day of admission. KUB negative. With 7 BM on 12/19 -continue oral vancomycin for initial recurrence of C. Dif., can consider increasing dose if diarrhea does not improve -added Dificid per ID recommendations (curbside consult) -IV fluids to rehydrate, NS at 75cc/hr -contact precautions  A fib with RVR- on diltiazem and eliquis at home. Hr 90's overnight. -continue home medications -monitor HR  Hepatocellular Carcinoma- Medical oncologist Dr. Shirleen Schirmer at [facility]. Not receiving any chemotherapy currently. Recent Laminectomy/Decompression in November due to L3  metastasis. CT abdomen/pelvis with New 2 x 3.2 cm LEFT adrenal mass, with thickened RIGHT adrenal gland, in the setting of cancer this is most compatible with new metastasis -may need to contact oncologist -held home cholestyramine due to c. Dif   FEN/GI: regular diet Prophylaxis: on eliquis  Disposition: home with HH  Subjective:  Lee Allen is feeling okay, he has had several episodes of diarrhea overnight. Stomach is still sore. He feels weak. No chest pain or shortness of breath. Looking forward to eating breakfast. Daughter at bedside.  Objective: Temp:  [97.5 F (36.4 C)-98.4 F (36.9 C)] 98.4 F (36.9 C) (12/19 2051) Pulse Rate:  [92-93] 92 (12/19 2051) Resp:  [16-18] 18 (12/19 2051) BP: (119-132)/(63-67) 119/67 (12/19 2051) SpO2:  [94 %-96 %] 94 % (12/19 2051) Weight:  [180 lb 12.4 oz (82 kg)] 180 lb 12.4 oz (82 kg) (12/19 2051) Physical Exam: General: elderly man sitting up in bed in NAD Cardiovascular: RRR no MRG Respiratory: CTA bilaterally no increased work of breathing Abdomen: distended, tight, tender diffusely. Faint BS Extremities: Warm, well perfused, no edema or cyanosis  Laboratory:  Recent Labs Lab 08/01/16 1654 08/02/16 0502  WBC 4.9 2.9*  HGB 9.7* 8.6*  HCT 29.8* 25.9*  PLT 92* 68*    Recent Labs Lab 08/01/16 1654 08/02/16 0502  NA 142 141  K 3.8 3.8  CL 110 110  CO2 25 24  BUN 28* 27*  CREATININE 1.12 1.00  CALCIUM 9.2 8.6*  PROT 6.6  --   BILITOT 2.0*  --   ALKPHOS 65  --   ALT 55  --   AST 71*  --   GLUCOSE 114* 108*  Imaging/Diagnostic Tests: Dg Abd 1 View  Result Date: 08/02/2016 CLINICAL DATA:  Abdominal distention. EXAM: ABDOMEN - 1 VIEW COMPARISON:  CT 07/15/2016. FINDINGS: Soft tissue structures are unremarkable. No bowel distention. No free air. Degenerative changes lumbar spine with scoliosis concave left. Surgical coils noted over the right groin. IMPRESSION: No acute abnormality identified. Electronically Signed    By: Marcello Moores  Register   On: 08/02/2016 14:44   US Abdomen Complete  Result Date: 08/02/2016 CLINICAL DATA:  Abdominal pain and distention, hepatic cirrhosis. EXAM: ABDOMEN ULTRASOUND COMPLETE COMPARISON:  CT scan of July 15, 2016. FINDINGS: Gallbladder: No gallstones visualized. No sonographic Murphy sign noted by sonographer. Mild gallbladder wall thickening is noted at 4 mm due to surrounding ascites. Common bile duct: Diameter: 4.1 mm which is within normal limits. Liver: Heterogeneous echotexture of hepatic parenchyma is noted with nodular hepatic contours consistent with hepatic cirrhosis. 3.5 x 3.1 x 2.4 cm solid mass is noted in right hepatic lobe which corresponds to neoplasm to have been previously treated with ablation. IVC: No abnormality visualized. Pancreas: Not well visualized. Spleen: Maximum measured diameter 12.6 cm with calculated volume of 621 cubic cm consistent with mild splenomegaly. Right Kidney: Length: 12.8 cm. Multiple simple cysts are noted with the largest measuring 2.6 cm. Increased echogenicity of renal parenchyma is noted consistent with medical renal disease. No mass or hydronephrosis visualized. Left Kidney: Length: 12.4 cm. Multiple cysts are noted with the largest measuring 4.7 cm. Increased echogenicity of renal parenchyma is noted consistent with medical renal disease. No mass or hydronephrosis visualized. Abdominal aorta: 3.4 cm abdominal aortic aneurysm. Other findings: Moderate ascites is noted. IMPRESSION: Hepatic cirrhosis with mild splenomegaly and associated moderate ascites. 3.5 cm solid mass is seen in right hepatic lobe which corresponds to neoplasm which has undergone previous ablation. 3.4 cm abdominal aortic aneurysm. Increased echogenicity of renal parenchyma is noted bilaterally suggesting medical renal disease. Electronically Signed   By: Marijo Conception, M.D.   On: 08/02/2016 16:31     Steve Rattler, DO 08/03/2016, 4:35 AM PGY-1, [place] Intern pager: [phone], text pages welcome

## 2016-08-03 NOTE — Discharge Summary (Signed)
[facility] Discharge Summary  Patient name: Lee Allen Medical record number: [id number] Date of birth: 1925/02/08 Age: 82 y.o. Gender: male Date of Admission: 08/01/2016  Date of Discharge: 08/12/16  Admitting Physician: Alveda Reasons, MD  Primary Care Provider: [name], PA-C Consultants: ID, IR, palliative care  Indication for Hospitalization: diarrhea  Discharge Diagnoses/Problem List:  C. Dif colitis initial recurrence Cirrhosis Ascites Hepatocellular carcinoma  Disposition: home with home health 24 hour supervision  Discharge Condition: stable  Discharge Exam: see progress note from day of discharge  Brief Hospital Course:   Lee Allen is 82 year old with PMH of cirrhosis, hepatocellular carcinoma, atrial fibrillation, and recent C. Dif infection who presented to [name] ED on 08/01/16 with abdominal distention and diarrhea. His hospital course by problem is outlined below:  C Diff He was started on oral vancomycin for initial recurrence of C. Dif colitis. ID recommended adding Dificid to his regimen. He completed a 10 day course of oral antibiotics and had resolution of his symptoms at the time of discharge.   Hepatocellular Carcinoma / Ascites His abdomen was tight and distended on admission. Abdominal US revealed moderate ascites. IR performed an US guided paracentesis. The fluid was straw colored with glucose 157, LD 28, total protein < 3, 196 WBC. Palliative care was consulted due to what seemed to be a decline in his health. Palliative recommended home hospice care, which patient and his family were agreeable to. Patient's code status was changed to DNR. Palliative also recommended a pleurx cath for reccumulation of ascites. IR was consulted, however drain was unable to be placed due to there not being enough ascites. He was discharged home with hospice care with instruction to have the catheter placed if the ascites  reacumulated.    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 Patient was started on a regimen here including calcitonin, decadron, oxycodone, and topical lidocaine patch by the palliative care team.  Issues for Follow Up:  1. Will need PleurX catheter placed if ascites re-accumulates. 2. Continue pain regimen at home with oxycodone, scheduled tylenol, decadron, calcitonin, and lidocaine patches 3. Continue decadron and titrate at home as needed 4. Monitor for recurrence of diarrhea, may need more oral vancomycin for C. Dif 5. Consider stopping eliquis as outpatient  Significant Procedures: US guided paracentesis on 12/22.   Significant Labs and Imaging:   Recent Labs Lab 08/10/16 0629 08/11/16 0524 08/12/16 0741  WBC 4.8 3.5* 10.8*  HGB 8.5* 8.5* 8.9*  HCT 26.2* 25.9* 27.7*  PLT 80* 84* 116*    Recent Labs Lab 08/06/16 0405 08/09/16 1312 08/10/16 0629  NA 139 138 138  K 3.8 3.9 4.0  CL 111 107 108  CO2 24 23 20*  GLUCOSE 121* 151* 121*  BUN 20 23* 23*  CREATININE 0.92 1.15 1.18  CALCIUM 8.5* 8.8* 8.8*     Results/Tests Pending at Time of Discharge: none  Discharge Medications:  Allergies as of 08/12/2016      Reactions   Penicillins Dermatitis, Other (See Comments)   Skin peeled from inside of mouth ([place] while in [employer])      Medication List    TAKE these medications   acetaminophen 325 MG tablet Commonly known as:  TYLENOL Take 650 mg by mouth every 6 (six) hours as needed (pain).   apixaban 5 MG Tabs tablet Commonly known as:  ELIQUIS  Take 1 tablet (5 mg total) by mouth 2 (two) times daily.   calcitonin (salmon) 200 UNIT/ACT nasal spray Commonly known as:  MIACALCIN/FORTICAL Place 1 spray into alternate nostrils daily.   cholestyramine 4 g packet Commonly known as:  QUESTRAN Take 4 g by mouth daily as needed (itching).   dexamethasone 4 MG tablet Commonly known as:   DECADRON Take 1 tablet (4 mg total) by mouth 3 (three) times daily.   diltiazem 360 MG 24 hr capsule Commonly known as:  CARDIZEM CD Take 1 capsule (360 mg total) by mouth daily.   ibuprofen 200 MG tablet Commonly known as:  ADVIL,MOTRIN Take 200-400 mg by mouth every 6 (six) hours as needed (pain).   lactobacillus acidophilus Tabs tablet Take 2 tablets by mouth 3 (three) times daily.   lidocaine 5 % Commonly known as:  LIDODERM Place 1 patch onto the skin daily. Remove & Discard patch within 12 hours or as directed by MD   MENTHOL EX Apply 1 application topically 4 (four) times daily as needed (itching).   metoCLOPramide 5 MG tablet Commonly known as:  REGLAN Take 1 tablet (5 mg total) by mouth every 8 (eight) hours as needed (hiccups).   morphine 2 MG/ML injection Inject 1 mL (2 mg total) into the vein every 3 (three) hours as needed (Unreleaved by oral PRN medication).   oxyCODONE 5 MG immediate release tablet Commonly known as:  Oxy IR/ROXICODONE Take 1 tablet (5 mg total) by mouth every 6 (six) hours.   pantoprazole 40 MG tablet Commonly known as:  PROTONIX Take 1 tablet (40 mg total) by mouth daily.   polyethylene glycol packet Commonly known as:  MIRALAX / GLYCOLAX Take 17 g by mouth daily.   senna 8.6 MG Tabs tablet Commonly known as:  SENOKOT Take 1 tablet (8.6 mg total) by mouth daily.       Discharge Instructions: Please refer to Patient Instructions section of EMR for full details.  Patient was counseled important signs and symptoms that should prompt return to medical care, changes in medications, dietary instructions, activity restrictions, and follow up appointments.   Follow-Up Appointments: Follow-up Information    [name], PA-C. Schedule an appointment as soon as possible for a visit.   Specialty:  Physician Assistant Contact information: [place] [address] [phone]        [facility] Follow up.    Specialty:  Hospice and Palliative Medicine Contact information: [place] [address] [phone]           Prepared by: Steve Rattler, DO 08/12/2016, 11:37 AM PGY-1, [place] [facility]  Signed, Algis Greenhouse. Jerline Pain, [place] Medicine Resident PGY-3 08/12/2016 2:23 PM

## 2016-08-04 DIAGNOSIS — R1084 Generalized abdominal pain: Secondary | ICD-10-CM

## 2016-08-04 LAB — HEPATIC FUNCTION PANEL
ALBUMIN: 2.2 g/dL — AB (ref 3.5–5.0)
ALK PHOS: 54 U/L (ref 38–126)
ALT: 45 U/L (ref 17–63)
AST: 65 U/L — AB (ref 15–41)
BILIRUBIN DIRECT: 0.5 mg/dL (ref 0.1–0.5)
BILIRUBIN TOTAL: 1.4 mg/dL — AB (ref 0.3–1.2)
Indirect Bilirubin: 0.9 mg/dL (ref 0.3–0.9)
Total Protein: 5.3 g/dL — ABNORMAL LOW (ref 6.5–8.1)

## 2016-08-04 LAB — BASIC METABOLIC PANEL
Anion gap: 5 (ref 5–15)
BUN: 20 mg/dL (ref 6–20)
CHLORIDE: 116 mmol/L — AB (ref 101–111)
CO2: 21 mmol/L — AB (ref 22–32)
Calcium: 8.3 mg/dL — ABNORMAL LOW (ref 8.9–10.3)
Creatinine, Ser: 0.96 mg/dL (ref 0.61–1.24)
GFR calc Af Amer: >60 mL/min (ref >60)
GFR calc non Af Amer: >60 mL/min (ref >60)
GLUCOSE: 100 mg/dL — AB (ref 65–99)
Potassium: 3.4 mmol/L — ABNORMAL LOW (ref 3.5–5.1)
Sodium: 142 mmol/L (ref 135–145)

## 2016-08-04 LAB — CBC
HEMATOCRIT: 23.6 % — AB (ref 39.0–52.0)
HEMOGLOBIN: 7.6 g/dL — AB (ref 13.0–17.0)
MCH: 30.9 pg (ref 26.0–34.0)
MCHC: 32.2 g/dL (ref 30.0–36.0)
MCV: 95.9 fL (ref 78.0–100.0)
Platelets: 59 10*3/uL — ABNORMAL LOW (ref 150–400)
RBC: 2.46 MIL/uL — ABNORMAL LOW (ref 4.22–5.81)
RDW: 16.4 % — AB (ref 11.5–15.5)
WBC: 2.2 10*3/uL — ABNORMAL LOW (ref 4.0–10.5)

## 2016-08-04 MED ORDER — ENSURE ENLIVE PO LIQD
237.0000 mL | Freq: Two times a day (BID) | ORAL | Status: DC
  Administered 2016-08-04 – 2016-08-10 (×10): 237 mL via ORAL

## 2016-08-04 MED ORDER — MORPHINE SULFATE (PF) 2 MG/ML IV SOLN
1.0000 mg | INTRAVENOUS | Status: DC | PRN
  Administered 2016-08-04 – 2016-08-05 (×3): 1 mg via INTRAVENOUS
  Filled 2016-08-04 (×4): qty 1

## 2016-08-04 MED ORDER — POTASSIUM CHLORIDE CRYS ER 20 MEQ PO TBCR
40.0000 meq | EXTENDED_RELEASE_TABLET | Freq: Once | ORAL | Status: AC
  Administered 2016-08-04: 40 meq via ORAL
  Filled 2016-08-04: qty 2

## 2016-08-04 NOTE — Progress Notes (Signed)
Family Medicine Teaching Service Daily Progress Note Intern Pager: [phone]  Patient name: Lee Allen Medical record number: [id number] Date of birth: [birth year] Age: 82 y.o. Gender: male  Primary Care Provider: [name], PA-C Consultants: IR Code Status: full  Pt Overview and Major Events to Date:  12/18- admitted to FPTS  Assessment and Plan:  Lee Allen is a 82 y.o. male presenting with abdominal pain. PMH is significant for recent C. Dif colitis, a. Fib with RVR, cirrhosis due to hepatitis C, hepatocellular carcinoma  Abdominal Pain- with abdominal discomfort and distention since Sunday 12/17 when he told his daughter his pants wouldn't fit. Is followed at [facility] by oncology every 3 months. Has never had ascites before. Bedside US demonstrates pockets of fluid. Patient is afebrile. WBC 4.9. Lactic acid 1.85, 1.91. Concern for new ascites vs distention and pain due to C. Dif colitis.  -vitals per floor -hold off on SBP prophylaxis for now pending fluid eval -Abdominal US with moderate ascites -will ask IR to do paracentesis once diarrhea is under control, possibly today -fluid studies including cell count, culture -held cholestyramine for now in setting of C dif  C. Dif initial recurrence- recently diagnosed with new C. Dif colitis and treated with 14 day course of oral vancomycin. Last dose of vanc Saturday 12/16. Had 5-7 episodes of diarrhea day of admission. KUB negative. With 7 BM on 12/19. Had 2 BM 12/20.  -continue oral vancomycin for initial recurrence of C. Dif. -continue Dificid per ID recommendations (curbside consult) -contact precautions  A fib with RVR- on diltiazem and eliquis at home. Hr in 80's overnight. -continue home medications -continue to monitor [place] oncologist Dr. Shirleen Schirmer at [facility]. Not receiving any chemotherapy currently. 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 CT  abdomen/pelvis with New 2 x 3.2 cm LEFT adrenal mass, with thickened RIGHT adrenal gland, in the setting of cancer this is most compatible with new metastasis -may need to contact oncologist -held home cholestyramine due to c. Dif   FEN/GI: regular diet Prophylaxis: on eliquis  Disposition: home with HH  Subjective:  Lee Allen is doing well today, he feels his diarrhea is improved. Stomach is still tender. Denies fevers, chills. No chest pain or shortness of breath.   Objective: Temp:  [97.6 F (36.4 C)-98.3 F (36.8 C)] 97.9 F (36.6 C) (12/21 0600) Pulse Rate:  [86-88] 88 (12/21 0600) Resp:  [17-19] 18 (12/21 0600) BP: (105-132)/(55-67) 132/67 (12/21 0600) SpO2:  [94 %-97 %] 95 % (12/21 0600) Weight:  [181 lb 7 oz (82.3 kg)] 181 lb 7 oz (82.3 kg) (12/20 2055) Physical Exam: General: elderly man sitting up in bed eating breakfast, in NAD Cardiovascular: RRR no MRG Respiratory: CTA bilaterally no increased work of breathing Abdomen: distended, tight, tender diffusely. Faint BS Extremities: Warm, well perfused, no edema or cyanosis  Laboratory:  Recent Labs Lab 08/01/16 1654 08/02/16 0502 08/03/16 0550  WBC 4.9 2.9* 2.8*  HGB 9.7* 8.6* 7.8*  HCT 29.8* 25.9* 23.9*  PLT 92* 68* 59*    Recent Labs Lab 08/01/16 1654 08/02/16 0502 08/03/16 0550  NA 142 141 141  K 3.8 3.8 3.7  CL 110 110 115*  CO2 '25 24 23  '$ BUN 28* 27* 26*  CREATININE 1.12 1.00 1.01  CALCIUM 9.2 8.6* 8.6*  PROT 6.6  --   --   BILITOT 2.0*  --   --   ALKPHOS 65  --   --  ALT 55  --   --   AST 71*  --   --   GLUCOSE 114* 108* 99    Imaging/Diagnostic Tests: No results found.   Steve Rattler, DO 08/04/2016, 7:12 AM PGY-1, [place] Intern pager: [phone], text pages welcome

## 2016-08-04 NOTE — Evaluation (Signed)
Physical Therapy Evaluation Patient Details Name: Lee Allen MRN: [id number] DOB: Oct 24, 1924 Today's Date: 08/04/2016   History of Present Illness  Lee Allen is a 82 y.o. male presenting with abdominal pain, distention and malaise. PMH is significant for recent C. Dif colitis, a. Fib with RVR, cirrhosis due to hepatitis C, hepatocellular carcinoma  Clinical Impression  Pt admitted with/for complications above.  Pt needing min/mod assist due to pain and weakness.  Pt currently limited functionally due to the problems listed below.  (see problems list.)  Pt will benefit from PT to maximize function and safety to be able to get home safely with available assist of family .     Follow Up Recommendations Home health PT;Supervision/Assistance - 24 hour    Equipment Recommendations  None recommended by PT    Recommendations for Other Services       Precautions / Restrictions Precautions Precautions: Fall      Mobility  Bed Mobility Overal bed mobility: Needs Assistance Bed Mobility: Supine to Sit;Sit to Supine     Supine to sit: Min assist Sit to supine: Min assist   General bed mobility comments: truncal assist due to abdominal and general pain  Transfers Overall transfer level: Needs assistance Equipment used: 1 person hand held assist Transfers: Sit to/from [facility] Sit to Stand: Min assist;Mod assist (mod lower surface) Stand pivot transfers: Min assist       General transfer comment: more assist due to pain  Ambulation/Gait             General Gait Details: pivotal steps only  Science writer    Modified Rankin (Stroke Patients Only)       Balance Overall balance assessment: Needs assistance Sitting-balance support: Feet supported Sitting balance-Leahy Scale: Fair     Standing balance support: Single extremity supported;Bilateral upper extremity supported Standing balance-Leahy Scale:  Poor Standing balance comment: reliant right now on external support                             Pertinent Vitals/Pain Pain Assessment: Faces Faces Pain Scale: Hurts whole lot Pain Location: stomach/belt line and generally all over Pain Descriptors / Indicators: Grimacing;Guarding;Sore;Tender;Stabbing Pain Intervention(s): Monitored during session;Repositioned;Limited activity within patient's tolerance    Home Living Family/patient expects to be discharged to:: Private residence Living Arrangements: Alone Available Help at Discharge: Family;Available 24 hours/day Type of Home: House Home Access: Level entry;Other (comment) (really 1   1/2 step)     Home Layout: One level Home Equipment: Cane - single point;Walker - 2 wheels;Shower seat;Toilet riser;Grab bars - tub/shower Additional Comments: dtr staying with patient after d/c    Prior Function Level of Independence: Independent               Hand Dominance   Dominant Hand: Right    Extremity/Trunk Assessment   Upper Extremity Assessment Upper Extremity Assessment: Defer to OT evaluation    Lower Extremity Assessment Lower Extremity Assessment: Overall WFL for tasks assessed (mild proximal weakness)       Communication   Communication: No difficulties  Cognition Arousal/Alertness: Awake/alert Behavior During Therapy: WFL for tasks assessed/performed Overall Cognitive Status: Within Functional Limits for tasks assessed                      General Comments      Exercises  Assessment/Plan    PT Assessment Patient needs continued PT services  PT Problem List Decreased strength;Decreased activity tolerance;Decreased balance;Decreased mobility;Decreased knowledge of use of DME;Pain          PT Treatment Interventions DME instruction;Gait training;Therapeutic activities;Therapeutic exercise;Patient/family education;Balance training;Functional mobility training    PT Goals (Current  goals can be found in the Care Plan section)  Acute Rehab PT Goals Patient Stated Goal: to regain independence  PT Goal Formulation: With patient Time For Goal Achievement: 08/11/16 Potential to Achieve Goals: Fair    Frequency Min 3X/week   Barriers to discharge        Co-evaluation               End of Session   Activity Tolerance: Patient limited by pain Patient left: in bed;with call bell/phone within reach;with bed alarm set Nurse Communication: Mobility status         Time: 1520-1550 PT Time Calculation (min) (ACUTE ONLY): 30 min   Charges:   PT Evaluation $PT Eval Moderate Complexity: 1 Procedure PT Treatments $Therapeutic Activity: 8-22 mins   PT G CodesTessie Fass [place] 08/04/2016, 4:08 PM 08/04/2016  Donnella Sham, PT [phone]  (pager)

## 2016-08-04 NOTE — Progress Notes (Signed)
Initial Nutrition Assessment  DOCUMENTATION CODES:   Not applicable  INTERVENTION:  Provide Ensure Enlive po BID, each supplement provides 350 kcal and 20 grams of protein.  Encourage adequate PO intake.   NUTRITION DIAGNOSIS:   Increased nutrient needs related to chronic illness as evidenced by estimated needs.  GOAL:   Patient will meet greater than or equal to 90% of their needs  MONITOR:   PO intake, Supplement acceptance, Labs, Weight trends, Skin, I & O's  REASON FOR ASSESSMENT:    (Ordering nutrtional supplements)    ASSESSMENT:   82 y.o. male presenting with abdominal pain. PMH is significant for recent C. Dif colitis, a. Fib with RVR, cirrhosis due to hepatitis C, hepatocellular carcinoma  Meal completion has been 45-90%. Pt reports eating well at home with usual consumption of at least 3 meals a day. Usual body weight reported to be ~175-180 lbs. Noted pt with ascites, possible plans for paracentesis today. Pt reports his abdomen feeling sore and tight. Pt is agreeable to nutritional supplements to aid in caloric and protein needs. Pt reports he will start drinking Ensure at home for adequate nutrition.   Nutrition-Focused physical exam completed. Findings are moderate fat depletion, mild to moderate muscle depletion, and mild edema. Depletion may be related to the natural aging process as pt eats well at home and pt with no weight loss.   Labs and medications reviewed.   Diet Order:  Diet Heart Room service appropriate? Yes; Fluid consistency: Thin  Skin:  Reviewed, no issues  Last BM:  12/21  Height:   Ht Readings from Last 1 Encounters:  08/02/16 '5\' 8"'$  (1.727 m)    Weight:   Wt Readings from Last 1 Encounters:  08/03/16 181 lb 7 oz (82.3 kg)    Ideal Body Weight:  70 kg  BMI:  Body mass index is 27.59 kg/m.  Estimated Nutritional Needs:   Kcal:  4103-0131  Protein:  90-105 grams  Fluid:  Per MD  EDUCATION NEEDS:   No education needs  identified at this time  Corrin Parker, MS, RD, LDN Pager # [phone] After hours/ weekend pager # [phone]

## 2016-08-04 NOTE — Consult Note (Signed)
   [facility] CM Inpatient Consult   08/04/2016  Lee Allen 01/30/1925 [id number]   Patient screened for potential [place] Management services related to re-admission. Patient is eligible for [facility] Care Management services under patient's Medicare  Plan. Chart review reveals that the patient is an 82 year old male presenting with malaise and abdominal distention. Recent admission for C. Difficile colitis and A. Fib with RVR. He also has known cirrhosis due to hepatitis C and hepatocellular carcinoma. Evidently 2 days prior to admission he mentioned to his daughter that he could no longer button his pants and felt that his abdomen was distended. He also had begun having diarrhea again. He was discharged home recently on vancomycin by mouth but his diarrhea recurred a day or 2 after stopping this.  It has been fairly ongoing and has contributed to further malaise and fatigue at the time of admission.   Met with the patient at the bedside.  Patient states that his daughter took him to [facility] and he was told to come to [facility] for follow up.  He denies any problems with his medications, post hospital follow up or care needs.  He states he intends to return home and has the support of his two daughter.  He did accept the brochure with contact information.   Please place a [facility] Care Management consult if this changes or for questions contact:   Natividad Brood, RN BSN [place] [facility] Liaison  [phone] business mobile phone Toll free office [phone]

## 2016-08-05 ENCOUNTER — Inpatient Hospital Stay (HOSPITAL_COMMUNITY): Payer: Medicare Other

## 2016-08-05 DIAGNOSIS — R530 Neoplastic (malignant) related fatigue: Secondary | ICD-10-CM

## 2016-08-05 DIAGNOSIS — R180 Malignant ascites: Secondary | ICD-10-CM

## 2016-08-05 LAB — BASIC METABOLIC PANEL
ANION GAP: 4 — AB (ref 5–15)
BUN: 22 mg/dL — ABNORMAL HIGH (ref 6–20)
CALCIUM: 8.5 mg/dL — AB (ref 8.9–10.3)
CHLORIDE: 113 mmol/L — AB (ref 101–111)
CO2: 23 mmol/L (ref 22–32)
Creatinine, Ser: 1.05 mg/dL (ref 0.61–1.24)
GFR calc non Af Amer: >60 mL/min (ref >60)
Glucose, Bld: 106 mg/dL — ABNORMAL HIGH (ref 65–99)
POTASSIUM: 3.7 mmol/L (ref 3.5–5.1)
Sodium: 140 mmol/L (ref 135–145)

## 2016-08-05 LAB — CBC
HEMATOCRIT: 22.5 % — AB (ref 39.0–52.0)
HEMOGLOBIN: 7.5 g/dL — AB (ref 13.0–17.0)
MCH: 31.9 pg (ref 26.0–34.0)
MCHC: 33.3 g/dL (ref 30.0–36.0)
MCV: 95.7 fL (ref 78.0–100.0)
Platelets: 61 10*3/uL — ABNORMAL LOW (ref 150–400)
RBC: 2.35 MIL/uL — AB (ref 4.22–5.81)
RDW: 16.5 % — ABNORMAL HIGH (ref 11.5–15.5)
WBC: 2.8 10*3/uL — AB (ref 4.0–10.5)

## 2016-08-05 LAB — BODY FLUID CELL COUNT WITH DIFFERENTIAL
LYMPHS FL: 53 %
Monocyte-Macrophage-Serous Fluid: 46 % — ABNORMAL LOW (ref 50–90)
Neutrophil Count, Fluid: 1 % (ref 0–25)
Total Nucleated Cell Count, Fluid: 196 cu mm (ref 0–1000)

## 2016-08-05 LAB — LACTATE DEHYDROGENASE, PLEURAL OR PERITONEAL FLUID: LD, Fluid: 28 U/L — ABNORMAL HIGH (ref 3–23)

## 2016-08-05 LAB — GRAM STAIN

## 2016-08-05 LAB — PROTEIN, BODY FLUID

## 2016-08-05 LAB — GLUCOSE, SEROUS FLUID: GLUCOSE FL: 157 mg/dL

## 2016-08-05 MED ORDER — LIDOCAINE HCL (PF) 1 % IJ SOLN
INTRAMUSCULAR | Status: AC
  Filled 2016-08-05: qty 10

## 2016-08-05 MED ORDER — PANTOPRAZOLE SODIUM 40 MG PO TBEC
40.0000 mg | DELAYED_RELEASE_TABLET | Freq: Every day | ORAL | Status: DC
  Administered 2016-08-05 – 2016-08-12 (×8): 40 mg via ORAL
  Filled 2016-08-05 (×8): qty 1

## 2016-08-05 NOTE — Care Management Note (Signed)
Case Management Note  Patient Details  Name: Lee Allen MRN: [id number] Date of Birth: 09/09/1924  Subjective/Objective:      CM following for progression and d/c planning.               Action/Plan: 08/05/2016 Noted plan to d/c pt to home with [facility] services. Pt currently active with [facility] for HHPT and HHOT. This Case manager added [facility] for eval and assessment in the home post d/c and [facility] notified of d/c needs.   Expected Discharge Date:  08/05/16               Expected Discharge Plan:  [place]  In-House Referral:  NA  Discharge planning Services  CM Consult  Post Acute Care Choice:  Home Health Choice offered to:  Patient  DME Arranged:  N/A DME Agency:  NA  HH Arranged:  RN, PT, OT HH Agency:  [place]  Status of Service:  Completed, signed off  If discussed at [place] of Stay Meetings, dates discussed:    Additional Comments:  Adron Bene, RN 08/05/2016, 3:57 PM

## 2016-08-05 NOTE — Progress Notes (Signed)
Family Medicine Teaching Service Daily Progress Note Intern Pager: [phone]  Patient name: Lee Allen Medical record number: [id number] Date of birth: 1924/11/03 Age: 82 y.o. Gender: male  Primary Care Provider: [name], PA-C Consultants: IR Code Status: full  Pt Overview and Major Events to Date:  12/18- admitted to [place] 12/22- paracentesis   Assessment and Plan: Lee Allen is a 82 y.o. male presenting with abdominal pain. PMH is significant for recent C. Dif colitis, atrial fibrillation with RVR, cirrhosis due to hepatitis C, hepatocellular carcinoma  Abdominal Pain - resolved, patient tells me he never had "pain" it was more soreness. I suspect his soreness may have been from the ascites. He is s/p paracentesis.  Patient afebrile with a WBC of 3.1. Is followed at [facility] by oncology every 3 months without previous h/o ascites.  -hold off on SBP prophylaxis for now given paracentesis findings.  -held cholestyramine for now in setting of C dif - discontinued morphine, last dose 12/22 at 1000.   C. Diff initial recurrence, improving- recently diagnosed with new C. Dif colitis and treated with 14 day course of oral vancomycin. Had 5-7 episodes of diarrhea day of admission, restarted po vanc on 12/19. No diarrhea overnight. KUB negative.  -continue oral vancomycin for initial recurrence of C. Dif (12/19 > 1/2) -continue Dificid per ID recommendations (curbside consult) > will touch base to determine duration of treatment (12/19 > ?) -contact precautions  A fib with RVR- on diltiazem and eliquis at home. HR in 80's overnight. -continue home medications -continue to monitor HR   Hepatocellular Carcinoma- followed by medical oncologist Dr. Shirleen Schirmer at [facility]. Not receiving any chemotherapy currently. 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 CT abdomen/pelvis with new 2 x 3.2 cm left adrenal mass, with thickened right adrenal gland, in the setting  of cancer this is most compatible with new metastasis -may need to contact oncologist in the outpatient setting -held home cholestyramine due to c. Dif   Anemia baseline Hgb approximately 10, has been slowly downtrending this hospital stay, today Hgb 7.3. Has multiple risk factors for bleed with thrombocytopenia 2/2 HCC and on eliquis for Afib. - recheck FOBT (not yet obtained) - iron studies consistent with iron deficiency anemia, will consider iron supplement now vs once Cdiff has cleared.  - start protonix for GI ppx - monitor CBC  FEN/GI: regular diet Prophylaxis: on eliquis  Disposition: home with HH  Subjective:  Patient doing well overnight. Denies any abdominal pain. States he never had pain, more soreness that has resolved. Notes semi-solid stool yesterday. No blood in stool that he knows of. No hematuria. Eating/drinking well. No fevers/chills.   Objective: Temp:  [97.8 F (36.6 C)-98.3 F (36.8 C)] 97.8 F (36.6 C) (12/23 0500) Pulse Rate:  [75-86] 75 (12/23 0500) Resp:  [16-20] 16 (12/23 0500) BP: (101-128)/(52-86) 101/86 (12/23 0500) SpO2:  [94 %-96 %] 95 % (12/23 0500) Weight:  [185 lb 6.5 oz (84.1 kg)] 185 lb 6.5 oz (84.1 kg) (12/22 2105) Physical Exam: General: elderly man sitting up in bed eating breakfast, in NAD Cardiovascular: irregular rate, normal rhythm, no MRG Respiratory: CTA bilaterally no increased work of breathing Abdomen: +BS, mildly distended, soft, non-tender.  Extremities: Warm, well perfused, no edema or cyanosis  Laboratory:  Recent Labs Lab 08/04/16 0707 08/05/16 0558 08/06/16 0405  WBC 2.2* 2.8* 3.1*  HGB 7.6* 7.5* 7.3*  HCT 23.6* 22.5* 22.0*  PLT 59* 61* 60*    Recent Labs Lab  08/01/16 1654  08/04/16 0707 08/04/16 1049 08/05/16 0558 08/06/16 0405  NA 142  < > 142  --  140 139  K 3.8  < > 3.4*  --  3.7 3.8  CL 110  < > 116*  --  113* 111  CO2 25  < > 21*  --  23 24  BUN 28*  < > 20  --  22* 20  CREATININE 1.12  < > 0.96   --  1.05 0.92  CALCIUM 9.2  < > 8.3*  --  8.5* 8.5*  PROT 6.6  --   --  5.3*  --   --   BILITOT 2.0*  --   --  1.4*  --   --   ALKPHOS 65  --   --  54  --   --   ALT 55  --   --  45  --   --   AST 71*  --   --  65*  --   --   GLUCOSE 114*  < > 100*  --  106* 121*  < > = values in this interval not displayed.  Paracentesis: straw colored, glucose 157, LD 28, total protein < 3,  196 WBC Gram stain WBCs, predominantly mononuclear cells.   Imaging/Diagnostic Tests: US Paracentesis  Result Date: 08/05/2016 INDICATION: Cirrhosis, hepatitis-C, hepatocellular carcinoma, c diff infection, ascites; request made for diagnostic and therapeutic paracentesis. EXAM: ULTRASOUND GUIDED DIAGNOSTIC AND THERAPEUTIC PARACENTESIS MEDICATIONS: None. COMPLICATIONS: None immediate. PROCEDURE: Informed written consent was obtained from the patient after a discussion of the risks, benefits and alternatives to treatment. A timeout was performed prior to the initiation of the procedure. Initial ultrasound scanning demonstrates a large amount of ascites within the right upper to mid abdominal quadrant. The right upper to mid abdomen was prepped and draped in the usual sterile fashion. 1% lidocaine was used for local anesthesia. Following this, a Yueh catheter was introduced. An ultrasound image was saved for documentation purposes. The paracentesis was performed. The catheter was removed and a dressing was applied. The patient tolerated the procedure well without immediate post procedural complication. FINDINGS: A total of approximately 3.1 liters of clear, light yellow fluid was removed. Samples were sent to the laboratory as requested by the clinical team. IMPRESSION: Successful ultrasound-guided diagnostic and therapeutic paracentesis yielding 3.1 liters of peritoneal fluid. Read by: Rowe Robert, PA-C Electronically Signed   By: Corrie Mckusick D.O.   On: 08/05/2016 16:37     Archie Patten, MD 08/06/2016, 7:03  AM PGY-3, [place] Intern pager: [phone], text pages welcome

## 2016-08-05 NOTE — Procedures (Signed)
Ultrasound-guided diagnostic and therapeutic paracentesis performed yielding 3.1  liters of clear, light yellow  fluid. No immediate complications. A portion of the fluid was sent to the lab for preordered studies.

## 2016-08-05 NOTE — Care Management Important Message (Signed)
Important Message  Patient Details  Name: Lee Allen MRN: [id number] Date of Birth: 1924-11-28   Medicare Important Message Given:  Yes    Vashon Arch, Rory Percy, RN 08/05/2016, 3:48 PM

## 2016-08-05 NOTE — Progress Notes (Signed)
Family Medicine Teaching Service Daily Progress Note Intern Pager: [phone]  Patient name: Lee Allen Medical record number: [id number] Date of birth: [birth year] Age: 82 y.o. Gender: male  Primary Care Provider: [name], PA-C Consultants: IR Code Status: full  Pt Overview and Major Events to Date:  12/18- admitted to [facility]  Assessment and Plan: Lee Allen is a 82 y.o. male presenting with abdominal pain. PMH is significant for recent C. Dif colitis, a. Fib with RVR, cirrhosis due to hepatitis C, hepatocellular carcinoma  Abdominal Pain - d/t C dif colitis vs ascites. Patient with abdominal discomfort and distention since 12/17 but afebrile, lactic acid 1.51 and WBC 2.8. Is followed at [facility] by oncology every 3 months without previous h/o ascites. Bedside US on 12/19 demonstrated pockets of fluid and would likely benefit from paracentesis when diarrhea is under control. -vitals per floor -hold off on SBP prophylaxis for now pending fluid eval -Abdominal US with moderate ascites -paracentesis by IR  -fluid studies including cell count, culture -held cholestyramine for now in setting of C dif  C. Diff initial recurrence, improving- recently diagnosed with new C. Dif colitis and treated with 14 day course of oral vancomycin. Had 5-7 episodes of diarrhea day of admission, restarted po vanc on 12/19, no diarrhea this am. KUB negative.  -continue oral vancomycin for initial recurrence of C. Dif (12/19 > 1/2) -continue Dificid per ID recommendations (curbside consult) -contact precautions  A fib with RVR- on diltiazem and eliquis at home. HR in 80's overnight. -continue home medications -continue to monitor [place] oncologist Dr. Shirleen Schirmer at [facility]. Not receiving any chemotherapy currently. 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 CT abdomen/pelvis with New 2 x 3.2 cm LEFT adrenal mass, with thickened RIGHT adrenal  gland, in the setting of cancer this is most compatible with new metastasis -may need to contact oncologist -held home cholestyramine due to c. Dif   Anemia baseline Hgb approximately 10, has been slowly downtrending this hospital stay, today Hgb . Has multiple risk factors for bleed with thrombocytopenia 2/2 HCC and on eliquis for Afib. - recheck FOBT  - start protonix for GI ppx - monitor CBC  FEN/GI: regular diet Prophylaxis: on eliquis  Disposition: home with HH  Subjective:  Continues to feel abdominal discomfort from distention and pain when he walks/stands. States no longer has diarrhea today. Denies fevers/chills, CP, palpitations, SOB, n/v.   Objective: Temp:  [97.3 F (36.3 C)-98.4 F (36.9 C)] 97.3 F (36.3 C) (12/22 0526) Pulse Rate:  [75-90] 75 (12/22 0526) Resp:  [16-19] 16 (12/22 0526) BP: (102-119)/(53-79) 116/64 (12/22 0526) SpO2:  [95 %-96 %] 95 % (12/22 0526) Weight:  [82.4 kg (181 lb 10.5 oz)] 82.4 kg (181 lb 10.5 oz) (12/21 2102) Physical Exam: General: elderly man sitting up in bed eating breakfast, in NAD Cardiovascular: RRR no MRG Respiratory: CTA bilaterally no increased work of breathing Abdomen: distended, tight, tender diffusely. Faint BS Extremities: Warm, well perfused, no edema or cyanosis  Laboratory:  Recent Labs Lab 08/02/16 0502 08/03/16 0550 08/04/16 0707  WBC 2.9* 2.8* 2.2*  HGB 8.6* 7.8* 7.6*  HCT 25.9* 23.9* 23.6*  PLT 68* 59* 59*    Recent Labs Lab 08/01/16 1654 08/02/16 0502 08/03/16 0550 08/04/16 0707 08/04/16 1049  NA 142 141 141 142  --   K 3.8 3.8 3.7 3.4*  --   CL 110 110 115* 116*  --   CO2 25 24 23  21*  --   BUN 28* 27* 26* 20  --   CREATININE 1.12 1.00 1.01 0.96  --   CALCIUM 9.2 8.6* 8.6* 8.3*  --   PROT 6.6  --   --   --  5.3*  BILITOT 2.0*  --   --   --  1.4*  ALKPHOS 65  --   --   --  54  ALT 55  --   --   --  45  AST 71*  --   --   --  65*  GLUCOSE 114* 108* 99 100*  --     Imaging/Diagnostic  Tests: No results found.   Bufford Lope, DO 08/05/2016, 6:58 AM PGY-1, [place] Intern pager: [phone], text pages welcome

## 2016-08-06 DIAGNOSIS — A0472 Enterocolitis due to Clostridium difficile, not specified as recurrent: Secondary | ICD-10-CM

## 2016-08-06 LAB — CBC
HEMATOCRIT: 22 % — AB (ref 39.0–52.0)
HEMOGLOBIN: 7.3 g/dL — AB (ref 13.0–17.0)
MCH: 31.7 pg (ref 26.0–34.0)
MCHC: 33.2 g/dL (ref 30.0–36.0)
MCV: 95.7 fL (ref 78.0–100.0)
Platelets: 60 10*3/uL — ABNORMAL LOW (ref 150–400)
RBC: 2.3 MIL/uL — AB (ref 4.22–5.81)
RDW: 16.6 % — AB (ref 11.5–15.5)
WBC: 3.1 10*3/uL — AB (ref 4.0–10.5)

## 2016-08-06 LAB — BASIC METABOLIC PANEL
ANION GAP: 4 — AB (ref 5–15)
BUN: 20 mg/dL (ref 6–20)
CALCIUM: 8.5 mg/dL — AB (ref 8.9–10.3)
CHLORIDE: 111 mmol/L (ref 101–111)
CO2: 24 mmol/L (ref 22–32)
Creatinine, Ser: 0.92 mg/dL (ref 0.61–1.24)
GFR calc non Af Amer: >60 mL/min (ref >60)
Glucose, Bld: 121 mg/dL — ABNORMAL HIGH (ref 65–99)
Potassium: 3.8 mmol/L (ref 3.5–5.1)
Sodium: 139 mmol/L (ref 135–145)

## 2016-08-06 LAB — IRON AND TIBC
Iron: 19 ug/dL — ABNORMAL LOW (ref 45–182)
Saturation Ratios: 6 % — ABNORMAL LOW (ref 17.9–39.5)
TIBC: 301 ug/dL (ref 250–450)
UIBC: 282 ug/dL

## 2016-08-06 LAB — FERRITIN: FERRITIN: 21 ng/mL — AB (ref 24–336)

## 2016-08-06 NOTE — Plan of Care (Signed)
Problem: Nutrition: Goal: Adequate nutrition will be maintained Outcome: Progressing Patient ate 75% of breakfast and drank 237 mL vanilla ensure this morning. Will continue to monitor and encourage PO intake.

## 2016-08-06 NOTE — Progress Notes (Signed)
Spoke with Dr. Dietrich P[place] with ID concerning duration of Dificid. He notes that he typically doesn't treat the patient with both vancomycin and dificid. Advised to continue Dificid for a 10 day course total and discontinue vancomycin.  Archie Patten, MD [facility] Family Medicine Resident  08/06/2016, 8:15 AM

## 2016-08-07 ENCOUNTER — Inpatient Hospital Stay (HOSPITAL_COMMUNITY): Payer: Medicare Other

## 2016-08-07 DIAGNOSIS — R64 Cachexia: Secondary | ICD-10-CM

## 2016-08-07 LAB — CBC
HCT: 22.7 % — ABNORMAL LOW (ref 39.0–52.0)
Hemoglobin: 7.4 g/dL — ABNORMAL LOW (ref 13.0–17.0)
MCH: 31.9 pg (ref 26.0–34.0)
MCHC: 32.6 g/dL (ref 30.0–36.0)
MCV: 97.8 fL (ref 78.0–100.0)
PLATELETS: 54 10*3/uL — AB (ref 150–400)
RBC: 2.32 MIL/uL — AB (ref 4.22–5.81)
RDW: 17 % — ABNORMAL HIGH (ref 11.5–15.5)
WBC: 2.8 10*3/uL — AB (ref 4.0–10.5)

## 2016-08-07 MED ORDER — SODIUM CHLORIDE 0.9 % IV SOLN
510.0000 mg | Freq: Once | INTRAVENOUS | Status: AC
  Administered 2016-08-07: 510 mg via INTRAVENOUS
  Filled 2016-08-07: qty 17

## 2016-08-07 NOTE — Progress Notes (Signed)
Family Medicine Teaching Service Daily Progress Note Intern Pager: [phone]  Patient name: Lee Allen Medical record number: [id number] Date of birth: 1925-01-06 Age: 82 y.o. Gender: male  Primary Care Provider: [name], PA-C Consultants: IR Code Status: full  Pt Overview and Major Events to Date:  12/18- admitted to [place] 12/22- paracentesis   Assessment and Plan: Lee Allen is a 82 y.o. male presenting with abdominal pain. PMH is significant for recent C. Dif colitis, atrial fibrillation with RVR, cirrhosis due to hepatitis C, hepatocellular carcinoma  Abdominal Pain - Resolved, still with tightness in his belly this morning, however improved with 3L obtained via paracentesis .  - Will continue to follow with daily exams   Left Hip/Back pain. 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 Good strength and sensation in  Limbs today  - Will obtain hip, sacral/lumber xrays, follow carefully for changes in function  - PT/OT  C. Diff initial recurrence, improving- recently diagnosed with new C. Dif colitis and treated with 14 day course of oral vancomycin. Had 5-7 episodes of diarrhea day of admission, restarted po vanc on 12/19. No diarrhea overnight. KUB negative.  -continue oral vancomycin for initial recurrence of C. Dif (12/19 > 12/23), discontinued vancomycin per ID recs  - Continue Dificid for a total of 10 days (12/19 > ?), day 6 of 10  -contact precautions - Remains very weak on exam, PT/OT evaluate  - Case management for medication as an outpatient   A fib with RVR- on diltiazem and eliquis at home. HR in 80's overnight. -continue home medications -continue to monitor HR   Hepatocellular Carcinoma- followed by medical oncologist Dr. Shirleen Schirmer at [facility]. Not receiving any chemotherapy currently. 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 CT abdomen/pelvis with new 2 x 3.2 cm left adrenal mass,  with thickened right adrenal gland, in the setting of cancer this is most compatible with new metastasis -may need to contact oncologist in the outpatient setting -held home cholestyramine due to c. Dif   Anemia baseline Hgb approximately 10, has been slowly downtrending this hospital stay. Stable at hgb 7.4 Has multiple risk factors for bleed with thrombocytopenia 2/2 HCC and on eliquis for Afib. - FOBT pending  - IV Feraheme  - Continue protonix for GI ppx - monitor CBC  FEN/GI: regular diet Prophylaxis: on eliquis  Disposition: home with HH  Subjective:  Patient is weak this morning. Pain in  Hip/lower back, pain does not radiate.   Objective: Temp:  [97.5 F (36.4 C)-98.1 F (36.7 C)] 97.5 F (36.4 C) (12/24 0947) Pulse Rate:  [77-86] 77 (12/24 0947) Resp:  [17-18] 17 (12/24 0947) BP: (99-112)/(45-63) 100/45 (12/24 0947) SpO2:  [96 %-98 %] 97 % (12/24 0947) Weight:  [182 lb 8.7 oz (82.8 kg)] 182 lb 8.7 oz (82.8 kg) (12/23 1950) Physical Exam: General: elderly man sitting up in bed eating breakfast, in NAD  Cardiovascular: RRR, no murmurs  Respiratory: CTA bilaterally no increased work of breathing Abdomen: +BS, mildly distended, soft, non-tender.  Extremities: Warm, well perfused, no edema or cyanosis, 5/5 strength and sensation in bilaterally extremities, no changes in sensation.  Back: tenderness to palpation along left lower paraspinal muscles.    Laboratory:  Recent Labs Lab 08/05/16 0558 08/06/16 0405 08/07/16 0449  WBC 2.8* 3.1* 2.8*  HGB 7.5* 7.3* 7.4*  HCT 22.5* 22.0* 22.7*  PLT 61* 60* 54*    Recent Labs Lab 08/01/16 1654  08/04/16 0707 08/04/16 1049 08/05/16 0558 08/06/16 0405  NA 142  < > 142  --  140 139  K 3.8  < > 3.4*  --  3.7 3.8  CL 110  < > 116*  --  113* 111  CO2 25  < > 21*  --  23 24  BUN 28*  < > 20  --  22* 20  CREATININE 1.12  < > 0.96  --  1.05 0.92  CALCIUM 9.2  < > 8.3*  --  8.5* 8.5*  PROT 6.6  --   --  5.3*  --   --    BILITOT 2.0*  --   --  1.4*  --   --   ALKPHOS 65  --   --  54  --   --   ALT 55  --   --  45  --   --   AST 71*  --   --  65*  --   --   GLUCOSE 114*  < > 100*  --  106* 121*  < > = values in this interval not displayed.  Paracentesis: straw colored, glucose 157, LD 28, total protein < 3,  196 WBC Gram stain WBCs, predominantly mononuclear cells.   Imaging/Diagnostic Tests: No results found.   Kadie Balestrieri Cletis Media, MD 08/07/2016, 12:21 PM PGY-3, [place] Intern pager: [phone], text pages welcome

## 2016-08-07 NOTE — Progress Notes (Addendum)
PT Cancellation Note  Patient Details Name: Lee Allen MRN: [id number] DOB: [birth year]   Cancelled Treatment:    Reason Eval/Treat Not Completed: Patient declined, no reason specified  Pt declined PT, stating come back another time. Noted imminent Dc order but pt and family state he is not leaving anytime soon.  Recommend OOB with nursing and PT will return on 08/09/16.     [name] 08/07/2016, 1:33 PM  [name], [place] 08/07/2016

## 2016-08-08 LAB — CBC
HCT: 23.2 % — ABNORMAL LOW (ref 39.0–52.0)
Hemoglobin: 7.8 g/dL — ABNORMAL LOW (ref 13.0–17.0)
MCH: 32 pg (ref 26.0–34.0)
MCHC: 33.6 g/dL (ref 30.0–36.0)
MCV: 95.1 fL (ref 78.0–100.0)
Platelets: 66 10*3/uL — ABNORMAL LOW (ref 150–400)
RBC: 2.44 MIL/uL — ABNORMAL LOW (ref 4.22–5.81)
RDW: 16.7 % — AB (ref 11.5–15.5)
WBC: 3.3 10*3/uL — AB (ref 4.0–10.5)

## 2016-08-08 LAB — HEMOGLOBIN AND HEMATOCRIT, BLOOD
HCT: 26.4 % — ABNORMAL LOW (ref 39.0–52.0)
Hemoglobin: 8.7 g/dL — ABNORMAL LOW (ref 13.0–17.0)

## 2016-08-08 LAB — PREPARE RBC (CROSSMATCH)

## 2016-08-08 MED ORDER — SODIUM CHLORIDE 0.9 % IV SOLN
Freq: Once | INTRAVENOUS | Status: AC
  Administered 2016-08-08: 12:00:00 via INTRAVENOUS

## 2016-08-08 MED ORDER — TRAMADOL HCL 50 MG PO TABS
50.0000 mg | ORAL_TABLET | Freq: Four times a day (QID) | ORAL | Status: DC | PRN
  Administered 2016-08-08 – 2016-08-09 (×2): 50 mg via ORAL
  Filled 2016-08-08 (×2): qty 1

## 2016-08-08 NOTE — Progress Notes (Signed)
Family Medicine Teaching Service Daily Progress Note Intern Pager: [phone]  Patient name: Lee Allen Medical record number: [id number] Date of birth: 07-03-1925 Age: 82 y.o. Gender: male  Primary Care Provider: [name], PA-C Consultants: IR Code Status: full  Pt Overview and Major Events to Date:  12/18- admitted to [place] 12/22- paracentesis   Assessment and Plan: AMIIR HECKARD is a 82 y.o. male presenting with abdominal pain. PMH is significant for recent C. Dif colitis, atrial fibrillation with RVR, cirrhosis due to hepatitis C, hepatocellular carcinoma  Abdominal Pain -  3L obtained via paracentesis off on 12/22 - Will continue to follow with daily exams   Left Hip/Back pain. 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 Xray of hip/lumbar/sacral within normal limit  - If pain becomes severe, consider MRI to check for mets - PT/OT for placement back on 12/26  C. Diff initial recurrence- Improved. Only 1 BM yesterday.  - continue oral vancomycin for initial recurrence of C. Dif (12/19 > 12/23) - Continue Dificid for a total of 10 days (12/19 > ?), day 7 of 10   A fib with RVR- on diltiazem and eliquis at home. -continue home medications -continue to monitor HR   Hepatocellular Carcinoma- followed by medical oncologist Dr. Shirleen Schirmer at [facility]. Not receiving any chemotherapy currently. Metastasis throughout  -held home cholestyramine due to c. Dif  - Consulted palliative to discuss options for patient, patient amenable to this   Anemia baseline Hgb 10. Hgb 7.4> IV Iron > 7.8. Improved strength today per patient. Weakness possibly due to Anemia, will give him a unit of RBCs to see if improvement. - FOBT pending  - Could consider give 1 unit of RBCs  - Continue protonix for GI ppx - monitor CBC  FEN/GI: regular diet Prophylaxis: on eliquis  Disposition: home with HH  Subjective:  Patient is improved this morning,  feels like he has more energy   Objective: Temp:  [97.5 F (36.4 C)-98.1 F (36.7 C)] 98.1 F (36.7 C) (12/24 2153) Pulse Rate:  [77-83] 83 (12/24 2153) Resp:  [16-17] 16 (12/24 2153) BP: (100-113)/(45-61) 112/61 (12/24 2153) SpO2:  [96 %-98 %] 96 % (12/24 2153) Physical Exam: General: elderly man  in NAD, lying in bed Cardiovascular: RRR, no murmurs  Respiratory: CTA bilaterally no increased work of breathing Abdomen: +BS, slightly distended, soft, non-tender.  Extremities: Warm, well perfused, no edema or cyanosis, 5/5 strength and sensation in bilaterally extremities, no changes in sensation.    Laboratory:  Recent Labs Lab 08/06/16 0405 08/07/16 0449 08/08/16 0503  WBC 3.1* 2.8* 3.3*  HGB 7.3* 7.4* 7.8*  HCT 22.0* 22.7* 23.2*  PLT 60* 54* 66*    Recent Labs Lab 08/01/16 1654  08/04/16 0707 08/04/16 1049 08/05/16 0558 08/06/16 0405  NA 142  < > 142  --  140 139  K 3.8  < > 3.4*  --  3.7 3.8  CL 110  < > 116*  --  113* 111  CO2 25  < > 21*  --  23 24  BUN 28*  < > 20  --  22* 20  CREATININE 1.12  < > 0.96  --  1.05 0.92  CALCIUM 9.2  < > 8.3*  --  8.5* 8.5*  PROT 6.6  --   --  5.3*  --   --   BILITOT 2.0*  --   --  1.4*  --   --  ALKPHOS 65  --   --  54  --   --   ALT 55  --   --  45  --   --   AST 71*  --   --  65*  --   --   GLUCOSE 114*  < > 100*  --  106* 121*  < > = values in this interval not displayed.  Paracentesis: straw colored, glucose 157, LD 28, total protein < 3,  196 WBC Gram stain WBCs, predominantly mononuclear cells.   Imaging/Diagnostic Tests: Dg Lumbar Spine 2-3 Views  Result Date: 08/07/2016 CLINICAL DATA:  Left hip/back pain, hepatocellular carcinoma with metastases to spinal cord EXAM: LUMBAR SPINE - 2-3 VIEW COMPARISON:  CT abdomen/ pelvis dated 07/15/2016 FINDINGS: Five lumbar-type vertebral bodies. Normal lumbar lordosis. No evidence of fracture or dislocation. Vertebral body heights are maintained. Mild to moderate multilevel  degenerative changes. Visualized bony pelvis appears intact. IMPRESSION: No fracture or dislocation is seen. Mild to moderate multilevel degenerative changes. Electronically Signed   By: Julian Hy M.D.   On: 08/07/2016 15:20   Dg Sacrum/coccyx  Result Date: 08/07/2016 CLINICAL DATA:  Left hip/ back pain, metastatic carcinoma with metastases to spinal cord EXAM: SACRUM AND COCCYX - 2+ VIEW COMPARISON:  None. FINDINGS: There is no evidence of fracture or other focal bone lesions. Degenerative changes of the lumbar spine. Vascular calcifications. Embolization coils overlying the right groin. IMPRESSION: Negative. Electronically Signed   By: Julian Hy M.D.   On: 08/07/2016 15:21   Dg Hip Unilat With Pelvis 2-3 Views Left  Result Date: 08/07/2016 CLINICAL DATA:  Left hip and back pain. History of hepatocellular carcinoma metastasis. EXAM: DG HIP (WITH OR WITHOUT PELVIS) 2-3V LEFT COMPARISON:  CT 07/15/2016 FINDINGS: Lucency involving the L3 vertebral body is compatible with a known lesion. There are small coils in the right inguinal region. Deformity of the right iliac wing is unchanged. No acute pelvic bony abnormality. Mild joint space narrowing in the left hip. There is no evidence for a left hip fracture or dislocation. Atherosclerotic calcifications. IMPRESSION: No acute bone abnormality in the pelvis or left hip. Stable bone changes as described. Electronically Signed   By: Markus Daft M.D.   On: 08/07/2016 15:24     Brendia Dampier Cletis Media, MD 08/08/2016, 7:12 AM PGY-2, [place] Intern pager: [phone], text pages welcome

## 2016-08-09 ENCOUNTER — Inpatient Hospital Stay (HOSPITAL_COMMUNITY): Payer: Medicare Other

## 2016-08-09 DIAGNOSIS — M545 Low back pain, unspecified: Secondary | ICD-10-CM

## 2016-08-09 DIAGNOSIS — R5383 Other fatigue: Secondary | ICD-10-CM

## 2016-08-09 DIAGNOSIS — M25559 Pain in unspecified hip: Secondary | ICD-10-CM

## 2016-08-09 DIAGNOSIS — Z7189 Other specified counseling: Secondary | ICD-10-CM

## 2016-08-09 DIAGNOSIS — M25552 Pain in left hip: Secondary | ICD-10-CM

## 2016-08-09 LAB — BASIC METABOLIC PANEL
ANION GAP: 8 (ref 5–15)
BUN: 23 mg/dL — ABNORMAL HIGH (ref 6–20)
CHLORIDE: 107 mmol/L (ref 101–111)
CO2: 23 mmol/L (ref 22–32)
CREATININE: 1.15 mg/dL (ref 0.61–1.24)
Calcium: 8.8 mg/dL — ABNORMAL LOW (ref 8.9–10.3)
GFR calc non Af Amer: 54 mL/min — ABNORMAL LOW (ref >60)
Glucose, Bld: 151 mg/dL — ABNORMAL HIGH (ref 65–99)
POTASSIUM: 3.9 mmol/L (ref 3.5–5.1)
SODIUM: 138 mmol/L (ref 135–145)

## 2016-08-09 LAB — TYPE AND SCREEN
ABO/RH(D): B POS
ANTIBODY SCREEN: NEGATIVE
Unit division: 0

## 2016-08-09 LAB — CBC
HEMATOCRIT: 26.1 % — AB (ref 39.0–52.0)
HEMOGLOBIN: 8.6 g/dL — AB (ref 13.0–17.0)
MCH: 30.8 pg (ref 26.0–34.0)
MCHC: 33 g/dL (ref 30.0–36.0)
MCV: 93.5 fL (ref 78.0–100.0)
Platelets: 72 10*3/uL — ABNORMAL LOW (ref 150–400)
RBC: 2.79 MIL/uL — ABNORMAL LOW (ref 4.22–5.81)
RDW: 18.1 % — ABNORMAL HIGH (ref 11.5–15.5)
WBC: 3.9 10*3/uL — AB (ref 4.0–10.5)

## 2016-08-09 MED ORDER — OXYCODONE HCL 5 MG PO TABS
5.0000 mg | ORAL_TABLET | Freq: Four times a day (QID) | ORAL | Status: DC
  Administered 2016-08-09 – 2016-08-10 (×4): 5 mg via ORAL
  Filled 2016-08-09 (×4): qty 1

## 2016-08-09 MED ORDER — MORPHINE SULFATE (PF) 2 MG/ML IV SOLN: 2.0000 mg | INTRAVENOUS | Status: DC | PRN

## 2016-08-09 MED ORDER — OXYCODONE HCL 5 MG PO TABS: 5.0000 mg | ORAL_TABLET | ORAL | Status: DC | PRN

## 2016-08-09 NOTE — Progress Notes (Signed)
Family Medicine Teaching Service Daily Progress Note Intern Pager: [phone]  Patient name: Lee Allen Medical record number: [id number] Date of birth: 1925/06/09 Age: 82 y.o. Gender: male  Primary Care Provider: [name], PA-C Consultants: IR Code Status: full  Pt Overview and Major Events to Date:  12/18- admitted to [place] 12/22- paracentesis   Assessment and Plan: SUN WILENSKY is a 82 y.o. male presenting with abdominal pain. PMH is significant for recent C. Dif colitis, atrial fibrillation with RVR, cirrhosis due to hepatitis C, hepatocellular carcinoma   Left Hip/Back pain. 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 Xray of hip/lumbar/sacral within normal limits. Concern for possible bony mets given pain is not well controlled on tramadol - MRI to check for mets per palliative - PT/OT for placement   Hepatocellular Carcinoma- followed by medical oncologist Dr. Shirleen Schirmer at [facility]. Not receiving any chemotherapy currently. Metastasis throughout  - held home cholestyramine due to c. Dif  - Consulted palliative to discuss options for patient, patient amenable to this   C. Diff initial recurrence- Improved. Only 1 BM yesterday.  - discontinued vancomycin per ID (12/19 > 12/23) - Continue Dificid for a total of 10 days (12/19 > ?), day 8 of 10   Abdominal Pain, improved -  3L obtained via paracentesis off on 12/22 - Will continue to follow with daily exams  - repeat U/S to evaluate for reoccurrence of ascites, if reaccumulating may benefit from pleurx per palliative  A fib with RVR- on diltiazem and eliquis at home. -continue home medications -continue to monitor HR   Anemia baseline Hgb 10. Hgb 7.4> IV Iron > 7.8>1U RBC > 8.7 >8.6.  - FOBT pending  - Continue protonix for GI ppx - monitor CBC  FEN/GI: regular diet Prophylaxis: on eliquis  Disposition: home with HH, pending PT eval  Subjective:  States continues  to have L hip pain that is not relieved by tramadol but also did not realize that he needed to ask for pain medication. Is especially worse with standing and dislikes feeling weak. Has only had 2 loose BM in the last 24 hours. Daughter [name] at bedside and asking if father has poor prognosis, if so would like to speak with palliative care and consider SSRI.  Objective: Temp:  [97.6 F (36.4 C)-98.5 F (36.9 C)] 98.4 F (36.9 C) (12/26 3151) Pulse Rate:  [83-88] 87 (12/26 0632) Resp:  [17-21] 20 (12/26 7616) BP: (104-120)/(54-67) 110/61 (12/26 0737) SpO2:  [94 %-99 %] 95 % (12/26 1062) Weight:  [82.7 kg (182 lb 5.1 oz)] 82.7 kg (182 lb 5.1 oz) (12/25 2101) Physical Exam: General: elderly man  in NAD, lying in bed Cardiovascular: RRR, no murmurs  Respiratory: CTA bilaterally no increased work of breathing Abdomen: +BS, distended, soft, non-tender.  Extremities: Warm, well perfused, no edema or cyanosis MSK: L hip not tender to palpation but reduced active ROM, only able to sit up to 45 degrees due to pain   Laboratory:  Recent Labs Lab 08/07/16 0449 08/08/16 0503 08/08/16 1633 08/09/16 0618  WBC 2.8* 3.3*  --  3.9*  HGB 7.4* 7.8* 8.7* 8.6*  HCT 22.7* 23.2* 26.4* 26.1*  PLT 54* 66*  --  72*    Recent Labs Lab 08/04/16 0707 08/04/16 1049 08/05/16 0558 08/06/16 0405  NA 142  --  140 139  K 3.4*  --  3.7 3.8  CL 116*  --  113* 111  CO2 21*  --  23 24  BUN 20  --  22* 20  CREATININE 0.96  --  1.05 0.92  CALCIUM 8.3*  --  8.5* 8.5*  PROT  --  5.3*  --   --   BILITOT  --  1.4*  --   --   ALKPHOS  --  54  --   --   ALT  --  45  --   --   AST  --  65*  --   --   GLUCOSE 100*  --  106* 121*    Paracentesis: straw colored, glucose 157, LD 28, total protein < 3,  196 WBC Gram stain WBCs, predominantly mononuclear cells.   Imaging/Diagnostic Tests: No results found.  Bufford Lope, DO 08/09/2016, 7:13 AM PGY-1, [place] Intern pager: [phone],  text pages welcome

## 2016-08-09 NOTE — Consult Note (Signed)
Consultation Note Date: 08/09/2016   Patient Name: Lee Allen  DOB: 09-15-1924  MRN: [id number]  Age / Sex: 82 y.o., male  PCP: Lee Bender, PA-C Referring Physician: Alveda Reasons, MD  Reason for Consultation: Establishing goals of care, Pain control and Psychosocial/spiritual support  HPI/Patient Profile: 82 y.o. male  with past medical history of cirrhosis due to hepatitis C, metastatic hepatocellular carcinoma (followed by Lee Allen at [facility]), A. Fib with RVR, and recent C. Diff who was admitted on 08/01/2016 with abdominal pain. He is s/p paracentesis on 12/22 with 3L removed.    Clinical Assessment and Goals of Care: Lee Allen was resting in bed on my arrival to his bedside. After introducing myself, we discussed his perception of his heath prior to admission, his understanding of this admission, and his expectations for the future. Prior to hospitalization, he recounted a sharp decline in his health over the past three weeks. He has felt increasingly weak, tired, and with a decreased appetite. He also noted a progressively worsening lower back pain. This pain feels like a deep stabbing, that is aggravated by movement and relieved by lying still. There is no associated radiation of pain or burning sensation. He feels the pain is debilitating.  In terms of his expectations for the future, he was very frank about his poor prognosis. He relayed that he didn't feel like he had long to live, and was at peace with this. His biggest hope was to be at his own home and spend time with his family. Along these lines, I brought-up the possibility of utilizing Hospice. He was familiar with their services and agreed that they would be helpful. He did say, however, that it's hard to imagine living or pursuing treatment when he is experiencing this excruciating back pain. I explored this further, and he expressed a desire for more treatment for his [name] if his pain  could be better managed. I asked if he would want more cancer treatment if it meant he wouldn't be eligible for Hospice, and he said "yes, if my pain is better."  With his permission, I called his daughter Lee Allen and related our conversation to her. She also acknowledged his acute decline and understood he may be at the end of his life. She was also focused on pain management, and felt that his decisions about goals of care and the future were strongly influenced by his pain status--and his choice would change with controlled or uncontrolled pain.   Primary Decision Maker PATIENT    SUMMARY OF RECOMMENDATIONS    I would recommend MRI lower spine to more fully investigate etiology of pain. If there is a metastatic lesion, could involve Rad Onc for evaluation of palliative treatment. If other etiology, will have to discuss with pt treatment options (if any)  For pain, will start scheduled Oxycodone '5mg'$  q6H scheduled and q4H PRN, will also start Morphine '2mg'$  q3H PRN for pain unrelieved by oral PRN medication  I would also recommend re-imaging abdomen for evaluation of recurrent ascites. If returning, I can discuss placement of palliative abdominal pleurX catheter  Plan to meet with pt and daughter tomorrow at [place]:  Full code; working on pain management today. I will plan to discuss code status with him tomorrow (12/27) when his daughter is present  Palliative Prophylaxis:   Frequent Pain Assessment  Additional Recommendations (Limitations, Scope, Preferences):  TBD  Psycho-social/Spiritual:   Desire for further Chaplaincy support:no  Additional Recommendations:  TBD  Prognosis:   < 6 months  Discharge Planning: To Be Determined      Primary Diagnoses: Present on Admission: . Other ascites . Generalized abdominal pain   I have reviewed the medical record, interviewed the patient and family, and examined the patient. The following aspects  are pertinent.  Past Medical History:  Diagnosis Date  . Ascending aorta dilation (HCC)    4 cm by chest CT 2/14  . Atrial flutter ([place])    09/2012 s/p TEE/DCCV  . C. difficile diarrhea 07/2016  . CAD (coronary artery disease)    coronary calcification by chest CT 09/2012  . Dyspnea   . GERD (gastroesophageal reflux disease)   . [place] (hepatocellular carcinoma) ([place])    s/p ablation, with extension to Abdominal/chest wall  . Hepatitis C   . History of tobacco abuse    quit 1972  . Liver cirrhosis ([place])   . Nephrolithiasis   . Shingles   . Skin cancer    Social History   Social History  . Marital status: Married    Spouse name: N/A  . Number of children: N/A  . Years of education: N/A   Social History Main Topics  . Smoking status: Former Smoker    Types: Cigarettes    Quit date: 08/15/1970  . Smokeless tobacco: Never Used  . Alcohol use 1.8 oz/week    3 Cans of beer per week     Comment: occasionally  . Drug use: No  . Sexual activity: Not Asked   Other Topics Concern  . None   Social History Narrative  . None   Family History  Problem Relation Age of Onset  . Leukemia Father    Scheduled Meds: . apixaban  5 mg Oral BID  . diltiazem  360 mg Oral Daily  . feeding supplement (ENSURE ENLIVE)  237 mL Oral BID BM  . fidaxomicin  200 mg Oral BID  . lactobacillus acidophilus  2 tablet Oral TID  . pantoprazole  40 mg Oral Daily   Continuous Infusions: PRN Meds:.acetaminophen **OR** acetaminophen, traMADol Allergies  Allergen Reactions  . 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  HENT: Negative for congestion, hearing loss, sinus pressure and trouble swallowing.        Dry mouth  Eyes: Negative for visual disturbance.  Respiratory: Positive for shortness of breath.  Negative for chest tightness and wheezing.   Gastrointestinal: Positive for abdominal distention, abdominal pain and diarrhea (recurrent c. diff). Negative for constipation, nausea and vomiting.  Musculoskeletal: Positive for back pain and gait problem (r/t pain). Negative for neck pain and neck stiffness.  Skin: Positive for pallor.  Neurological: Positive for weakness. Negative for dizziness, speech difficulty and headaches.  Psychiatric/Behavioral: Negative for confusion, hallucinations and sleep disturbance. The patient is nervous/anxious.     Physical Exam  Constitutional: He is oriented to person, place, and time.  Frail and sickly appearing man in bed  HENT:  Head: Normocephalic and atraumatic.  Mouth/Throat: Mucous membranes are dry.  Eyes: EOM are normal.  Neck: Normal range of motion.  Cardiovascular: Normal rate.   Pulmonary/Chest: No accessory muscle usage. Tachypnea noted. No respiratory distress. He has decreased breath sounds in the right lower field and the left lower field. He has no wheezes. He has no rhonchi. He  has no rales.  Increased WOB with prolonged conversation. Pt unable to sit fully upright without feeling more SOB.  Abdominal: He exhibits distension. There is no tenderness.  Musculoskeletal: Normal range of motion.  Neurological: He is alert and oriented to person, place, and time.  Skin: Skin is warm and dry. There is pallor.  Psychiatric: He has a normal mood and affect. His behavior is normal. Judgment and thought content normal.    Vital Signs: BP 110/61 (BP Location: Right Arm)   Pulse 87   Temp 98.4 F (36.9 C) (Oral)   Resp 20   Ht '5\' 8"'$  (1.727 m)   Wt 82.7 kg (182 lb 5.1 oz)   SpO2 95%   BMI 27.72 kg/m  Pain Assessment: No/denies pain   Pain Score: 0-No pain   SpO2: SpO2: 95 % O2 Device:SpO2: 95 % O2 Flow Rate: .   IO: Intake/output summary:  Intake/Output Summary (Last 24 hours) at 08/09/16 0814 Last data filed at 08/09/16 8867   Gross per 24 hour  Intake             1174 ml  Output              975 ml  Net              199 ml    LBM: Last BM Date: 08/08/16 Baseline Weight: Weight: 79.6 kg (175 lb 8 oz) Most recent weight: Weight: 82.7 kg (182 lb 5.1 oz)     Palliative Assessment/Data:  PPS 50%    Time In: 1130 Time Out: 1300 Time Total: 90 minutes Greater than 50%  of this time was spent counseling and coordinating care related to the above assessment and plan.  Signed by: Charlynn Court, NP Palliative Medicine Team Pager # [phone] (M-F 7a-5p) Team Phone # [phone] (Nights/Weekends)

## 2016-08-09 NOTE — Care Management Important Message (Signed)
Important Message  Patient Details  Name: Lee Allen MRN: [id number] Date of Birth: January 22, 1925   Medicare Important Message Given:  Yes    Wyatt Galvan, Rory Percy, RN 08/09/2016, 10:59 AM

## 2016-08-09 NOTE — Progress Notes (Signed)
PT Cancellation Note  Patient Details Name: DONTAI PEMBER MRN: [id number] DOB: 12-19-1924   Cancelled Treatment:    Reason Eval/Treat Not Completed: Patient declined, no reason specified.  "Not today.Marland KitchenMarland KitchenJust worn out."  08/09/2016  Donnella Sham, PT [phone]  (pager)  Tessie Fass Nevaeha Finerty 08/09/2016, 4:46 PM

## 2016-08-09 NOTE — Care Management (Addendum)
Adron Bene, RN Case Manager Signed CASE MANAGEMENT Care Management Note Date of Service: 08/05/2016 3:57 PM      '[]'$ Hide copied text '[]'$ Hover for attribution information Case Management Note  Patient Details  Name: Lee Allen MRN: [id number] Date of Birth: [birth year]  Subjective/Objective:      CM following for progression and d/c planning.               Action/Plan: 08/05/2016 Noted plan to d/c pt to home with [facility] services. Pt currently active with [facility] for HHPT and HHOT. This Case manager added [facility] for eval and assessment in the home post d/c and [facility] notified of d/c needs.   Expected Discharge Date:  08/10/2016                    Expected Discharge Plan:  [place]  In-House Referral:  NA  Discharge planning Services  CM Consult 08/09/2016 Noted CM referral re Dificid, this pt has Medicare and a secondary insurance with medication benefits. We are unable to assist with medications.  Post Acute Care Choice:  Home Health Choice offered to:  Patient  DME Arranged:  N/A DME Agency:  NA  HH Arranged:  RN, PT, OT [place] Agency:  [place]  Status of Service:  Completed, signed off  If discussed at [place] of Stay Meetings, dates discussed:    Additional Comments:  Adron Bene, RN 08/05/2016, 3:57 PM

## 2016-08-10 ENCOUNTER — Inpatient Hospital Stay (HOSPITAL_COMMUNITY): Payer: Medicare Other

## 2016-08-10 DIAGNOSIS — C61 Malignant neoplasm of prostate: Secondary | ICD-10-CM

## 2016-08-10 DIAGNOSIS — M25552 Pain in left hip: Secondary | ICD-10-CM

## 2016-08-10 DIAGNOSIS — C3490 Malignant neoplasm of unspecified part of unspecified bronchus or lung: Secondary | ICD-10-CM

## 2016-08-10 DIAGNOSIS — C7951 Secondary malignant neoplasm of bone: Secondary | ICD-10-CM

## 2016-08-10 DIAGNOSIS — R530 Neoplastic (malignant) related fatigue: Secondary | ICD-10-CM

## 2016-08-10 LAB — BASIC METABOLIC PANEL
Anion gap: 10 (ref 5–15)
BUN: 23 mg/dL — AB (ref 6–20)
CALCIUM: 8.8 mg/dL — AB (ref 8.9–10.3)
CO2: 20 mmol/L — ABNORMAL LOW (ref 22–32)
CREATININE: 1.18 mg/dL (ref 0.61–1.24)
Chloride: 108 mmol/L (ref 101–111)
GFR calc Af Amer: >60 mL/min (ref >60)
GFR, EST NON AFRICAN AMERICAN: 53 mL/min — AB (ref >60)
GLUCOSE: 121 mg/dL — AB (ref 65–99)
POTASSIUM: 4 mmol/L (ref 3.5–5.1)
Sodium: 138 mmol/L (ref 135–145)

## 2016-08-10 LAB — CBC
HCT: 26.2 % — ABNORMAL LOW (ref 39.0–52.0)
Hemoglobin: 8.5 g/dL — ABNORMAL LOW (ref 13.0–17.0)
MCH: 30.7 pg (ref 26.0–34.0)
MCHC: 32.4 g/dL (ref 30.0–36.0)
MCV: 94.6 fL (ref 78.0–100.0)
PLATELETS: 80 10*3/uL — AB (ref 150–400)
RBC: 2.77 MIL/uL — ABNORMAL LOW (ref 4.22–5.81)
RDW: 17.9 % — AB (ref 11.5–15.5)
WBC: 4.8 10*3/uL (ref 4.0–10.5)

## 2016-08-10 LAB — PROTIME-INR
INR: 1.57
Prothrombin Time: 18.9 seconds — ABNORMAL HIGH (ref 11.4–15.2)

## 2016-08-10 LAB — CULTURE, BODY FLUID-BOTTLE

## 2016-08-10 LAB — CULTURE, BODY FLUID W GRAM STAIN -BOTTLE: Culture: NO GROWTH

## 2016-08-10 MED ORDER — ACETAMINOPHEN 325 MG PO TABS
650.0000 mg | ORAL_TABLET | Freq: Four times a day (QID) | ORAL | Status: DC
  Administered 2016-08-10 – 2016-08-12 (×8): 650 mg via ORAL
  Filled 2016-08-10 (×8): qty 2

## 2016-08-10 MED ORDER — APIXABAN 5 MG PO TABS
5.0000 mg | ORAL_TABLET | Freq: Two times a day (BID) | ORAL | Status: DC
Start: 2016-08-13 — End: 2016-08-10

## 2016-08-10 MED ORDER — VANCOMYCIN HCL IN DEXTROSE 1-5 GM/200ML-% IV SOLN
1000.0000 mg | INTRAVENOUS | Status: AC
  Administered 2016-08-12: 1000 mg via INTRAVENOUS
  Filled 2016-08-10: qty 200

## 2016-08-10 MED ORDER — OXYCODONE HCL 5 MG PO TABS
10.0000 mg | ORAL_TABLET | ORAL | Status: DC
  Administered 2016-08-10 – 2016-08-11 (×4): 10 mg via ORAL
  Filled 2016-08-10 (×5): qty 2

## 2016-08-10 MED ORDER — GADOBENATE DIMEGLUMINE 529 MG/ML IV SOLN
20.0000 mL | Freq: Once | INTRAVENOUS | Status: AC
  Administered 2016-08-10: 20 mL via INTRAVENOUS

## 2016-08-10 MED ORDER — CALCITONIN (SALMON) 200 UNIT/ACT NA SOLN
1.0000 | Freq: Every day | NASAL | Status: DC
  Administered 2016-08-11: 1 via NASAL
  Filled 2016-08-10: qty 3.7

## 2016-08-10 MED ORDER — LIDOCAINE 5 % EX PTCH
1.0000 | MEDICATED_PATCH | CUTANEOUS | Status: DC
  Administered 2016-08-10 – 2016-08-12 (×3): 1 via TRANSDERMAL
  Filled 2016-08-10 (×3): qty 1

## 2016-08-10 MED ORDER — OLANZAPINE 5 MG PO TABS
2.5000 mg | ORAL_TABLET | Freq: Every day | ORAL | Status: DC
  Administered 2016-08-10 – 2016-08-11 (×2): 2.5 mg via ORAL
  Filled 2016-08-10 (×2): qty 1

## 2016-08-10 MED ORDER — OXYCODONE HCL 5 MG PO TABS: 10.0000 mg | ORAL_TABLET | ORAL | Status: DC | PRN

## 2016-08-10 MED ORDER — DEXAMETHASONE 4 MG PO TABS
4.0000 mg | ORAL_TABLET | Freq: Four times a day (QID) | ORAL | Status: DC
  Administered 2016-08-10 – 2016-08-11 (×4): 4 mg via ORAL
  Filled 2016-08-10 (×4): qty 1

## 2016-08-10 MED ORDER — ENSURE ENLIVE PO LIQD
237.0000 mL | Freq: Three times a day (TID) | ORAL | Status: DC
  Administered 2016-08-11 – 2016-08-12 (×3): 237 mL via ORAL

## 2016-08-10 NOTE — Consult Note (Signed)
   [facility] CM Inpatient Consult   08/10/2016  IOANE BHOLA [birth year] [id number]   Chart reviewed for re-admission. Patient is in [place].  Chart review reveals the patient is Marquay Kruse is 82 year old with PMH of cirrhosis, hepatocellular carcinoma, atrial fibrillation, and recent C. Diff infection who presented to [name] ED on 08/01/16 with abdominal distention and diarrhea. He was admitted to [facility] for management. He was started on oral vancomycin for initial recurrence of C. Diff colitis.  Chart reviewed and noted that the patient's family wishes for [facility] for home services at discharge.  [facility] will not follow as the patient will have care management services with [facility] per inpatient [facility] as the current plan.  For questions, please contact:  Natividad Brood, RN BSN [facility] Liaison  [phone] business mobile phone Toll free office [phone]

## 2016-08-10 NOTE — Consult Note (Signed)
Chief Complaint: Patient was seen in consultation today for recurrent ascites  Referring Physician(s):  Dr. Esmond Camper  Supervising Physician: Jacqulynn Cadet  Patient Status: [facility] - In-pt  History of Present Illness: Lee Allen is a 82 y.o. male with history of metastatic HCC, cirrhosis, Hepatitis C, A fib with RVR, and recent C. Diff colitis who presented to [facility] 12/18 with abdominal pain and distention.  Patient is s/p first paracentesis 12/22 and has since had re-accumulation of his ascites. Patient is followed by Palliative Care team who recommended a PleurX catheter as patient is planning to discharge home with hospice.   IR consulted for possible PleurX catheter placement.  Case reviewed by Dr. Laurence Ferrari who feels patient is appropriate for procedure.   There was discussion re: paracentesis today, however will plan to move forward with PleurX placement and will not perform paracentesis today.   Note patient is on Eliquis, last given today at 11AM.   Past Medical History:  Diagnosis Date  . Ascending aorta dilation (HCC)    4 cm by chest CT 2/14  . Atrial flutter ([place])    09/2012 s/p TEE/DCCV  . C. difficile diarrhea 07/2016  . CAD (coronary artery disease)    coronary calcification by chest CT 09/2012  . Dyspnea   . GERD (gastroesophageal reflux disease)   . [place] (hepatocellular carcinoma) ([place])    s/p ablation, with extension to Abdominal/chest wall  . Hepatitis C   . History of tobacco abuse    quit 1972  . Liver cirrhosis ([place])   . Nephrolithiasis   . Shingles   . Skin cancer     Past Surgical History:  Procedure Laterality Date  . ABLATION  2012   liver  . CARDIOVERSION N/A 10/04/2012   Procedure: CARDIOVERSION;  Surgeon: Peter M Martinique, MD;  Location: [facility] ENDOSCOPY;  Service: Cardiovascular;  Laterality: N/A;  . LITHOTRIPSY    . SKIN CANCER EXCISION    . TEE WITHOUT CARDIOVERSION N/A 10/04/2012   Procedure: TRANSESOPHAGEAL ECHOCARDIOGRAM (TEE);   Surgeon: Peter M Martinique, MD;  Location: [facility] ENDOSCOPY;  Service: Cardiovascular;  Laterality: N/A;    Allergies: Penicillins  Medications: Prior to Admission medications   Medication Sig Start Date End Date Taking? Authorizing Provider  acetaminophen (TYLENOL) 325 MG tablet Take 650 mg by mouth every 6 (six) hours as needed (pain).    Yes Historical Provider, MD  apixaban (ELIQUIS) 5 MG TABS tablet Take 1 tablet (5 mg total) by mouth 2 (two) times daily. 07/23/16  Yes Alyssa A Lincoln Brigham, MD  cholestyramine (QUESTRAN) 4 g packet Take 4 g by mouth daily as needed (itching).  07/13/16  Yes Historical Provider, MD  diltiazem (CARDIZEM CD) 360 MG 24 hr capsule Take 1 capsule (360 mg total) by mouth daily. 07/24/16  Yes Alyssa A Lincoln Brigham, MD  ibuprofen (ADVIL,MOTRIN) 200 MG tablet Take 200-400 mg by mouth every 6 (six) hours as needed (pain).   Yes Historical Provider, MD  Menthol, Topical Analgesic, (MENTHOL EX) Apply 1 application topically 4 (four) times daily as needed (itching).   Yes Historical Provider, MD     Family History  Problem Relation Age of Onset  . Leukemia Father     Social History   Social History  . Marital status: Married    Spouse name: N/A  . Number of children: N/A  . Years of education: N/A   Social History Main Topics  . Smoking status: Former Smoker    Types: Cigarettes  Quit date: 08/15/1970  . Smokeless tobacco: Never Used  . Alcohol use 1.8 oz/week    3 Cans of beer per week     Comment: occasionally  . Drug use: No  . Sexual activity: Not Asked   Other Topics Concern  . None   Social History Narrative  . None     Review of Systems  Constitutional: Positive for activity change, appetite change and fatigue.  Respiratory: Negative for cough and shortness of breath.   Cardiovascular: Negative for chest pain.  Gastrointestinal: Positive for abdominal pain.  Psychiatric/Behavioral: Negative for behavioral problems and confusion.    Vital Signs: BP  128/78 (BP Location: Left Arm)   Pulse 90   Temp 97.6 F (36.4 C) (Oral)   Resp 16   Ht '5\' 8"'$  (1.727 m)   Wt 182 lb 5.1 oz (82.7 kg)   SpO2 96%   BMI 27.72 kg/m   Physical Exam  Constitutional: He is oriented to person, place, and time. He appears well-developed.  Cardiovascular: Normal rate and normal heart sounds.   Pulmonary/Chest: Effort normal. No respiratory distress.  Abdominal: He exhibits distension.  Neurological: He is alert and oriented to person, place, and time.  Skin: Skin is warm and dry.  Psychiatric: He has a normal mood and affect. His behavior is normal. Judgment and thought content normal.  Nursing note and vitals reviewed.   Mallampati Score:  MD Evaluation Airway: WNL Heart: WNL Abdomen: WNL Chest/ Lungs: WNL ASA  Classification: 3 Mallampati/Airway Score: Two  Imaging: Dg Chest 2 View  Result Date: 08/01/2016 CLINICAL DATA:  Initial evaluation for acute shortness of breath for 3 days, worsened tonight. EXAM: CHEST  2 VIEW COMPARISON:  Prior radiograph from 07/19/2016. FINDINGS: Mild cardiomegaly, stable. Mediastinal silhouette within normal limits. Tortuosity the intrathoracic aorta noted. Mildly hypoinflated. No focal infiltrates to suggest pneumonia identified. No pulmonary edema or pleural effusion. No pneumothorax. Bones are diffusely osteopenic.  No acute osseous abnormality. IMPRESSION: No active cardiopulmonary disease identified. Electronically Signed   By: Jeannine Boga M.D.   On: 08/01/2016 22:02   Dg Chest 2 View  Result Date: 07/19/2016 CLINICAL DATA:  83 y/o  M; dyspnea. EXAM: CHEST  2 VIEW COMPARISON:  07/15/2016 chest radiograph FINDINGS: Stable borderline cardiomegaly given projection and technique. Low lung volumes accentuate pulmonary markings. Patchy right basilar opacity probably represents atelectasis. No pleural effusion. No pneumothorax. Bones are unremarkable. IMPRESSION: Low lung volumes. Right basilar opacity is probably  atelectasis. No acute pulmonary process. Electronically Signed   By: Kristine Garbe M.D.   On: 07/19/2016 20:35   Dg Lumbar Spine 2-3 Views  Result Date: 08/07/2016 CLINICAL DATA:  Left hip/back pain, hepatocellular carcinoma with metastases to spinal cord EXAM: LUMBAR SPINE - 2-3 VIEW COMPARISON:  CT abdomen/ pelvis dated 07/15/2016 FINDINGS: Five lumbar-type vertebral bodies. Normal lumbar lordosis. No evidence of fracture or dislocation. Vertebral body heights are maintained. Mild to moderate multilevel degenerative changes. Visualized bony pelvis appears intact. IMPRESSION: No fracture or dislocation is seen. Mild to moderate multilevel degenerative changes. Electronically Signed   By: Julian Hy M.D.   On: 08/07/2016 15:20   Dg Sacrum/coccyx  Result Date: 08/07/2016 CLINICAL DATA:  Left hip/ back pain, metastatic carcinoma with metastases to spinal cord EXAM: SACRUM AND COCCYX - 2+ VIEW COMPARISON:  None. FINDINGS: There is no evidence of fracture or other focal bone lesions. Degenerative changes of the lumbar spine. Vascular calcifications. Embolization coils overlying the right groin. IMPRESSION: Negative. Electronically Signed  By: Julian Hy M.D.   On: 08/07/2016 15:21   Dg Abd 1 View  Result Date: 08/02/2016 CLINICAL DATA:  Abdominal distention. EXAM: ABDOMEN - 1 VIEW COMPARISON:  CT 07/15/2016. FINDINGS: Soft tissue structures are unremarkable. No bowel distention. No free air. Degenerative changes lumbar spine with scoliosis concave left. Surgical coils noted over the right groin. IMPRESSION: No acute abnormality identified. Electronically Signed   By: Marcello Moores  Register   On: 08/02/2016 14:44   Mr Lumbar Spine W Wo Contrast  Result Date: 08/10/2016 CLINICAL DATA:  Metastatic carcinoma. Recent decompressive surgery in November. Persistent back pain. EXAM: MRI LUMBAR SPINE WITHOUT AND WITH CONTRAST TECHNIQUE: Multiplanar and multiecho pulse sequences of the  lumbar spine were obtained without and with intravenous contrast. CONTRAST:  17 cc MultiHance COMPARISON:  Lumbar MRI 08/13/2015.  CT abdomen pelvis 07/15/2016. FINDINGS: Overall image analysis shows image degradation presumably secondary to the large amount of ascites. Tissue T1 hyperintensity is present before gadolinium was administered. I do not have a definite explanation for that. Perhaps there was imaging done elsewhere with gadolinium administration. The pre and postcontrast axial T1 images appear essentially the same. Segmentation:  5 lumbar type vertebral bodies. Alignment:  Curvature convex to the right with the apex at L3. Vertebrae: Previous posterior decompression at L3 and the debulking of previously seen left-sided predominant tumor within the L3 vertebral body. There is residual tumor within the right posterior vertebral body encroaching upon the right epidural space. This region of tumor measures about 2.6 cm in diameter. Because of the posterior decompression, this does not compress the thecal sac severely. Tumor on the right could affect the right L3 nerve root. There is edema and enhancement throughout the L3 vertebral body that is presumed to relate to edema secondary to the tumor and recent tumor debulking surgery. Osteomyelitis of the L3 vertebral body is not excluded by imaging but I do not strongly suspect that. Changes at the L2-3 disc level are most consistent with discogenic change. I do not see any other foci of tumor within the region imaged. There is a fairly large amount of fluid along the left side of the thecal sac and tracking backward along the operative approach at L3. This could be postoperative hematoma/seroma. The possibility of CSF leak does exist. This fluid could be sterile or infected. It could be easily sampled because of its proximity to the skin at the incision site. Conus medullaris: Extends to the T12-L1 level and appears normal. Paraspinal and other soft tissues:  See above discussion regarding fluid collection along the operative approach to the L3 decompression. Differential diagnosis is hematoma seroma versus CSF leak versus infected fluid. Disc levels: T12-L1 and L1-2: Unremarkable. L2-3: Degenerative disc disease worse on the left the disc space narrowing and left-sided discogenic changes. Mild stenosis of the left lateral recess because of osteophytes and bulging of the disc. L3-4: Disc level itself appears unremarkable. See above for discussion of residual tumor centrally and to the right at the posterior L3 vertebral body and fluid along the left side of the thecal sac and along the operative approach. L4-5: Bulging of the disc. Mild facet and ligamentous hypertrophy. Mild stenosis of the lateral recesses without definite neural compression. L5-S1: Disc degeneration with endplate osteophytes and bulging of the disc. Right-sided annular fissures. Bilateral facet osteoarthritis. Mild bilateral foraminal stenosis. IMPRESSION: Precontrast T1 tissue hyperintensity of uncertain etiology. See above discussion. Recent posterior decompression at L3 with tumor resection on the left. Residual tumor in the  posterior L3 vertebral body centrally and to the right with extension into the ventral epidural space on the right which could affect the right-sided nerve roots. Thecal sac is not severely compressed. No other location of tumor is identified. Fluid within the spinal canal along the left side of the thecal sac at the L3 level, tracking backward along the operative approach to the skin. This could be hematoma/ seroma, CSF leak or infected fluid. Edema and enhancement throughout the L3 vertebral body presumed to be secondary to a combination of tumor, tumor related edema and postsurgical edema. The possibility of osteomyelitis is not excluded but not certain. Electronically Signed   By: Nelson Chimes M.D.   On: 08/10/2016 08:54   US Abdomen Complete  Result Date:  08/02/2016 CLINICAL DATA:  Abdominal pain and distention, hepatic cirrhosis. EXAM: ABDOMEN ULTRASOUND COMPLETE COMPARISON:  CT scan of July 15, 2016. FINDINGS: Gallbladder: No gallstones visualized. No sonographic Murphy sign noted by sonographer. Mild gallbladder wall thickening is noted at 4 mm due to surrounding ascites. Common bile duct: Diameter: 4.1 mm which is within normal limits. Liver: Heterogeneous echotexture of hepatic parenchyma is noted with nodular hepatic contours consistent with hepatic cirrhosis. 3.5 x 3.1 x 2.4 cm solid mass is noted in right hepatic lobe which corresponds to neoplasm to have been previously treated with ablation. IVC: No abnormality visualized. Pancreas: Not well visualized. Spleen: Maximum measured diameter 12.6 cm with calculated volume of 621 cubic cm consistent with mild splenomegaly. Right Kidney: Length: 12.8 cm. Multiple simple cysts are noted with the largest measuring 2.6 cm. Increased echogenicity of renal parenchyma is noted consistent with medical renal disease. No mass or hydronephrosis visualized. Left Kidney: Length: 12.4 cm. Multiple cysts are noted with the largest measuring 4.7 cm. Increased echogenicity of renal parenchyma is noted consistent with medical renal disease. No mass or hydronephrosis visualized. Abdominal aorta: 3.4 cm abdominal aortic aneurysm. Other findings: Moderate ascites is noted. IMPRESSION: Hepatic cirrhosis with mild splenomegaly and associated moderate ascites. 3.5 cm solid mass is seen in right hepatic lobe which corresponds to neoplasm which has undergone previous ablation. 3.4 cm abdominal aortic aneurysm. Increased echogenicity of renal parenchyma is noted bilaterally suggesting medical renal disease. Electronically Signed   By: Marijo Conception, M.D.   On: 08/02/2016 16:31   Ct Abdomen Pelvis W Contrast  Result Date: 07/15/2016 CLINICAL DATA:  Low back pain near hips for 2 weeks. History of cirrhosis and metastatic  hepatocellular carcinoma with L3 metastasis. EXAM: CT ABDOMEN AND PELVIS WITH CONTRAST TECHNIQUE: Multidetector CT imaging of the abdomen and pelvis was performed using the standard protocol following bolus administration of intravenous contrast. CONTRAST:  74m ISOVUE-300 IOPAMIDOL (ISOVUE-300) INJECTION 61% COMPARISON:  MRI of liver August 31, 2015 and MRI of the lumbar spine August 13, 2015 and CTA aorta June 04, 2014. Of the of an CT abdomen and pelvis November 15, 2013 FINDINGS: LOWER CHEST: Lung bases are clear. The heart is mildly enlarged. Mild coronary artery calcifications. No pericardial effusions. HEPATOBILIARY: 10 x 15 mm lesion segment 5/6 is stable to slightly smaller than prior MRI consistent with treated neoplasm. Multiple hepatic granulomas. Lobular liver contour with overall small liver consistent with cirrhosis. Enlarged, patent main portal vein with recannulized periumbilical vein. PANCREAS: Stable 1.7 x 2.3 cm cystic lesion body of the pancreas. SPLEEN: Worsening splenomegaly. ADRENALS/URINARY TRACT: Kidneys are orthotopic, demonstrating symmetric enhancement. No nephrolithiasis, hydronephrosis or solid renal masses. Multiple benign appearing renal cysts measure up to 5.5 cm on the  LEFT. The unopacified ureters are normal in course and caliber. Delayed imaging through the kidneys demonstrates symmetric prompt contrast excretion within the proximal urinary collecting system. Urinary bladder is partially distended and unremarkable. 2 x 3.2 cm LEFT adrenal mass. Thickened RIGHT adrenal gland. STOMACH/BOWEL: Thickened rectum with perirectal fat stranding. Mild diffuse colonic wall thickening, pericolonic fat stranding. Normal appendix. No bowel obstruction. VASCULAR/LYMPHATIC: Aortoiliac vessels are mildly ectatic with moderate calcific atherosclerosis. Matted abnormal soft tissue about the celiac axis. REPRODUCTIVE: Mild prostatomegaly with coarse calcifications. OTHER: Trace free fluid in LEFT  pericolic gutter. No abscess or drainable fluid collections. No intraperitoneal free air. MUSCULOSKELETAL: 2.4 cm RIGHT chest wall intercostal mass corresponding to metastatic deposit on prior MRI. Metallic foreign body RIGHT pelvic wall suggest coil embolic material. Chronic deformity RIGHT iliac bone. Moderate degenerative change of the LEFT hip. Lytic lesion with expansile soft tissue component LEFT L3 vertebral body extending to the spinal canal, status post posterior decompression at this level. Tumor completely effaces the LEFT L3-4 neural foramen. IMPRESSION: Colitis/ proctitis without bowel obstruction. Findings of portal hypertension and cirrhosis with stable to smaller treated hepatic lesion. New 2 x 3.2 cm LEFT adrenal mass, with thickened RIGHT adrenal gland, in the setting of cancer this is most compatible with new metastasis fell. Abnormal soft tissue about the celiac axis most compatible with pathologic lymphadenopathy. Progressed L3 metastasis, status post interval posterior decompression. Severe LEFT L3-4 neural foraminal narrowing. Stable RIGHT chest wall metastasis. Electronically Signed   By: Elon Alas M.D.   On: 07/15/2016 16:39   US Abdomen Limited  Result Date: 08/10/2016 CLINICAL DATA:  Acute onset of generalized abdominal distention. Initial encounter. EXAM: LIMITED ABDOMEN ULTRASOUND FOR ASCITES TECHNIQUE: Limited ultrasound survey for ascites was performed in all four abdominal quadrants. COMPARISON:  CT of the abdomen and pelvis performed 07/15/2016 FINDINGS: Moderate ascites is noted within the abdomen and pelvis. IMPRESSION: Moderate abdominopelvic ascites noted. Electronically Signed   By: Garald Balding M.D.   On: 08/10/2016 00:29   US Paracentesis  Result Date: 08/05/2016 INDICATION: Cirrhosis, hepatitis-C, hepatocellular carcinoma, c diff infection, ascites; request made for diagnostic and therapeutic paracentesis. EXAM: ULTRASOUND GUIDED DIAGNOSTIC AND THERAPEUTIC  PARACENTESIS MEDICATIONS: None. COMPLICATIONS: None immediate. PROCEDURE: Informed written consent was obtained from the patient after a discussion of the risks, benefits and alternatives to treatment. A timeout was performed prior to the initiation of the procedure. Initial ultrasound scanning demonstrates a large amount of ascites within the right upper to mid abdominal quadrant. The right upper to mid abdomen was prepped and draped in the usual sterile fashion. 1% lidocaine was used for local anesthesia. Following this, a Yueh catheter was introduced. An ultrasound image was saved for documentation purposes. The paracentesis was performed. The catheter was removed and a dressing was applied. The patient tolerated the procedure well without immediate post procedural complication. FINDINGS: A total of approximately 3.1 liters of clear, light yellow fluid was removed. Samples were sent to the laboratory as requested by the clinical team. IMPRESSION: Successful ultrasound-guided diagnostic and therapeutic paracentesis yielding 3.1 liters of peritoneal fluid. Read by: Rowe Robert, PA-C Electronically Signed   By: Corrie Mckusick D.O.   On: 08/05/2016 16:37   Dg Chest Portable 1 View  Result Date: 07/15/2016 CLINICAL DATA:  Shortness of Breath EXAM: PORTABLE CHEST 1 VIEW COMPARISON:  None. FINDINGS: Cardiac shadow is at the upper limits of normal in size. Prominence of mediastinum is noted related to the portable technique. The lungs are well-aerated without focal  infiltrate or sizable effusion. No acute bony abnormality is seen. IMPRESSION: No active disease. Electronically Signed   By: Inez Catalina M.D.   On: 07/15/2016 15:28   Dg Hip Unilat With Pelvis 2-3 Views Left  Result Date: 08/07/2016 CLINICAL DATA:  Left hip and back pain. History of hepatocellular carcinoma metastasis. EXAM: DG HIP (WITH OR WITHOUT PELVIS) 2-3V LEFT COMPARISON:  CT 07/15/2016 FINDINGS: Lucency involving the L3 vertebral body is  compatible with a known lesion. There are small coils in the right inguinal region. Deformity of the right iliac wing is unchanged. No acute pelvic bony abnormality. Mild joint space narrowing in the left hip. There is no evidence for a left hip fracture or dislocation. Atherosclerotic calcifications. IMPRESSION: No acute bone abnormality in the pelvis or left hip. Stable bone changes as described. Electronically Signed   By: Markus Daft M.D.   On: 08/07/2016 15:24    Labs:  CBC:  Recent Labs  08/07/16 0449 08/08/16 0503 08/08/16 1633 08/09/16 0618 08/10/16 0629  WBC 2.8* 3.3*  --  3.9* 4.8  HGB 7.4* 7.8* 8.7* 8.6* 8.5*  HCT 22.7* 23.2* 26.4* 26.1* 26.2*  PLT 54* 66*  --  72* 80*    COAGS:  Recent Labs  07/16/16 0207 08/10/16 1111  INR 1.45 1.57    BMP:  Recent Labs  08/05/16 0558 08/06/16 0405 08/09/16 1312 08/10/16 0629  NA 140 139 138 138  K 3.7 3.8 3.9 4.0  CL 113* 111 107 108  CO2 '23 24 23 '$ 20*  GLUCOSE 106* 121* 151* 121*  BUN 22* 20 23* 23*  CALCIUM 8.5* 8.5* 8.8* 8.8*  CREATININE 1.05 0.92 1.15 1.18  GFRNONAA >60 >60 54* 53*  GFRAA >60 >60 >60 >60    LIVER FUNCTION TESTS:  Recent Labs  07/15/16 1256 07/16/16 0207  07/22/16 0417 07/23/16 0405 08/01/16 1654 08/04/16 1049  BILITOT 4.1* 2.8*  --   --   --  2.0* 1.4*  AST 39 30  --   --   --  71* 65*  ALT 23 21  --   --   --  55 45  ALKPHOS 44 38  --   --   --  65 54  PROT 6.1* 5.5*  --   --   --  6.6 5.3*  ALBUMIN 2.5* 2.4*  < > 2.3* 2.3* 2.6* 2.2*  < > = values in this interval not displayed.  TUMOR MARKERS: No results for input(s): AFPTM, CEA, CA199, CHROMGRNA in the last 8760 hours.  Assessment and Plan: Patient with history of metastatic HCC, cirrhosis, and Hep C now with recurrent ascites.  Patient is s/p first paracentesis 12/22.  He has since had rapid re-accumulation of his ascites.  Patient is planning to discharge home with hospice and requests a PleurX catheter for comfort.  Case  reviewed by Dr. Laurence Ferrari who feels patient is appropriate for procedure.   Patient last received his Eliquis today at 11AM.  Need to hold for 48 hrs. Will plan for procedure FRIDAY.  He will be NPO at midnight Thursday.  Risks and benefits discussed with the patient including, but not limited to bleeding, infection, peritonitis, or damage to adjacent structures. All of the patient's questions were answered, patient is agreeable to proceed. Consent signed and in chart.  Thank you for this interesting consult.  I greatly enjoyed meeting Lee Allen and look forward to participating in their care.  A copy of this report was sent to the  requesting provider on this date.  Electronically Signed: Docia Barrier 08/10/2016, 1:31 PM   I spent a total of 40 Minutes    in face to face in clinical consultation, greater than 50% of which was counseling/coordinating care for recurrent ascites

## 2016-08-10 NOTE — Progress Notes (Addendum)
Daily Progress Note   Patient Name: Lee Allen       Date: 08/10/2016 DOB: 1924-12-09  Age: 82 y.o. MRN#: [id number] Attending Physician: Alveda Reasons, MD Primary Care Physician: [name], PA-C Admit Date: 08/01/2016  Reason for Consultation/Follow-up: Disposition, Establishing goals of care, Pain control and Psychosocial/spiritual support  Subjective: Lee Allen appears much worse compared to yesterday. He is lying very stiff in bed, has a pained expression on his face, and is considerably more withdrawn compared to yesterday. He did sleep well overnight. He reports that his pain has improved a little bit with the use of Oxycodone, however it doesn't last very long, nor does it decrease his pain by that much. Today, he feels incredibly tired and weak, to the point that he doesn't want to move in bed. He has no appetite and has only eaten a little since yesterday. He also reports a difficulty with urination (no dysuria, just feels unable to void). On my arrival, he was very tearful talking with his daughter, [name].   Length of Stay: 8  Current Medications: Scheduled Meds:  . acetaminophen  650 mg Oral Q6H  . apixaban  5 mg Oral BID  . diltiazem  360 mg Oral Daily  . feeding supplement (ENSURE ENLIVE)  237 mL Oral BID BM  . fidaxomicin  200 mg Oral BID  . lactobacillus acidophilus  2 tablet Oral TID  . oxyCODONE  10 mg Oral Q4H  . pantoprazole  40 mg Oral Daily    Continuous Infusions:   PRN Meds: morphine injection, oxyCODONE  Physical Exam        Constitutional: He is oriented to person, place, and time.  Frail and sickly appearing man in bed. He appears emotionally distressed and very tearful.  HENT:  Head: Normocephalic and atraumatic.  Mouth/Throat: Mucous membranes are dry.  Eyes: EOM are normal.  Neck: Normal  range of motion.  Cardiovascular: Normal rate.   Pulmonary/Chest: No accessory muscle usage. Tachypnea noted. No respiratory distress. Increased WOB with prolonged conversation. Pt unable to sit fully upright without feeling more SOB.  Abdominal: He exhibits distension. There is no tenderness.  Musculoskeletal: Normal range of motion.  Neurological: He is alert and oriented to person, place, and time.  Skin: Skin is warm and dry. There is pallor.  Psychiatric: Pt very tearful and withdrawn.  Vital Signs: BP 127/73   Pulse 98   Temp 98.4 F (36.9 C)   Resp 18   Ht 5' 8" (1.727 m)   Wt 82.7 kg (182 lb 5.1 oz)   SpO2 90%   BMI 27.72 kg/m  SpO2: SpO2: 90 % O2 Device: O2 Device: Not Delivered O2 Flow Rate:    Intake/output summary:  Intake/Output Summary (Last 24 hours) at 08/10/16 0944 Last data filed at 08/10/16 0600  Gross per 24 hour  Intake              840 ml  Output              300 ml  Net              540 ml   LBM: Last BM Date: 08/08/16 Baseline Weight: Weight:  79.6 kg (175 lb 8 oz) Most recent weight: Weight: 82.7 kg (182 lb 5.1 oz)       Palliative Assessment/Data: PPS 40%    Patient Active Problem List   Diagnosis Date Noted  . Goals of care, counseling/discussion   . Acute low back pain without sciatica   . Hip pain   . Inanition ([place])   . Fatigue   . Generalized abdominal pain 08/02/2016  . Abdominal discomfort   . Diarrhea of presumed infectious origin   . Other ascites 08/01/2016  . Elevated troponin   . Dyspnea   . Mitral valve insufficiency   . Acute kidney injury ([place])   . Dehydration   . Hepatocellular carcinoma ([place])   . Enteritis due to Clostridium difficile   . Atrial fibrillation with RVR ([place]) 07/15/2016  . A-fib ([place]) 07/15/2016  . Blunt abdominal trauma 11/18/2013  . Thrombocytopenia ([place]) 11/18/2013  . Atrial fibrillation ([place]) 11/18/2013  . Anticoagulated on Coumadin 11/18/2013  . Hypovolemic shock ([place]) 11/18/2013  . Acute  blood loss anemia 11/17/2013  . Pelvic fracture ([place]) 11/15/2013  . Cirrhosis of liver due to hepatitis C 10/05/2012  . Arrhythmia 12/23/2010    Palliative Care Assessment & Plan   HPI: 82 y.o. male  with past medical history of cirrhosis due to hepatitis C, metastatic hepatocellular carcinoma (followed by Dr. Altamease Oiler at [facility]), A. Fib with RVR, and recent C. Diff who was admitted on 08/01/2016 with abdominal pain. He is s/p paracentesis on 12/22 with 3L removed. Repeat abd. US showed moderate ascites four days after tap. MRI Lumbar spine with residual tumor without cord compression, but with edema, and unable to exclude osteomyelitis.  Assessment: When I met with Lee Allen on 12/26 he appeared more comfortable, and reported less symptom burden. Today, he is extremely weak and tired, his pain remains (though slightly better since starting scheduled Oxycodone), and he is more withdrawn. When I met with him and his daughter, [name], at his bedside, he deferred to her for decisions.   I discussed the results of the ultrasound with them, and brought up the option of placing an abdominal PleurX catheter. I explained that the purpose would be to provide palliative relief from the re-accumulating ascites, and draining the fluid could occur at home if he felt symptomatic. They both agreed it would be beneficial, with the risk of infection less concerning than the symptomatic benefit it would provide.   We also discussed code status. Lee Allen was very clear that, in the even that his heart should stop beating or his lungs stopped breathing, he would want to die a natural death and be kept comfortable. I clarified that he would not want CPR, heart shocks, or a breathing tube placed, which he fully agreed with. His daughter was also present for this conversation.   Finally, we talked about discharge planning. Yesterday we had talked about Hospice, and [name] had expressed a desire for possible more treatment  for his [facility] if his pain could be better managed. Today, he felt that his quality of life was so poor overall, even when his pain is controlled, that Hospice was the best option. His daughter expressed that they could coordinate 24 hour care at his home--using a combination of hired CNA and family--and he would want to be there to spend the rest of his time.   Recommendations/Plan:  DNR, CM consulted to facilitate home Hospice referral  I would recommend ordering an abdominal pleurX placement for ascites  palliation  Pain medication adjusted: Oxycodone increased to 57m q4H scheduled and q4H PRN, start scheduled tylenol   I have also started Zyprexa 2.560mQHS to help with sleep and anxiety  Please discuss MRI results with family; I will start decadron 52m19m6H scheduled  The plan is to improve on his symptom burden in order to stabilize for discharge home with Hospice. He will need the abdominal pleurX placed to manage his re-accumulating ascites. I am working on his pain with titration of opioids and, given the results of the recent MRI, will also start steroids. I would expect steroids to be lifelong, and Hospice can titrate after discharge.   Code Status:  DNR  Prognosis:   < 6 months  Discharge Planning:  Home with Hospice  Care plan was discussed with pt, his daughter [name], and primary team  Thank you for allowing the Palliative Medicine Team to assist in the care of this patient.   Time In: 0915 Time Out: 1005 Total Time 60 minutes Prolonged Time Billed  yes       Greater than 50%  of this time was spent counseling and coordinating care related to the above assessment and plan.  SarCharlynn CourtP Palliative Medicine Team [phone] (7a-5p) Team Phone # [phone]

## 2016-08-10 NOTE — Progress Notes (Signed)
PT Cancellation Note  Patient Details Name: Lee Allen MRN: [id number] DOB: 1924-08-19   Cancelled Treatment:    Reason Eval/Treat Not Completed: Medical issues which prohibited therapy;Patient declined, no reason specified.  Pt in extreme pain which is still not controlled.  Family member states he is trying to sleep and asks to wait until tomorrow as see as able. 08/10/2016  Donnella Sham, [place] [phone]  (pager)   [name] 08/10/2016, 2:33 PM

## 2016-08-10 NOTE — Progress Notes (Signed)
Family Medicine Teaching Service Daily Progress Note Intern Pager: [phone]  Patient name: Lee Allen Medical record number: [id number] Date of birth: May 02, 1925 Age: 82 y.o. Gender: male  Primary Care Provider: [name], PA-C Consultants: IR, palliative Code Status: DNR  Pt Overview and Major Events to Date:  12/18- admitted to [facility] 12/22- paracentesis  12/27- code status changed to DNR  Assessment and Plan: Lee Allen is a 82 y.o. male presenting with abdominal pain. PMH is significant for recent C. Dif colitis, atrial fibrillation with RVR, cirrhosis due to hepatitis C, hepatocellular carcinoma  Left Hip/Back pain. 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 Xray of hip/lumbar/sacral within normal limits. Concern for possible bony mets given pain is not well controlled on tramadol - MRI with fluid along spinal canal possible hematoma, seroma, CSF leak or infected fluid, possibly related to tumor -palliative following, apprecaite recommendations -patient agreeable to home hospice -continue pain control per palliative recs, adding steroids today -lidocaine patch for pain  Hepatocellular Carcinoma- followed by medical oncologist Dr. Shirleen Schirmer at [facility]. Not receiving any chemotherapy currently. Metastasis throughout  - held home cholestyramine due to c. Dif  - palliative following  C. Diff initial recurrence- Improved. Only 1 BM yesterday.  - discontinued vancomycin per ID (12/19 > 12/23) - Continue Dificid for a total of 10 days (12/19 > ), day 9 of 10   Abdominal Pain, improved -  3L obtained via paracentesis off on 12/22 - Will continue to follow with daily exams  - repeat U/S demonstrated reccurrence of ascites, may benefit from pleurx per palliative -c/s placed to IR for pleurx cath placement  A fib with RVR- on diltiazem and eliquis at home. -continue home medications -continue to monitor HR   Anemia baseline  Hgb 10. Hgb 7.4> IV Iron > 7.8>1U RBC > 8.7 >8.6 > 8.5 - FOBT pending  - Continue protonix for GI ppx - monitor CBC  FEN/GI: regular diet Prophylaxis: on eliquis  Disposition: home with hospice care  Subjective:  Lee Allen is feeling very poorly today. He is in pain and tearful. He is asking for his daughters. Unable to sit up or urinate at this time.   Objective: Temp:  [98.3 F (36.8 C)-99.1 F (37.3 C)] 98.4 F (36.9 C) (12/27 6962) Pulse Rate:  [86-98] 98 (12/27 0633) Resp:  [18-20] 18 (12/27 9528) BP: (115-127)/(62-73) 127/73 (12/27 4132) SpO2:  [90 %-96 %] 90 % (12/27 4401) Physical Exam: General: elderly man  in NAD, lying in bed Cardiovascular: RRR, no murmurs  Respiratory: CTA bilaterally no increased work of breathing Abdomen: +BS, distended, soft, non-tender.  Extremities: Warm, well perfused, no edema or cyanosis MSK: L hip not tender to palpation but reduced active ROM, unable to sit up due to pain  Laboratory:  Recent Labs Lab 08/08/16 0503 08/08/16 1633 08/09/16 0618 08/10/16 0629  WBC 3.3*  --  3.9* 4.8  HGB 7.8* 8.7* 8.6* 8.5*  HCT 23.2* 26.4* 26.1* 26.2*  PLT 66*  --  72* 80*    Recent Labs Lab 08/04/16 1049  08/06/16 0405 08/09/16 1312 08/10/16 0629  NA  --   < > 139 138 138  K  --   < > 3.8 3.9 4.0  CL  --   < > 111 107 108  CO2  --   < > 24 23 20*  BUN  --   < > 20 23* 23*  CREATININE  --   < >  0.92 1.15 1.18  CALCIUM  --   < > 8.5* 8.8* 8.8*  PROT 5.3*  --   --   --   --   BILITOT 1.4*  --   --   --   --   ALKPHOS 54  --   --   --   --   ALT 45  --   --   --   --   AST 65*  --   --   --   --   GLUCOSE  --   < > 121* 151* 121*  < > = values in this interval not displayed.  Paracentesis: straw colored, glucose 157, LD 28, total protein < 3,  196 WBC Gram stain WBCs, predominantly mononuclear cells.   Imaging/Diagnostic Tests: US Abdomen Limited  Result Date: 08/10/2016 CLINICAL DATA:  Acute onset of generalized abdominal  distention. Initial encounter. EXAM: LIMITED ABDOMEN ULTRASOUND FOR ASCITES TECHNIQUE: Limited ultrasound survey for ascites was performed in all four abdominal quadrants. COMPARISON:  CT of the abdomen and pelvis performed 07/15/2016 FINDINGS: Moderate ascites is noted within the abdomen and pelvis. IMPRESSION: Moderate abdominopelvic ascites noted. Electronically Signed   By: Garald Balding M.D.   On: 08/10/2016 00:29    Steve Rattler, DO 08/10/2016, 8:15 AM PGY-1, [place] Intern pager: [phone], text pages welcome

## 2016-08-10 NOTE — Progress Notes (Signed)
Occupational Therapy Evaluation Patient Details Name: Lee Allen MRN: [id number] DOB: [birth year] Today's Date: 08/10/2016    History of Present Illness Lee Allen is a 82 y.o. male presenting with abdominal pain, distention and malaise. PMH is significant for recent C. Dif colitis, a. Fib with RVR, cirrhosis due to hepatitis C, hepatocellular carcinoma. Readmitted after 1 week of being home with presenting with abdominal pain. PMH is significant for recent C. Dif colitis.   Clinical Impression   Pt readmitted after @ 1 week of being home. Pt states he was completing his won self care and ambulating using a RW. States this has become more difficult to complete over the last week and that his daughters help  Him out with the care of other tasks. Pt only able to sit EOB for a brief minute before having to recline back to bed due to pain. Pt crying and upset. Pt asking his daughter for "[name]" to come to the hospital. Nsg notified regarding pain. Pt unable to mobilize today and will need SNF at this level. Recommend palliative care consult. Will follow acutely to maximize independence and facilitate DC to next venue of care. Daughter present for session and visually upset regarding her father's decline in function.     Follow Up Recommendations  SNF;Supervision/Assistance - 24 hour    Equipment Recommendations  None recommended by OT    Recommendations for Other Services Other (comment) (Palliative)     Precautions / Restrictions Precautions Precautions: Fall Restrictions Weight Bearing Restrictions: No      Mobility Bed Mobility    Max A to complete supine to sit. Only able to tolerate a few seconds before crying out in pain and needing to recline. Pain improved once reclined              Transfers                 General transfer comment: unable to tolerate    Balance     Sitting balance-Leahy Scale: Poor                                      ADL Overall ADL's : Needs assistance/impaired         Upper Body Bathing: Set up;Bed level   Lower Body Bathing: Maximal assistance;Bed level   Upper Body Dressing : Moderate assistance   Lower Body Dressing: Maximal assistance;Bed level               Functional mobility during ADLs:  (only able to tolerate bed mobility)       Vision     Perception     Praxis      Pertinent Vitals/Pain Pain Assessment: Faces Faces Pain Scale: Hurts worst Pain Location: stomach/R side Pain Descriptors / Indicators: Grimacing;Guarding;Moaning;Crying Pain Intervention(s): Limited activity within patient's tolerance;Repositioned     Hand Dominance Right   Extremity/Trunk Assessment Upper Extremity Assessment Upper Extremity Assessment: Generalized weakness   Lower Extremity Assessment Lower Extremity Assessment: Defer to PT evaluation (RLE stronger than L)   Cervical / Trunk Assessment Cervical / Trunk Assessment: Other exceptions (recent laminectomy)   Communication Communication Communication: No difficulties   Cognition Arousal/Alertness: Awake/alert Behavior During Therapy:  (tearful) Overall Cognitive Status: Impaired/Different from baseline Area of Impairment: Orientation Orientation Level: Time             General Comments: thought today was [birth year]   General Comments       Exercises       Shoulder Instructions      Home Living Family/patient expects to be discharged to:: Unsure                                        Prior Functioning/Environment Level of Independence: Independent        Comments: He has had a recent decline in his ability to care for himself over the last week.Daughters help with housework        OT Problem List: Decreased strength;Decreased activity tolerance;Impaired balance (sitting and/or standing);Decreased knowledge of use of DME or AE;Pain   OT Treatment/Interventions: Self-care/ADL  training;Therapeutic exercise;Energy conservation;DME and/or AE instruction;Therapeutic activities;Cognitive remediation/compensation;Patient/family education;Balance training    OT Goals(Current goals can be found in the care plan section) Acute Rehab OT Goals Patient Stated Goal: to not be in pain OT Goal Formulation: With patient/family Time For Goal Achievement: 08/26/15 Potential to Achieve Goals: Fair  OT Frequency: Min 2X/week   Barriers to D/C:            Co-evaluation              End of Session Nurse Communication: Mobility status;Patient requests pain meds  Activity Tolerance: Patient limited by pain Patient left: in bed;with call bell/phone within reach;with bed alarm set;with family/visitor present   Time: 9471-2527 OT Time Calculation (min): 21 min Charges:  OT General Charges $OT Visit: 1 Procedure OT Evaluation $OT Eval Moderate Complexity: 1 Procedure G-Codes:    Emmalee Solivan,[place] 09-04-2016, 9:16 AM   Maurie Boettcher, OT/L  [phone] 2016-09-04

## 2016-08-10 NOTE — Care Management Note (Addendum)
Case Management Note  Patient Details  Name: Lee Allen MRN: [id number] Date of Birth: 1925-01-08  Subjective/Objective:           CM following for progression and d/c planning         Action/Plan: 08/10/2016 Met with pt and family re home hospice services, the pt lives in [place] , so the family wishes to use [facility] for home services. This CM contacted that agency and all requested info faxed, demographics, H&P, current progress notes, PCP name and family contact, Lee Allen Plan, pt daughter.  4pm Spoke with [facility], who states that they have made a plan with the pt daughter, Lee Allen and plan to begin care at the time of d/c . DME is being delivered. This CM will notified hospice when pt ready to d/c to home after abd drain placed.  Expected Discharge Date:  08/11/2016              Expected Discharge Plan:  Home w Hospice Care  In-House Referral:  NA  Discharge planning Services  CM Consult  Post Acute Care Choice:  Hospice Choice offered to:  Patient  DME Arranged:  N/A DME Agency:  NA  HH Arranged:  RN, Social Work, Nurse's Aide [place] Agency:  [facility]  Status of Service:  Completed, signed off  If discussed at [employer] [facility] [employer], dates discussed:    Additional Comments:  Lee Bene, RN 08/10/2016, 1:34 PM

## 2016-08-10 NOTE — Progress Notes (Signed)
Nutrition Follow-up  DOCUMENTATION CODES:   Not applicable  INTERVENTION:  Provide Ensure Enlive po TID, each supplement provides 350 kcal and 20 grams of protein.  Encourage adequate PO intake.   NUTRITION DIAGNOSIS:   Increased nutrient needs related to chronic illness as evidenced by estimated needs; ongoing  GOAL:   Patient will meet greater than or equal to 90% of their needs; not met  MONITOR:   PO intake, Supplement acceptance, Labs, Weight trends, Skin, I & O's  REASON FOR ASSESSMENT:    (Ordering nutrtional supplements)    ASSESSMENT:   82 y.o. male presenting with abdominal pain. PMH is significant for recent C. Dif colitis, a. Fib with RVR, cirrhosis due to hepatitis C, hepatocellular carcinoma Palliative consulted. Plans to discharge home with hospice with PleurX catheter for comfort.   Meal completion 0% today. Pt in pain and weak today. Pt reports no appetite. Meal completion prior to today has been varied from 50-100%. Pt currently has Ensure ordered and has been consuming them. RD to increase orders to TID to aid in caloric and protein needs. Labs and medications reviewed.   Diet Order:  Diet Heart Room service appropriate? Yes; Fluid consistency: Thin Diet NPO time specified Except for: Sips with Meds  Skin:  Reviewed, no issues  Last BM:  12/25  Height:   Ht Readings from Last 1 Encounters:  08/02/16 _0  (1.727 m)    Weight:   Wt Readings from Last 1 Encounters:  08/08/16 182 lb 5.1 oz (82.7 kg)    Ideal Body Weight:  70 kg  BMI:  Body mass index is 27.72 kg/m.  Estimated Nutritional Needs:   Kcal:  7989-2119  Protein:  90-105 grams  Fluid:  Per MD  EDUCATION NEEDS:   No education needs identified at this time  Corrin Parker, MS, RD, LDN Pager # [phone] After hours/ weekend pager # [phone]

## 2016-08-11 ENCOUNTER — Ambulatory Visit (HOSPITAL_COMMUNITY): Payer: Medicare Other

## 2016-08-11 LAB — CBC
HCT: 25.9 % — ABNORMAL LOW (ref 39.0–52.0)
Hemoglobin: 8.5 g/dL — ABNORMAL LOW (ref 13.0–17.0)
MCH: 31.3 pg (ref 26.0–34.0)
MCHC: 32.8 g/dL (ref 30.0–36.0)
MCV: 95.2 fL (ref 78.0–100.0)
Platelets: 84 10*3/uL — ABNORMAL LOW (ref 150–400)
RBC: 2.72 MIL/uL — AB (ref 4.22–5.81)
RDW: 17.7 % — ABNORMAL HIGH (ref 11.5–15.5)
WBC: 3.5 10*3/uL — AB (ref 4.0–10.5)

## 2016-08-11 MED ORDER — OXYCODONE HCL 5 MG PO TABS
10.0000 mg | ORAL_TABLET | Freq: Four times a day (QID) | ORAL | Status: DC | PRN
  Administered 2016-08-11 (×2): 10 mg via ORAL
  Filled 2016-08-11: qty 2

## 2016-08-11 MED ORDER — DEXAMETHASONE 4 MG PO TABS
4.0000 mg | ORAL_TABLET | Freq: Three times a day (TID) | ORAL | Status: DC
  Administered 2016-08-11 – 2016-08-12 (×3): 4 mg via ORAL
  Filled 2016-08-11 (×3): qty 1

## 2016-08-11 MED ORDER — SENNA 8.6 MG PO TABS
1.0000 | ORAL_TABLET | Freq: Every day | ORAL | Status: DC
  Administered 2016-08-12: 8.6 mg via ORAL
  Filled 2016-08-11: qty 1

## 2016-08-11 MED ORDER — METOCLOPRAMIDE HCL 5 MG PO TABS
5.0000 mg | ORAL_TABLET | Freq: Three times a day (TID) | ORAL | Status: DC | PRN
  Administered 2016-08-11: 5 mg via ORAL
  Filled 2016-08-11: qty 1

## 2016-08-11 MED ORDER — OXYCODONE HCL 5 MG PO TABS
10.0000 mg | ORAL_TABLET | Freq: Four times a day (QID) | ORAL | Status: DC
  Administered 2016-08-11 – 2016-08-12 (×2): 10 mg via ORAL
  Filled 2016-08-11 (×4): qty 2

## 2016-08-11 MED ORDER — POLYETHYLENE GLYCOL 3350 17 G PO PACK
17.0000 g | PACK | Freq: Every day | ORAL | Status: DC
  Administered 2016-08-12: 17 g via ORAL
  Filled 2016-08-11: qty 1

## 2016-08-11 NOTE — Progress Notes (Signed)
Daily Progress Note   Patient Name: Lee Allen       Date: 08/11/2016 DOB: December 02, 1924  Age: 82 y.o. MRN#: [id number] Attending Physician: Alveda Reasons, MD Primary Care Physician: [name], PA-C Admit Date: 08/01/2016  Reason for Consultation/Follow-up: Pain control and Psychosocial/spiritual support  Subjective: Lee Allen appears more comfortable today and he reportedly ate the majority of his breakfast. He feels his pain is well controlled and, in fact, denies pain entirely with movement today. Despite answering all orientation questions appropriately, de does appear to have some slight confusion. His daughter reports he forgot her name this morning, and he was rambling about fires when she asked about how he slept. Otherwise, he is comfortable in bed. No diarrhea this morning.   Length of Stay: 9  Current Medications: Scheduled Meds:  . acetaminophen  650 mg Oral Q6H  . calcitonin (salmon)  1 spray Alternating Nares Daily  . dexamethasone  4 mg Oral TID  . diltiazem  360 mg Oral Daily  . feeding supplement (ENSURE ENLIVE)  237 mL Oral TID BM  . fidaxomicin  200 mg Oral BID  . lactobacillus acidophilus  2 tablet Oral TID  . lidocaine  1 patch Transdermal Q24H  . OLANZapine  2.5 mg Oral QHS  . oxyCODONE  10 mg Oral Q6H  . pantoprazole  40 mg Oral Daily  . [START ON 08/12/2016] vancomycin  1,000 mg Intravenous to XRAY    Continuous Infusions:   PRN Meds: morphine injection, oxyCODONE  Physical Exam        Constitutional: He is oriented to person, place, and time.  Frail and sickly appearing man in bed. He does appear comfortable, though has a blank stare. HENT:  Head: Normocephalic and atraumatic.  Mouth/Throat: Mucous membranes are moist.   Eyes: EOM are normal.  Neck: Normal range of motion.  Cardiovascular:  Normal rate.   Pulmonary/Chest: No accessory muscle usage. Tachypnea noted. No respiratory distress. Increased WOB with prolonged conversation.  Abdominal: He exhibits distension. There is no tenderness.  Musculoskeletal: Normal range of motion.  Neurological: He is alert and oriented to person, place, and time.  Skin: Skin is warm and dry. There is pallor.  Psychiatric: Calm and relaxed.   Vital Signs: BP 119/74 (BP Location: Right Arm)   Pulse 84   Temp 97.6 F (36.4 C) (Oral)   Resp (!) 8 Comment: assessed for 1 full minute  Ht '5\' 8"'$  (1.727 m)   Wt 83.1 kg (183 lb 3.2 oz)   SpO2 95%   BMI 27.86 kg/m  SpO2: SpO2: 95 % O2 Device: O2 Device: Not Delivered O2 Flow Rate:    Intake/output summary:   Intake/Output Summary (Last 24 hours) at 08/11/16 0955 Last data filed at 08/11/16 0622  Gross per 24 hour  Intake              510 ml  Output              225 ml  Net              285 ml   LBM: Last BM Date: 08/08/16 Baseline Weight: Weight: 79.6 kg (175 lb 8 oz) Most recent  weight: Weight: 83.1 kg (183 lb 3.2 oz)       Palliative Assessment/Data: PPS 40%    Patient Active Problem List   Diagnosis Date Noted  . 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 Malignant neoplasm of lung ([place])   . Goals of care, counseling/discussion   . Acute low back pain without sciatica   . Hip pain, acute, left   . Inanition ([place])   . Neoplastic malignant related fatigue   . Generalized abdominal pain 08/02/2016  . Abdominal discomfort   . Diarrhea of presumed infectious origin   . Malignant ascites 08/01/2016  . Elevated troponin   . Dyspnea   . Mitral valve insufficiency   . Acute kidney injury ([place])   . Dehydration   . Hepatocellular carcinoma ([place])   . Enteritis due to Clostridium difficile   . Atrial fibrillation with RVR ([place]) 07/15/2016  . A-fib ([place]) 07/15/2016  . Blunt abdominal trauma 11/18/2013  . Thrombocytopenia ([place])  11/18/2013  . Atrial fibrillation ([place]) 11/18/2013  . Anticoagulated on Coumadin 11/18/2013  . Hypovolemic shock ([place]) 11/18/2013  . Acute blood loss anemia 11/17/2013  . Pelvic fracture ([place]) 11/15/2013  . Cirrhosis of liver due to hepatitis C 10/05/2012  . Arrhythmia 12/23/2010    Palliative Care Assessment & Plan   HPI: 82 y.o. male  with past medical history of cirrhosis due to hepatitis C, metastatic hepatocellular carcinoma (followed by Dr. Altamease Oiler at [facility]), A. Fib with RVR, and recent C. Diff who was admitted on 08/01/2016 with abdominal pain. He is s/p paracentesis on 12/22 with 3L removed. Repeat abd. US showed moderate ascites four days after tap. MRI Lumbar spine with residual tumor without cord compression, but with edema, and unable to exclude osteomyelitis.  Assessment: Since starting scheduled pain medication and steroids on 12/28 Lee Allen appears much more comfortable. He was able to move in his bed without pain, and consumed the majority of his breakfast. He does appear to have slightly too much pain medication board, given his slight confusion and intermittent fixed stare. I talked with him and his daughter, [name], about adjusting his pain medication to find the right balance of adequate pain relief and less side effects. They were both very amendable to this.   The plan is to obtain the abdominal pleurx catheter tomorrow and then discharge home with Hospice. I will continue to adjust his pain medication today, but he is now adequately controlled and stable for discharge home. Hospice can then further adjust his medications as necessary after discharge.   Recommendations/Plan:  DNR, plan for PleurX placement tomorrow then discharge home with Hospice  Pain medication adjusted: will continue Oxycodone '10mg'$  scheduled and PRN, but adjust the timing to q6H. Will also continue scheduled tylenol and continue Zyprexa 2.'5mg'$  QHS   I have also adjusted his decadron dosing to be '4mg'$  TID,  which will continue after discharge and can be titrated by Hospice  Code Status:  DNR  Prognosis:   < 6 months  Discharge Planning:  Home with Hospice  Care plan was discussed with pt and his daughter [name]  Thank you for allowing the Palliative Medicine Team to assist in the care of this patient.   Time In: 0945 Time Out: 1015 Total Time 30 minutes Prolonged Time Billed  no      Greater than 50%  of this time was spent counseling and coordinating care related to the above assessment and plan.  Seaira Byus, NP Palliative Medicine Team [phone] pager (7a-5p) Team Phone # [phone]  

## 2016-08-11 NOTE — Progress Notes (Signed)
Family Medicine Teaching Service Daily Progress Note Intern Pager: [phone]  Patient name: Lee Allen Medical record number: [id number] Date of birth: Feb 01, 1925 Age: 82 y.o. Gender: male  Primary Care Provider: [name], PA-C Consultants: IR, palliative Code Status: DNR  Pt Overview and Major Events to Date:  12/18- admitted to [facility] 12/22- paracentesis  12/27- code status changed to DNR  Assessment and Plan: Lee Allen is a 82 y.o. male presenting with abdominal pain. PMH is significant for recent C. Dif colitis, atrial fibrillation with RVR, cirrhosis due to hepatitis C, hepatocellular carcinoma  Left Hip/Back pain. 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 Xray of hip/lumbar/sacral within normal limits. MRI with fluid along spinal canal possible hematoma, seroma, CSF leak or infected fluid, possibly related to tumor -palliative following, appreciate their care and recommendations -continue pain control per palliative -decadron '4mg'$  q6h -lidocaine patch for pain  Hepatocellular Carcinoma- followed by medical oncologist Dr. Shirleen Schirmer at [facility]. Not receiving any chemotherapy currently. Metastasis throughout  - held home cholestyramine due to c. Dif  - palliative following  C. Diff initial recurrence- Improved.  - discontinued vancomycin per ID (12/19 > 12/23) - Continue Dificid for a total of 10 days (12/19 > 12/28), day 10 of 10   Abdominal Pain, improved -  3L obtained via paracentesis off on 12/22 -c/s placed to IR for pleurx cath placement, plan to place cath 12/29 -eliquis held -NPO midnight tonight  A fib with RVR- on diltiazem and eliquis at home. HR 80's overnight. -continue home medications -continue to monitor HR   Anemia baseline Hgb 10. Hgb 7.4> IV Iron > 7.8>1U RBC > 8.7 >8.6 > 8.5 > 8.5 - FOBT pending  - Continue protonix for GI ppx - monitor CBC  FEN/GI: regular diet Prophylaxis: on  eliquis  Disposition: home with hospice care  Subjective:  Lee Allen is feeling much better today. He hopes to go home soon. Denies any pain currently.   Objective: Temp:  [97.6 F (36.4 C)-97.9 F (36.6 C)] 97.6 F (36.4 C) (12/28 0545) Pulse Rate:  [82-93] 84 (12/28 0545) Resp:  [8-16] 8 (12/28 1478) BP: (103-130)/(69-82) 119/74 (12/28 0545) SpO2:  [94 %-97 %] 95 % (12/28 0545) Weight:  [183 lb 3.2 oz (83.1 kg)] 183 lb 3.2 oz (83.1 kg) (12/27 2146) Physical Exam: General: elderly man  in NAD, lying in bed Cardiovascular: RRR, no murmurs  Respiratory: CTA bilaterally no increased work of breathing Abdomen: +BS, distended, soft, non-tender.  Extremities: Warm, well perfused, no edema or cyanosis MSK: L hip not tender to palpation but reduced active ROM, unable to sit up due to pain  Laboratory:  Recent Labs Lab 08/08/16 0503 08/08/16 1633 08/09/16 0618 08/10/16 0629  WBC 3.3*  --  3.9* 4.8  HGB 7.8* 8.7* 8.6* 8.5*  HCT 23.2* 26.4* 26.1* 26.2*  PLT 66*  --  72* 80*    Recent Labs Lab 08/04/16 1049  08/06/16 0405 08/09/16 1312 08/10/16 0629  NA  --   < > 139 138 138  K  --   < > 3.8 3.9 4.0  CL  --   < > 111 107 108  CO2  --   < > 24 23 20*  BUN  --   < > 20 23* 23*  CREATININE  --   < > 0.92 1.15 1.18  CALCIUM  --   < > 8.5* 8.8* 8.8*  PROT 5.3*  --   --   --   --  BILITOT 1.4*  --   --   --   --   ALKPHOS 54  --   --   --   --   ALT 45  --   --   --   --   AST 65*  --   --   --   --   GLUCOSE  --   < > 121* 151* 121*  < > = values in this interval not displayed.  Paracentesis: straw colored, glucose 157, LD 28, total protein < 3,  196 WBC Gram stain WBCs, predominantly mononuclear cells.   Imaging/Diagnostic Tests: Mr Lumbar Spine W Wo Contrast  Result Date: 08/10/2016 CLINICAL DATA:  Metastatic carcinoma. Recent decompressive surgery in November. Persistent back pain. EXAM: MRI LUMBAR SPINE WITHOUT AND WITH CONTRAST TECHNIQUE: Multiplanar and  multiecho pulse sequences of the lumbar spine were obtained without and with intravenous contrast. CONTRAST:  17 cc MultiHance COMPARISON:  Lumbar MRI 08/13/2015.  CT abdomen pelvis 07/15/2016. FINDINGS: Overall image analysis shows image degradation presumably secondary to the large amount of ascites. Tissue T1 hyperintensity is present before gadolinium was administered. I do not have a definite explanation for that. Perhaps there was imaging done elsewhere with gadolinium administration. The pre and postcontrast axial T1 images appear essentially the same. Segmentation:  5 lumbar type vertebral bodies. Alignment:  Curvature convex to the right with the apex at L3. Vertebrae: Previous posterior decompression at L3 and the debulking of previously seen left-sided predominant tumor within the L3 vertebral body. There is residual tumor within the right posterior vertebral body encroaching upon the right epidural space. This region of tumor measures about 2.6 cm in diameter. Because of the posterior decompression, this does not compress the thecal sac severely. Tumor on the right could affect the right L3 nerve root. There is edema and enhancement throughout the L3 vertebral body that is presumed to relate to edema secondary to the tumor and recent tumor debulking surgery. Osteomyelitis of the L3 vertebral body is not excluded by imaging but I do not strongly suspect that. Changes at the L2-3 disc level are most consistent with discogenic change. I do not see any other foci of tumor within the region imaged. There is a fairly large amount of fluid along the left side of the thecal sac and tracking backward along the operative approach at L3. This could be postoperative hematoma/seroma. The possibility of CSF leak does exist. This fluid could be sterile or infected. It could be easily sampled because of its proximity to the skin at the incision site. Conus medullaris: Extends to the T12-L1 level and appears normal.  Paraspinal and other soft tissues: See above discussion regarding fluid collection along the operative approach to the L3 decompression. Differential diagnosis is hematoma seroma versus CSF leak versus infected fluid. Disc levels: T12-L1 and L1-2: Unremarkable. L2-3: Degenerative disc disease worse on the left the disc space narrowing and left-sided discogenic changes. Mild stenosis of the left lateral recess because of osteophytes and bulging of the disc. L3-4: Disc level itself appears unremarkable. See above for discussion of residual tumor centrally and to the right at the posterior L3 vertebral body and fluid along the left side of the thecal sac and along the operative approach. L4-5: Bulging of the disc. Mild facet and ligamentous hypertrophy. Mild stenosis of the lateral recesses without definite neural compression. L5-S1: Disc degeneration with endplate osteophytes and bulging of the disc. Right-sided annular fissures. Bilateral facet osteoarthritis. Mild bilateral foraminal stenosis. IMPRESSION: Precontrast  T1 tissue hyperintensity of uncertain etiology. See above discussion. Recent posterior decompression at L3 with tumor resection on the left. Residual tumor in the posterior L3 vertebral body centrally and to the right with extension into the ventral epidural space on the right which could affect the right-sided nerve roots. Thecal sac is not severely compressed. No other location of tumor is identified. Fluid within the spinal canal along the left side of the thecal sac at the L3 level, tracking backward along the operative approach to the skin. This could be hematoma/ seroma, CSF leak or infected fluid. Edema and enhancement throughout the L3 vertebral body presumed to be secondary to a combination of tumor, tumor related edema and postsurgical edema. The possibility of osteomyelitis is not excluded but not certain. Electronically Signed   By: Nelson Chimes M.D.   On: 08/10/2016 08:54    Steve Rattler, DO 08/11/2016, 7:20 AM PGY-1, [place] Intern pager: [phone], text pages welcome

## 2016-08-11 NOTE — Progress Notes (Signed)
Pt. Complaining of hiccups. Per patients daughter, the dexamethasone has given him hiccups in the past. MD notified. Order for reglan to be given. Orders followed. Will continue to monitor.

## 2016-08-11 NOTE — Progress Notes (Signed)
Physical Therapy Treatment Patient Details Name: STEPHENS SHREVE MRN: [id number] DOB: Nov 13, 1924 Today's Date: 08/11/2016    History of Present Illness Lee Allen is a 82 y.o. male presenting with abdominal pain, distention and malaise. PMH is significant for recent C. Dif colitis, a. Fib with RVR, cirrhosis due to hepatitis C, hepatocellular carcinoma. Readmitted after 1 week of being home with presenting with abdominal pain. PMH is significant for recent C. Dif colitis.    PT Comments    Pt able to participate today and was assisted with transfers to/from toilet and with ambulation using RW and 2 person assist.  Pt quickly fatigued as expected as he has been unable to participate in therapy lately due to pain.  The plan is now to go home with hospice services.   Follow Up Recommendations  Other (comment) (likely managed by hospice services now.)     Equipment Recommendations  None recommended by PT;Other (comment) (provided by hospice)    Recommendations for Other Services       Precautions / Restrictions Precautions Precautions: Fall    Mobility  Bed Mobility Overal bed mobility: Needs Assistance Bed Mobility: Sit to Supine       Sit to supine: Mod assist;+2 for safety/equipment   General bed mobility comments: assist to pull his legs in bed  Transfers Overall transfer level: Needs assistance Equipment used: 1 person hand held assist Transfers: Sit to/from Stand;Stand Pivot Transfers Sit to Stand: Mod assist Stand pivot transfers: Mod assist;+2 safety/equipment       General transfer comment: cues for guiding and stability  Ambulation/Gait Ambulation/Gait assistance: Mod assist Ambulation Distance (Feet): 40 Feet Assistive device: Rolling walker (2 wheeled) Gait Pattern/deviations: Step-through pattern;Decreased stride length;Trunk flexed   Gait velocity interpretation: Below normal speed for age/gender General Gait Details: weak-kneed, unsteady gait  with RW and 2 person assist for safety.  Needed assist maneuvering the RW frequently   Stairs            Wheelchair Mobility    Modified Rankin (Stroke Patients Only)       Balance Overall balance assessment: Needs assistance   Sitting balance-Leahy Scale: Fair     Standing balance support: Single extremity supported;Bilateral upper extremity supported Standing balance-Leahy Scale: Poor Standing balance comment: reliant on external support                    Cognition Arousal/Alertness: Awake/alert Behavior During Therapy: WFL for tasks assessed/performed Overall Cognitive Status: Impaired/Different from baseline                      Exercises      General Comments        Pertinent Vitals/Pain Pain Assessment: Faces Faces Pain Scale: Hurts little more Pain Location: stomach/R side Pain Descriptors / Indicators: Grimacing;Guarding Pain Intervention(s): Monitored during session;Repositioned    Home Living                      Prior Function            PT Goals (current goals can now be found in the care plan section) Acute Rehab PT Goals Patient Stated Goal: to not be in pain PT Goal Formulation: With patient Time For Goal Achievement: 08/12/16 Potential to Achieve Goals: Fair Progress towards PT goals: Progressing toward goals    Frequency    Min 3X/week      PT Plan Current plan remains appropriate    Co-evaluation  End of Session   Activity Tolerance: Patient limited by fatigue Patient left: in bed;with call bell/phone within reach;with bed alarm set     Time: 8341-9622 PT Time Calculation (min) (ACUTE ONLY): 19 min  Charges:  $Therapeutic Activity: 8-22 mins                    G CodesTessie Fass Sabeen Piechocki 08/11/2016, 2:42 PM  08/11/2016  Donnella Sham, PT [phone]  (pager)

## 2016-08-12 ENCOUNTER — Encounter (HOSPITAL_COMMUNITY): Payer: Self-pay | Admitting: Interventional Radiology

## 2016-08-12 ENCOUNTER — Inpatient Hospital Stay (HOSPITAL_COMMUNITY): Payer: Medicare Other

## 2016-08-12 DIAGNOSIS — Z515 Encounter for palliative care: Secondary | ICD-10-CM

## 2016-08-12 DIAGNOSIS — R140 Abdominal distension (gaseous): Secondary | ICD-10-CM

## 2016-08-12 HISTORY — PX: IR GENERIC HISTORICAL: IMG1180011

## 2016-08-12 LAB — CBC
HEMATOCRIT: 27.7 % — AB (ref 39.0–52.0)
Hemoglobin: 8.9 g/dL — ABNORMAL LOW (ref 13.0–17.0)
MCH: 30.9 pg (ref 26.0–34.0)
MCHC: 32.1 g/dL (ref 30.0–36.0)
MCV: 96.2 fL (ref 78.0–100.0)
Platelets: 116 10*3/uL — ABNORMAL LOW (ref 150–400)
RBC: 2.88 MIL/uL — ABNORMAL LOW (ref 4.22–5.81)
RDW: 17.6 % — AB (ref 11.5–15.5)
WBC: 10.8 10*3/uL — AB (ref 4.0–10.5)

## 2016-08-12 MED ORDER — CALCITONIN (SALMON) 200 UNIT/ACT NA SOLN: 1.0000 | Freq: Every day | NASAL | 12 refills | Status: AC

## 2016-08-12 MED ORDER — MIDAZOLAM HCL 2 MG/2ML IJ SOLN
INTRAMUSCULAR | Status: AC | PRN
  Administered 2016-08-12: 0.5 mg via INTRAVENOUS

## 2016-08-12 MED ORDER — SODIUM CHLORIDE 0.9 % IV SOLN
INTRAVENOUS | Status: AC | PRN
  Administered 2016-08-12: 50 mL/h via INTRAVENOUS

## 2016-08-12 MED ORDER — FENTANYL CITRATE (PF) 100 MCG/2ML IJ SOLN
INTRAMUSCULAR | Status: AC | PRN
  Administered 2016-08-12: 12.5 ug via INTRAVENOUS

## 2016-08-12 MED ORDER — POLYETHYLENE GLYCOL 3350 17 G PO PACK: 17.0000 g | PACK | Freq: Every day | ORAL | 0 refills | Status: AC

## 2016-08-12 MED ORDER — OXYCODONE HCL 5 MG PO TABS: 5.0000 mg | ORAL_TABLET | Freq: Four times a day (QID) | ORAL | Status: DC | PRN

## 2016-08-12 MED ORDER — METOCLOPRAMIDE HCL 5 MG PO TABS: 5.0000 mg | ORAL_TABLET | Freq: Three times a day (TID) | ORAL | 0 refills | Status: AC | PRN

## 2016-08-12 MED ORDER — VANCOMYCIN HCL IN DEXTROSE 1-5 GM/200ML-% IV SOLN
INTRAVENOUS | Status: AC
  Filled 2016-08-12: qty 200

## 2016-08-12 MED ORDER — MORPHINE SULFATE (PF) 2 MG/ML IV SOLN: 2.0000 mg | INTRAVENOUS | 0 refills | Status: AC | PRN

## 2016-08-12 MED ORDER — PANTOPRAZOLE SODIUM 40 MG PO TBEC: 40.0000 mg | DELAYED_RELEASE_TABLET | Freq: Every day | ORAL | 0 refills | Status: AC

## 2016-08-12 MED ORDER — LIDOCAINE 5 % EX PTCH: 1.0000 | MEDICATED_PATCH | CUTANEOUS | 0 refills | Status: AC

## 2016-08-12 MED ORDER — SENNA 8.6 MG PO TABS: 1.0000 | ORAL_TABLET | Freq: Every day | ORAL | 0 refills | Status: AC

## 2016-08-12 MED ORDER — MIDAZOLAM HCL 2 MG/2ML IJ SOLN
INTRAMUSCULAR | Status: AC
  Filled 2016-08-12: qty 2

## 2016-08-12 MED ORDER — BACID PO TABS: 2.0000 | ORAL_TABLET | Freq: Three times a day (TID) | ORAL | 0 refills | Status: AC

## 2016-08-12 MED ORDER — OXYCODONE HCL 5 MG PO TABS
5.0000 mg | ORAL_TABLET | Freq: Four times a day (QID) | ORAL | Status: DC
  Filled 2016-08-12: qty 1

## 2016-08-12 MED ORDER — DEXAMETHASONE 4 MG PO TABS: 4.0000 mg | ORAL_TABLET | Freq: Three times a day (TID) | ORAL | 0 refills | Status: AC

## 2016-08-12 MED ORDER — OXYCODONE HCL 5 MG PO TABS: 5.0000 mg | ORAL_TABLET | Freq: Four times a day (QID) | ORAL | 0 refills | Status: AC

## 2016-08-12 MED ORDER — FENTANYL CITRATE (PF) 100 MCG/2ML IJ SOLN
INTRAMUSCULAR | Status: AC
  Filled 2016-08-12: qty 2

## 2016-08-12 NOTE — Sedation Documentation (Addendum)
Pt very somnolent unless disturbed, Resp 6-10.  O2 sat 94% on 2 L/[place].  MD advised.

## 2016-08-12 NOTE — Sedation Documentation (Addendum)
No peritoneal fluid found by [place], unable to insert catheter today. Floor RN informed

## 2016-08-12 NOTE — Progress Notes (Signed)
Family Medicine Teaching Service Daily Progress Note Intern Pager: [phone]  Patient name: Lee Allen Medical record number: [id number] Date of birth: 1924/10/11 Age: 82 y.o. Gender: male  Primary Care Provider: [name], PA-C Consultants: IR, palliative Code Status: DNR  Pt Overview and Major Events to Date:  12/18- admitted to [facility] 12/22- paracentesis  12/27- code status changed to DNR 12/28- pleurx cath placed by IR  Assessment and Plan: Lee Allen is a 82 y.o. male presenting with abdominal pain. PMH is significant for recent C. 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 Xray of hip/lumbar/sacral within normal limits. MRI with fluid along spinal canal possible hematoma, seroma, CSF leak or infected fluid, possibly related to tumor -palliative following, appreciate their care and recommendations -continue pain control per palliative -decadron '4mg'$  q6h -lidocaine patch for pain  Abdominal Pain, improved -  3L obtained via paracentesis off on 12/22 -c/s placed to IR for pleurx cath placement, plan to place cath 12/29 -eliquis held, can restart after cath is placed  Hepatocellular Carcinoma- followed by medical oncologist Dr. Shirleen Schirmer at [facility]. Not receiving any chemotherapy currently. Metastasis throughout  - held home cholestyramine due to c. Dif  - palliative following  C. Diff initial recurrence- Improved.  - discontinued vancomycin per ID (12/19 > 12/23) - completed course of Dificid for a total of 10 days (12/19 > 12/28)   A fib with RVR- stable- on diltiazem and eliquis at home. HR 80's overnight. -continue home medications -continue to monitor HR   Anemia- stable: baseline Hgb 10. Hgb 7.4> IV Iron > 7.8>1U RBC > 8.7 >8.6 > 8.5 > 8.5 > 8.5 - FOBT pending  - Continue protonix for GI ppx -  monitor CBC  FEN/GI: regular diet Prophylaxis: on eliquis  Disposition: home with hospice care  Subjective:  Lee Allen is confused and agitated this morning. He tried to get out of bed to go home this morning and was found sitting on the floor. No signs of trauma or bruising. Small right arm abrasion. He is denying pain. He is asking to see his daughters and is upset.   Objective: Temp:  [97.6 F (36.4 C)-98.9 F (37.2 C)] 97.6 F (36.4 C) (12/29 0928) Pulse Rate:  [71-102] 81 (12/29 0928) Resp:  [16-20] 16 (12/29 0928) BP: (113-148)/(57-115) 121/57 (12/29 0928) SpO2:  [94 %-99 %] 99 % (12/29 0845) Physical Exam: General: elderly man  in NAD, lying in bed Cardiovascular: RRR, no murmurs  Respiratory: CTA bilaterally no increased work of breathing Abdomen: +BS, distended, soft, non-tender.  Extremities: Warm, well perfused, no edema or cyanosis  Laboratory:  Recent Labs Lab 08/10/16 0629 08/11/16 0524 08/12/16 0741  WBC 4.8 3.5* 10.8*  HGB 8.5* 8.5* 8.9*  HCT 26.2* 25.9* 27.7*  PLT 80* 84* 116*    Recent Labs Lab 08/06/16 0405 08/09/16 1312 08/10/16 0629  NA 139 138 138  K 3.8 3.9 4.0  CL 111 107 108  CO2 24 23 20*  BUN 20 23* 23*  CREATININE 0.92 1.15 1.18  CALCIUM 8.5* 8.8* 8.8*  GLUCOSE 121* 151* 121*    Paracentesis: straw colored, glucose 157, LD 28, total protein < 3,  196 WBC Gram stain WBCs, predominantly mononuclear cells.   Imaging/Diagnostic Tests: No results found.  Steve Rattler, DO 08/12/2016, 9:54 AM PGY-1, [place] Intern  pager: [phone], text pages welcome

## 2016-08-12 NOTE — Sedation Documentation (Signed)
Patient is resting comfortably. 

## 2016-08-12 NOTE — Progress Notes (Signed)
Daily Progress Note   Patient Name: Lee Allen       Date: 08/12/2016 DOB: 10/25/1924  Age: 82 y.o. MRN#: [id number] Attending Physician: Alveda Reasons, MD Primary Care Physician: [name], PA-C Admit Date: 08/01/2016  Reason for Consultation/Follow-up: Pain control and Psychosocial/spiritual support  Subjective: [name] had an unwitnessed fall earlier this morning. On my assessment, he is very confused and rambling. When redirected, he was able to tell me where he was and the correct date. He was confused on the the situation. He did remember me from yesterday, and could recall or conversations over the past few days. He denies pain and was able to reposition himself in bed without issue. He also reports sleeping well. He appears calm without evidence of agitation or anxiety.   Length of Stay: 10  Current Medications: Scheduled Meds:  . acetaminophen  650 mg Oral Q6H  . calcitonin (salmon)  1 spray Alternating Nares Daily  . dexamethasone  4 mg Oral TID  . diltiazem  360 mg Oral Daily  . feeding supplement (ENSURE ENLIVE)  237 mL Oral TID BM  . lactobacillus acidophilus  2 tablet Oral TID  . lidocaine  1 patch Transdermal Q24H  . oxyCODONE  5 mg Oral Q6H  . pantoprazole  40 mg Oral Daily  . polyethylene glycol  17 g Oral Daily  . senna  1 tablet Oral Daily  . vancomycin  1,000 mg Intravenous to XRAY    Continuous Infusions:   PRN Meds: metoCLOPramide, morphine injection, oxyCODONE  Physical Exam        Constitutional: He is oriented to person, place, and time.  Frail and sickly appearing man in bed. He does appear comfortable. Confused on situation. Rambling but able to be redirected. HENT:  Head: Normocephalic and atraumatic.  Mouth/Throat: Mucous membranes are dry.   Eyes: EOM are normal.  Neck: Normal  range of motion.  Cardiovascular: Normal rate.   Pulmonary/Chest: No accessory muscle usage. Tachypnea noted. No respiratory distress. Increased WOB with prolonged conversation.  Abdominal: He exhibits distension (now firm). There is no tenderness.  Musculoskeletal: Normal range of motion.  Neurological: He is alert and oriented to person, place, and time.  Skin: Skin is warm and dry. There is pallor.  Psychiatric: Calm and relaxed.   Vital Signs: BP (!) 121/57 (BP Location: Left Arm)   Pulse 81   Temp 97.6 F (36.4 C) (Oral)   Resp 16   Ht '5\' 8"'$  (1.727 m)   Wt 83.1 kg (183 lb 3.2 oz)   SpO2 99%   BMI 27.86 kg/m  SpO2: SpO2: 99 % O2 Device: O2 Device: Not Delivered O2 Flow Rate:    Intake/output summary:   Intake/Output Summary (Last 24 hours) at 08/12/16 1004 Last data filed at 08/12/16 0931  Gross per 24 hour  Intake              600 ml  Output              595 ml  Net                5 ml   LBM: Last BM Date: 08/11/16 Baseline Weight: Weight:  79.6 kg (175 lb 8 oz) Most recent weight: Weight: 83.1 kg (183 lb 3.2 oz)       Palliative Assessment/Data: PPS 40%    Patient Active Problem List   Diagnosis Date Noted  . 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 Malignant neoplasm of lung ([place])   . Goals of care, counseling/discussion   . Acute left-sided low back pain without sciatica   . Hip pain, acute, left   . Inanition ([place])   . Neoplastic malignant related fatigue   . Generalized abdominal pain 08/02/2016  . Abdominal discomfort   . Diarrhea of presumed infectious origin   . Ascites 08/01/2016  . Elevated troponin   . Dyspnea   . Mitral valve insufficiency   . Acute kidney injury ([place])   . Dehydration   . Hepatocellular carcinoma ([place])   . Enteritis due to Clostridium difficile   . Atrial fibrillation with RVR ([place]) 07/15/2016  . A-fib ([place]) 07/15/2016  . Blunt abdominal trauma 11/18/2013  .  Thrombocytopenia ([place]) 11/18/2013  . Atrial fibrillation ([place]) 11/18/2013  . Anticoagulated on Coumadin 11/18/2013  . Hypovolemic shock ([place]) 11/18/2013  . Acute blood loss anemia 11/17/2013  . Pelvic fracture ([place]) 11/15/2013  . Cirrhosis of liver due to hepatitis C 10/05/2012  . Arrhythmia 12/23/2010    Palliative Care Assessment & Plan   HPI: 82 y.o. male  with past medical history of cirrhosis due to hepatitis C, metastatic hepatocellular carcinoma (followed by Dr. Altamease Oiler at [facility]), A. Fib with RVR, and recent C. Diff who was admitted on 08/01/2016 with abdominal pain. He is s/p paracentesis on 12/22 with 3L removed. Repeat abd. US showed moderate ascites four days after tap. MRI Lumbar spine with residual tumor without cord compression, but with edema, and unable to exclude osteomyelitis.  Assessment: Since starting scheduled pain medication and steroids on 12/28 [name] appears much more comfortable. He was able to move in his bed without pain and endorses improved sleep quality. I am still worried that he is receiving too much pain medication, as he continues to have an intermittent fixed stare and ongoing confusion. Both could also be attributable to delirium, which he is at high risk for having. Fortunately, he does appear calm without anxiety or agitation. I discussed these issues with his daughter, [name], who agreed that controlled pain and good sleep were more important than being fully cognizant and suffering. I discussed decreasing the pain medication further, but leaving a PRN order to enable extra doses if needed. She was in support of this plan. I also reiterated the importance of contacting Hospice if anything changes--such as increased confusion, anxiety, agitation, or uncontrolled pain--which she assured she would do.   The plan is to obtain the abdominal pleurx catheter today and then discharge home with Hospice.   Recommendations/Plan:  DNR, plan for abdominal PleurX  placement today then discharge home with Hospice  Care nurse asked to educate [name] on drain management and provide extra canister if needed  Pain medication adjusted: change Oxycodone to '5mg'$  q6H scheduled and PRN. Will also continue scheduled tylenol. I will stop Zyprexa, as it may be contributing to his confusion with no overt benefit from use.   Continue decadron '4mg'$  TID, which will continue after discharge and can be titrated by Hospice  Code Status:  DNR  Prognosis:   < 6 months  Discharge Planning:  Home with Hospice  Care plan was discussed with pt and  his daughter [name]  Thank you for allowing the Palliative Medicine Team to assist in the care of this patient.   Time In: 1000 Time Out: 1015 Total Time 15 minutes Prolonged Time Billed  no      Greater than 50%  of this time was spent counseling and coordinating care related to the above assessment and plan.  Charlynn Court, NP Palliative Medicine Team [phone] pager (7a-5p) Team Phone # [phone]

## 2016-08-12 NOTE — Progress Notes (Signed)
08/12/16 0840  What Happened  Was fall witnessed? No  Was patient injured? No  Patient found on floor  Found by Staff-comment  Stated prior activity ambulating-unassisted  Follow Up  MD notified Lucila Maine, DO  Time MD notified 0900  Family notified Yes-comment  Time family notified 0900  Additional tests No  Progress note created (see row info) Yes  Adult Fall Risk Assessment  Risk Factor Category (scoring not indicated) High fall risk per protocol (document High fall risk)  Age 82  Fall History: Fall within 6 months prior to admission 0  Elimination; Bowel and/or Urine Incontinence 0  Elimination; Bowel and/or Urine Urgency/Frequency 0  Medications: includes PCA/Opiates, Anti-convulsants, Anti-hypertensives, Diuretics, Hypnotics, Laxatives, Sedatives, and Psychotropics 5  Patient Care Equipment 2  Mobility-Assistance 2  Mobility-Gait 2  Mobility-Sensory Deficit 0  Altered awareness of immediate physical environment 0  Impulsiveness 0  Lack of understanding of one's physical/cognitive limitations 0  Total Score 14  Patient's Fall Risk High Fall Risk (>13 points)  Adult Fall Risk Interventions  Required Bundle Interventions *See Row Information* High fall risk - low, moderate, and high requirements implemented  Additional Interventions Camera surveillance (with patient/family notification & education);Individualized elimination schedule  Screening for Fall Injury Risk  Risk For Fall Injury- See Row Information  A;Nurse judgement  Vitals  Temp Source Oral  BP (!) 148/115  BP Location Left Arm  BP Method Automatic  Patient Position (if appropriate) Sitting  Pulse Rate 81  Pulse Rate Source Dinamap  Resp 20  Oxygen Therapy  SpO2 95 %  O2 Device Room Air  Pain Assessment  Pain Assessment No/denies pain  Pain Score 0  Faces Pain Scale 0  PAINAD (Pain Assessment in Advanced Dementia)  Breathing 0  PCA/Epidural/Spinal Assessment  Respiratory Pattern Regular;Unlabored   Neurological  Neuro (WDL) X  Level of Consciousness Alert  Orientation Level Oriented to person;Oriented to place;Oriented to situation;Disoriented to time  Cognition Poor attention/concentration;Poor judgement;Poor safety awareness  Speech Clear  Pupil Assessment  No  Additional Pupil Assessments No  Motor Function/Sensation Assessment Grip  R Hand Grip Present;Moderate  L Hand Grip Present;Moderate   Neuro Symptoms Forgetful;Fatigue  Neuro Additional Assessments No  Glasgow Coma Scale  Eye Opening 4  Best Verbal Response (NON-intubated) 5  Best Motor Response 6  Glasgow Coma Scale Score 15  Musculoskeletal  Musculoskeletal (WDL) X  Assistive Device BSC;Front wheel walker  Generalized Weakness Yes  Weight Bearing Restrictions No  Integumentary  Integumentary (WDL) X  Skin Color Pink  Skin Condition Dry  Skin Integrity Ecchymosis  Ecchymosis Location Hip;Arm  Ecchymosis Location Orientation Bilateral  Skin Tear Location Arm  Skin Tear Location Orientation Right  Skin Turgor Non-tenting

## 2016-08-12 NOTE — Progress Notes (Signed)
08/12/2016 8:53 AM  Staff alerted Leadership of patient fall. Unwitnessed, with skin tear injury. Assisted attending RN Karena Addison to inform MD. Paged Teaching Service. MD called back stating she was coming to bedside to assess the patient.   Margrett Kalb [employer], RN-BC, [employer] Phone [phone]

## 2016-08-12 NOTE — Progress Notes (Signed)
Patient ID: Lee Allen, male   DOB: 09-27-1924, 82 y.o.   MRN: [id number]  IR Note:  Patient brought down for peritoneal tunneled drain placement.  Ultrasound shows essentially no ascites with only trace fluid between bowel loops. Procedure cancelled.  Will need much more fluid to re-accumulate prior to considering drain placement.  Venetia Night. Kathlene Cote, M.D Pager:  [phone]

## 2016-08-12 NOTE — Sedation Documentation (Signed)
Dr Kathlene Cote at bedside.

## 2016-08-12 NOTE — Sedation Documentation (Signed)
O2 2l/[place] added

## 2016-08-16 NOTE — Progress Notes (Deleted)
Cardiology Office Note:    Date:  08/16/2016   ID:  [name], DOB 01-Apr-1925, MRN [id number]  PCP:  [name], PA-C  Cardiologist:  Dr. Dorris Carnes   Electrophysiologist:  n/a  Referring MD: [name], PA-C   No chief complaint on file. ***  History of Present Illness:    Lee Allen is a 83 y.o. male with a hx of HCV, metastatic hepatocellular CA s/p prior ablation, cirrhosis, thrombocytopenia, ascending thoracic aortic aneurysm (4 cm).  He was admitted in 2/14 with AFib/Flutter with RVR.  Chest CT was negative for PE but did show coronary calcification and dilated ascending aorta (4 cm).  He was placed on heparin/coumadin and underwent TEE-DCCV restoring NSR.  TEE 10/04/12:  EF 60-65%, mild to mod MR, mild LAE.  He had recurrent AFlutter in 4/15.  He was asymptomatic and therefore he did not undergo DCCV.  Last seen by Dr. Dorris Carnes in 6/15.    He developed evidence of metastasis to L3 vertebral body with nerve root compression.  He underwent radiation Rx and eventually lumbar laminectomy at [facility] in 06/2016.    He was admitted to [facility] 12/1-12/9 with AF with RVR in the setting of CDiff colitis.  He was tx with oral Vancomycin.  He had an elevated Troponin felt to be related to demand ischemia.  He was not longer on anticoagulation due to [facility] and cirrhosis.  He was followed by Cardiology.  Short term anticoagulation was felt to be acceptable.  He was started on Eliquis and was scheduled for TEE-DCCV, but converted to NSR on his own. Echo did show mod to severe MR.  He is not likely a surgical candidate.  A follow up echocardiogram was to be done prior to his follow up appointment.  His anticoagulation is to be DC'd after 30 days.    He was readmitted 12/18-12/29 with recurrent CDiff colitis and ascites.  He underwent US guided paracentesis with removal of 3.1 L of fluid.  He was seen by Palliative Care and his status was changed to DNR.   ***   Prior CV studies that  were reviewed today include:    Echo 07/17/16 Mild conc LVH, vigorous LVF, EF 65-70, no RWMA, MAC, mod to severe MR, mod to severe BAE, mild to mod TR, PASP 36  TEE 2/14 EF 60-65, mild to mod MR, mild LAE   Past Medical History:  Diagnosis Date  . Ascending aorta dilation (HCC)    4 cm by chest CT 2/14  . Atrial flutter ([place])    09/2012 s/p TEE/DCCV  . C. difficile diarrhea 07/2016  . CAD (coronary artery disease)    coronary calcification by chest CT 09/2012  . Dyspnea   . GERD (gastroesophageal reflux disease)   . [place] (hepatocellular carcinoma) ([place])    s/p ablation, with extension to Abdominal/chest wall  . Hepatitis C   . History of tobacco abuse    quit 1972  . Liver cirrhosis ([place])   . Nephrolithiasis   . Shingles   . Skin cancer     Past Surgical History:  Procedure Laterality Date  . ABLATION  2012   liver  . CARDIOVERSION N/A 10/04/2012   Procedure: CARDIOVERSION;  Surgeon: Peter M Martinique, MD;  Location: [facility] ENDOSCOPY;  Service: Cardiovascular;  Laterality: N/A;  . IR GENERIC HISTORICAL  08/12/2016   IR US GUIDANCE 08/12/2016 Aletta Edouard, MD [facility]-INTERV RAD  . LITHOTRIPSY    . SKIN CANCER EXCISION    .  TEE WITHOUT CARDIOVERSION N/A 10/04/2012   Procedure: TRANSESOPHAGEAL ECHOCARDIOGRAM (TEE);  Surgeon: Peter M Martinique, MD;  Location: [facility] ENDOSCOPY;  Service: Cardiovascular;  Laterality: N/A;    Current Medications: No outpatient prescriptions have been marked as taking for the 08/17/16 encounter (Appointment) with Liliane Shi, PA-C.     Allergies:   Penicillins   Social History   Social History  . Marital status: Married    Spouse name: N/A  . Number of children: N/A  . Years of education: N/A   Social History Main Topics  . Smoking status: Former Smoker    Types: Cigarettes    Quit date: 08/15/1970  . Smokeless tobacco: Never Used  . Alcohol use 1.8 oz/week    3 Cans of beer per week     Comment: occasionally  . Drug use: No  . Sexual activity:  Not on file   Other Topics Concern  . Not on file   Social History Narrative  . No narrative on file     Family History:  The patient's ***family history includes Leukemia in his father.   ROS:   Please see the history of present illness.    ROS All other systems reviewed and are negative.   EKGs/Labs/Other Test Reviewed:    EKG:  EKG is *** ordered today.  The ekg ordered today demonstrates ***  Recent Labs: 07/15/2016: TSH 2.474 07/23/2016: Magnesium 2.0 08/01/2016: B Natriuretic Peptide 39.3 08/04/2016: ALT 45 08/10/2016: BUN 23; Creatinine, Ser 1.18; Potassium 4.0; Sodium 138 08/12/2016: Hemoglobin 8.9; Platelets 116   Recent Lipid Panel No results found for: CHOL, TRIG, HDL, CHOLHDL, VLDL, LDLCALC, LDLDIRECT   Physical Exam:    VS:  There were no vitals taken for this visit.    Wt Readings from Last 3 Encounters:  08/10/16 183 lb 3.2 oz (83.1 kg)  07/23/16 174 lb 12.8 oz (79.3 kg)  06/18/14 165 lb (74.8 kg)     ***Physical Exam  ASSESSMENT:    No diagnosis found. PLAN:    In order of problems listed above:  1. Parox AF - ***  -  DC Apixaban after total Rx x 30 days.*** 2. Mitral Regurgitation - Mod to severe by Echo 12/17.  He is not a surgical candidate.  *** 3. Metastatic Hepatocellular CA - He is now DNR.    Medication Adjustments/Labs and Tests Ordered: Current medicines are reviewed at length with the patient today.  Concerns regarding medicines are outlined above.  Medication changes, Labs and Tests ordered today are outlined in the Patient Instructions noted below. There are no Patient Instructions on file for this visit. Signed, Richardson Dopp, PA-C  08/16/2016 2:24 PM    [facility] [place], [place], [place]  [address] Phone: [phone]; Fax: [phone]

## 2016-08-17 ENCOUNTER — Ambulatory Visit: Payer: Medicare Other | Admitting: Physician Assistant

## 2016-09-15 DEATH — deceased

## 2016-11-29 ENCOUNTER — Telehealth: Payer: Self-pay | Admitting: Internal Medicine

## 2016-11-29 NOTE — Telephone Encounter (Signed)
Patient had no showed for echo - after reading chart patient was placed in  hospice care.  Spoke with [facility] office and they stated patient expired on 2016-09-09.   Medical records was informed.

## 2017-08-09 IMAGING — DX DG ABDOMEN 1V
1 series · 1 of 1 positions shown · non-contrast
Comparison: CT 07/15/2016.

CLINICAL DATA: Abdominal distention.

EXAM:
ABDOMEN - 1 VIEW

[x abdomen supine]
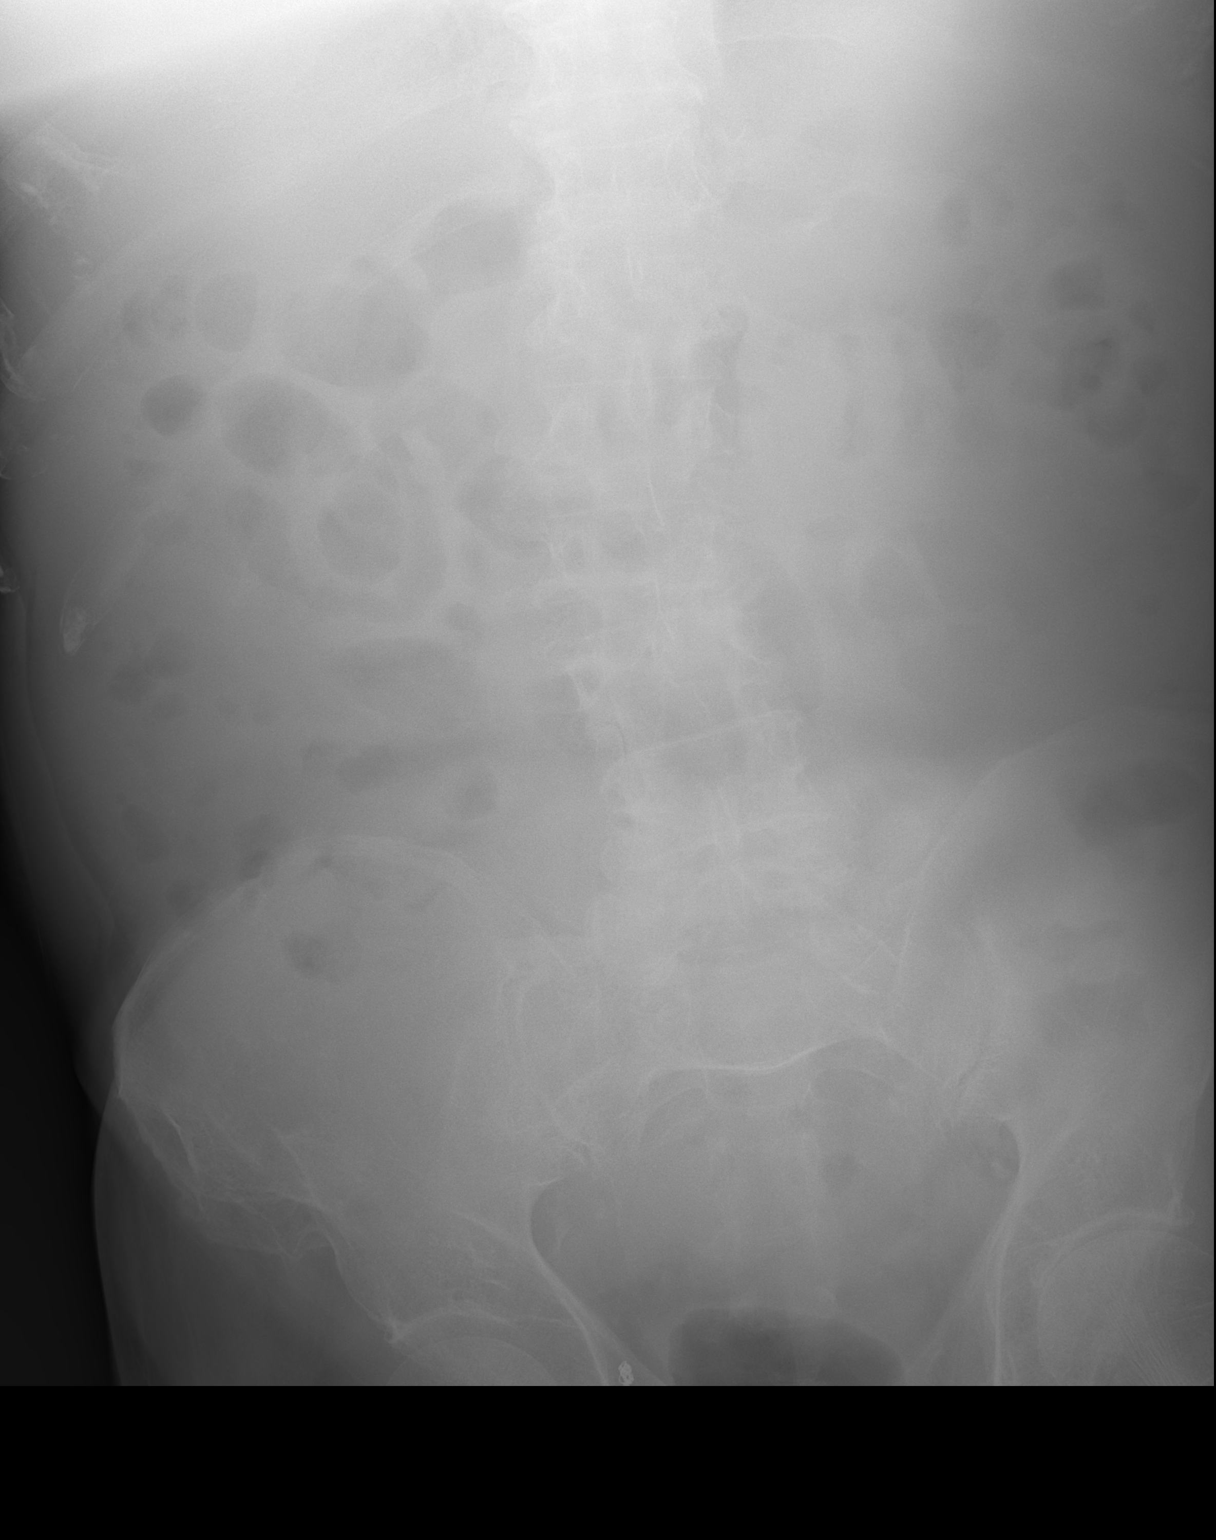

[1 of 1 positions shown; findings below may reference images not displayed]

FINDINGS: Soft tissue structures are unremarkable. No bowel distention. No
free air. Degenerative changes lumbar spine with scoliosis concave
left. Surgical coils noted over the right groin.
IMPRESSION: No acute abnormality identified.

## 2017-08-17 IMAGING — MR MR LUMBAR SPINE WO/W CM
4 of 8 series · 22 of 48 positions shown · IV contrast (20    MULTI)
Comparison: Lumbar MRI 08/13/2015.  CT abdomen pelvis 07/15/2016.

CLINICAL DATA: Metastatic carcinoma. Recent decompressive surgery
in [REDACTED]. Persistent back pain.

EXAM:
MRI LUMBAR SPINE WITHOUT AND WITH CONTRAST
TECHNIQUE: Multiplanar and multiecho pulse sequences of the lumbar spine were
obtained without and with intravenous contrast.
CONTRAST:  17 cc MultiHance

[Series 4: T1 · sagittal · 4.0mm · 0.55mm/px · 3 of 14 slices shown (1 of 2)]
[im 1/14]
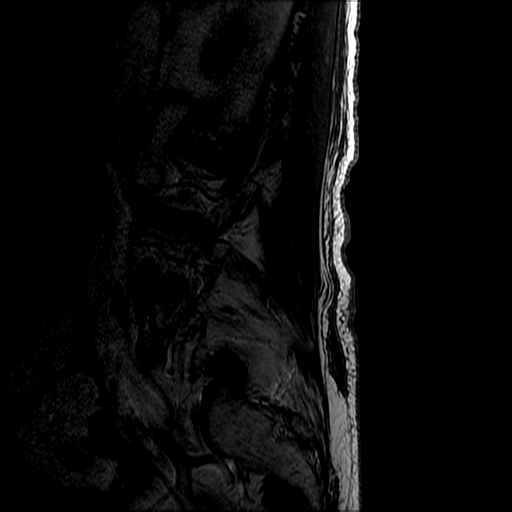
[im 7/14]
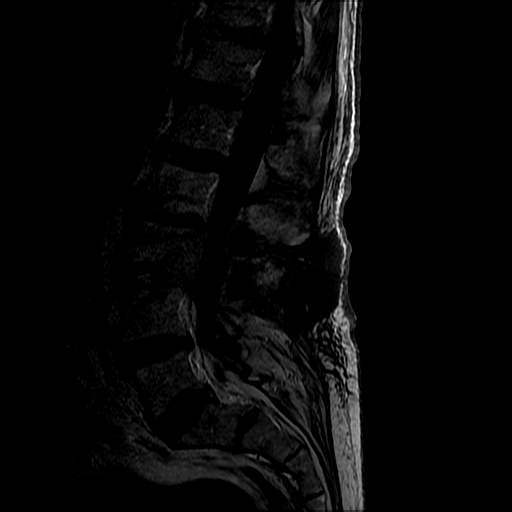
[im 14/14]
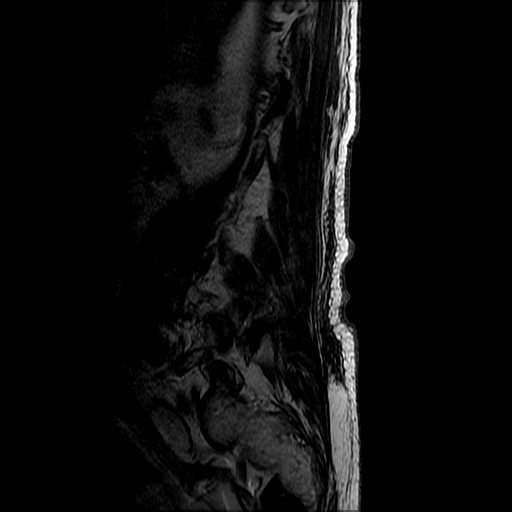

[Series 5: T2 · sagittal · 4.0mm · 0.55mm/px · 4 of 14 slices shown (1 of 2)]
[im 1/14]
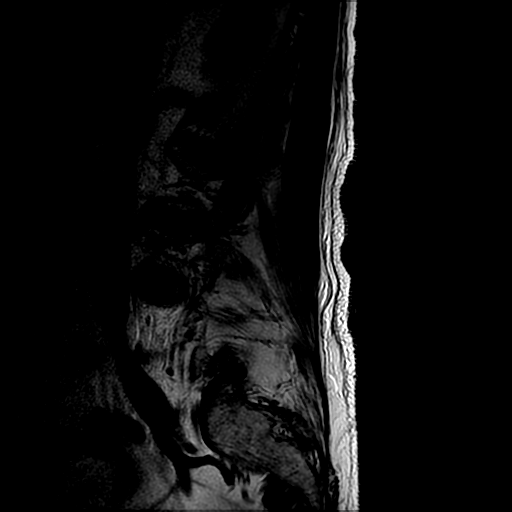
[im 5/14]
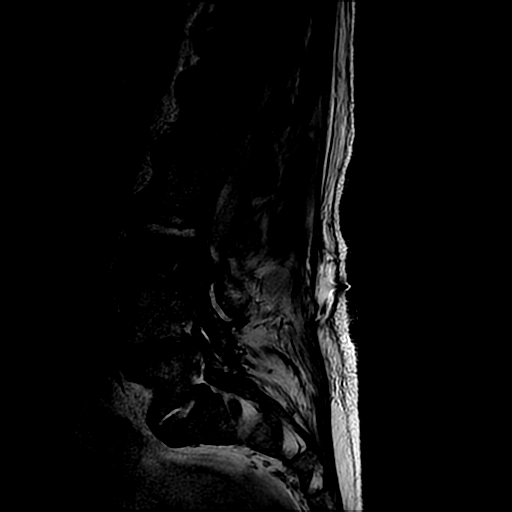
[im 9/14]
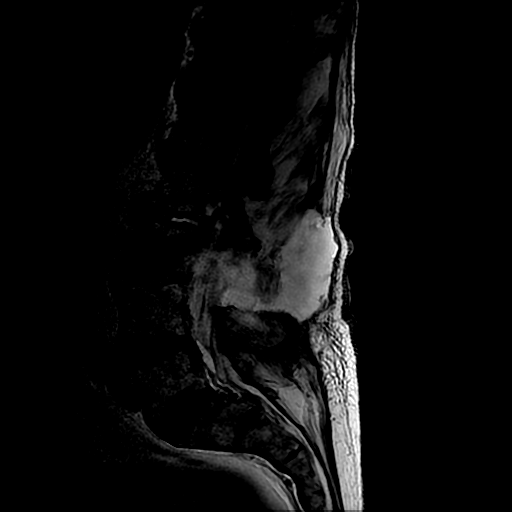
[im 14/14]
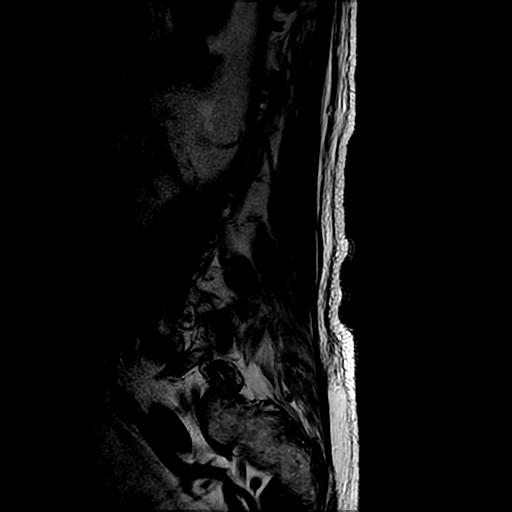

[Series 6: T2 · axial · 4.0mm · 0.39mm/px · z∈[-113,+62]mm · 8 of 36 slices shown (2 of 2)]
[im 1/36]
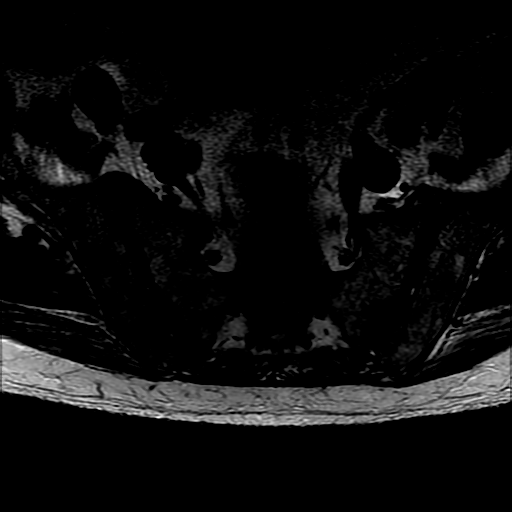
[im 4/36]
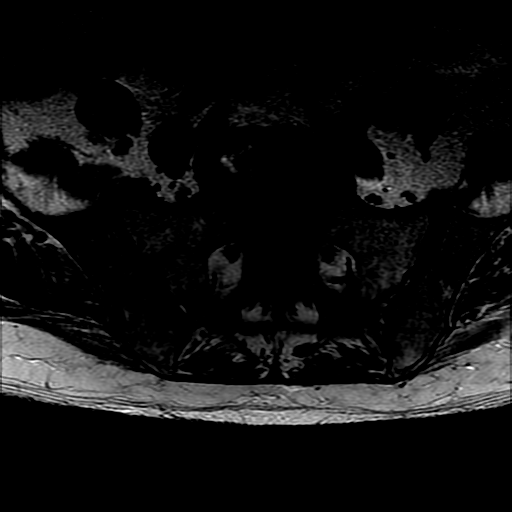
[im 12/36]
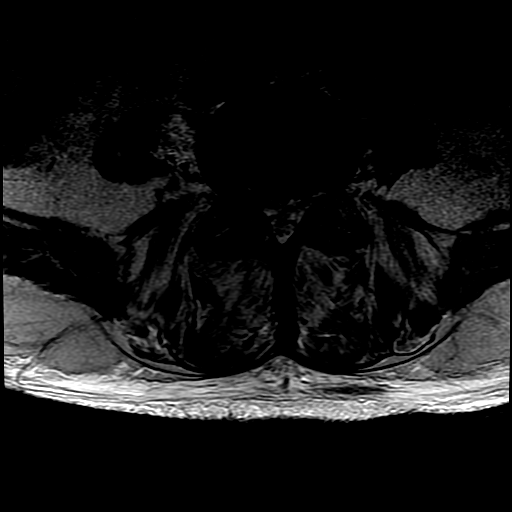
[im 16/36]
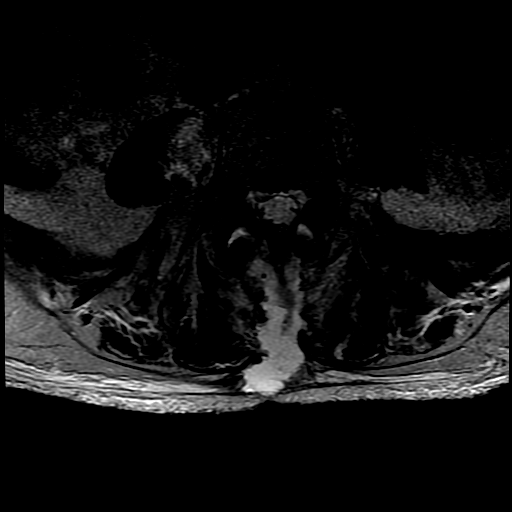
[im 20/36]
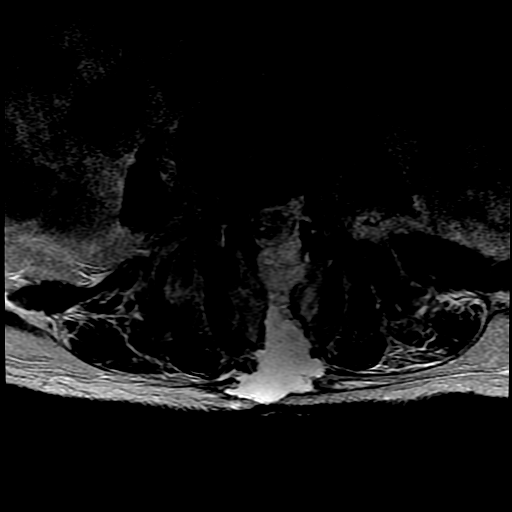
[im 24/36]
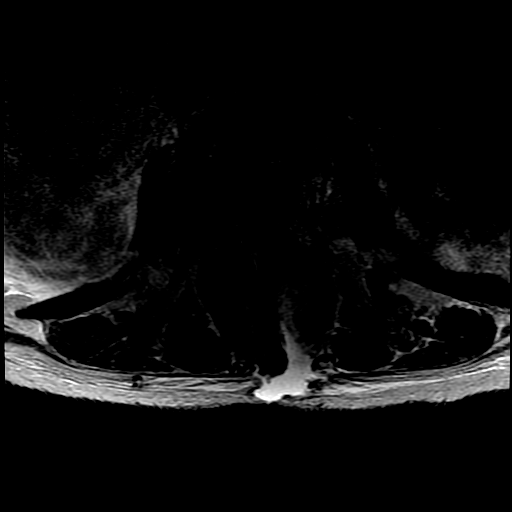
[im 32/36]
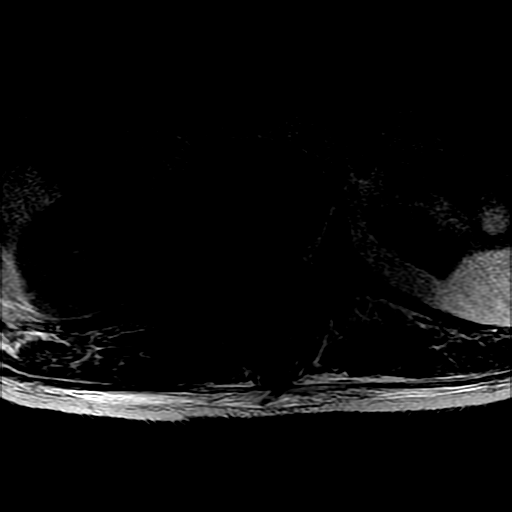
[im 36/36]
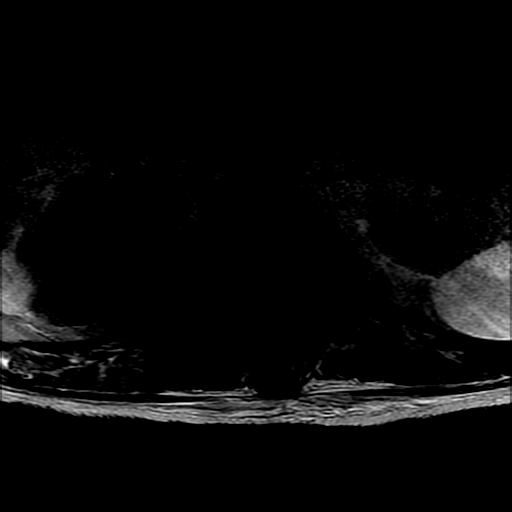

[Series 7: T1 · axial · 4.0mm · 0.82mm/px · z∈[-113,+42]mm · 7 of 36 slices shown (2 of 2)]
[im 1/36]
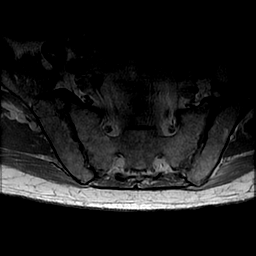
[im 4/36]
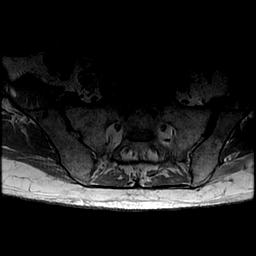
[im 12/36]
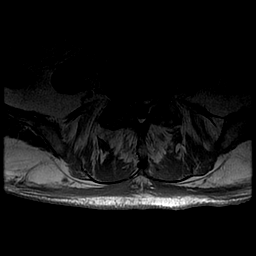
[im 16/36]
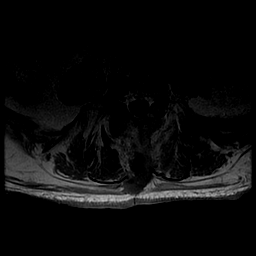
[im 20/36]
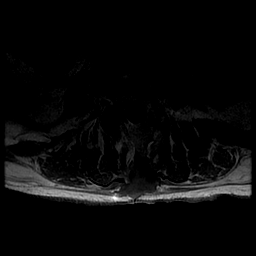
[im 24/36]
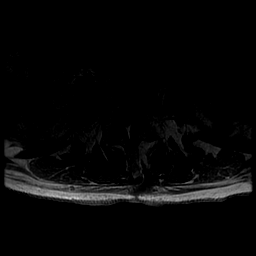
[im 32/36]
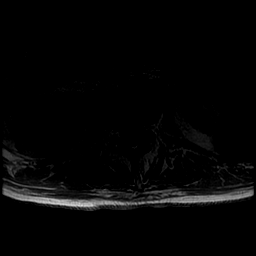

[22 of 48 positions shown; findings below may reference images not displayed]

FINDINGS: Overall image analysis shows image degradation presumably secondary
to the large amount of ascites. Tissue T1 hyperintensity is present
before gadolinium was administered. I do not have a definite
explanation for that. Perhaps there was imaging done elsewhere with
gadolinium administration. The pre and postcontrast axial T1 images
appear essentially the same.

Segmentation:  5 lumbar type vertebral bodies.

Alignment:  Curvature convex to the right with the apex at L3.

Vertebrae: Previous posterior decompression at L3 and the debulking
of previously seen left-sided predominant tumor within the L3
vertebral body. There is residual tumor within the right posterior
vertebral body encroaching upon the right epidural space. This
region of tumor measures about 2.6 cm in diameter. Because of the
posterior decompression, this does not compress the thecal sac
severely. Tumor on the right could affect the right L3 nerve root.
There is edema and enhancement throughout the L3 vertebral body that
is presumed to relate to edema secondary to the tumor and recent
tumor debulking surgery. Osteomyelitis of the L3 vertebral body is
not excluded by imaging but I do not strongly suspect that. Changes
at the L2-3 disc level are most consistent with discogenic change. I
do not see any other foci of tumor within the region imaged. There
is a fairly large amount of fluid along the left side of the thecal
sac and tracking backward along the operative approach at L3. This
could be postoperative hematoma/seroma. The possibility of CSF leak
does exist. This fluid could be sterile or infected. It could be
easily sampled because of its proximity to the skin at the incision
site.

Conus medullaris: Extends to the T12-L1 level and appears normal.

Paraspinal and other soft tissues: See above discussion regarding
fluid collection along the operative approach to the L3
decompression. Differential diagnosis is hematoma seroma versus CSF
leak versus infected fluid.

Disc levels:

T12-L1 and L1-2: Unremarkable.

L2-3: Degenerative disc disease worse on the left the disc space
narrowing and left-sided discogenic changes. Mild stenosis of the
left lateral recess because of osteophytes and bulging of the disc.

L3-4: Disc level itself appears unremarkable. See above for
discussion of residual tumor centrally and to the right at the
posterior L3 vertebral body and fluid along the left side of the
thecal sac and along the operative approach.

L4-5: Bulging of the disc. Mild facet and ligamentous hypertrophy.
Mild stenosis of the lateral recesses without definite neural
compression.

L5-S1: Disc degeneration with endplate osteophytes and bulging of
the disc. Right-sided annular fissures. Bilateral facet
osteoarthritis. Mild bilateral foraminal stenosis.
IMPRESSION: Precontrast T1 tissue hyperintensity of uncertain etiology. See
above discussion.

Recent posterior decompression at L3 with tumor resection on the
left. Residual tumor in the posterior L3 vertebral body centrally
and to the right with extension into the ventral epidural space on
the right which could affect the right-sided nerve roots. Thecal sac
is not severely compressed. No other location of tumor is
identified.

Fluid within the spinal canal along the left side of the thecal sac
at the L3 level, tracking backward along the operative approach to
the skin. This could be hematoma/ seroma, CSF leak or infected
fluid.

Edema and enhancement throughout the L3 vertebral body presumed to
be secondary to a combination of tumor, tumor related edema and
postsurgical edema. The possibility of osteomyelitis is not excluded
but not certain.

## 2018-05-01 IMAGING — US US ABDOMEN COMPLETE
1 series · 13 of 25 positions shown · non-contrast
Comparison: CT scan of July 15, 2016.

CLINICAL DATA: Abdominal pain and distention, hepatic cirrhosis.

EXAM:
ABDOMEN ULTRASOUND COMPLETE

[Series 1: us abdomen complete · 0.28mm/px · 13 of 101 slices shown]
[im 1/101]
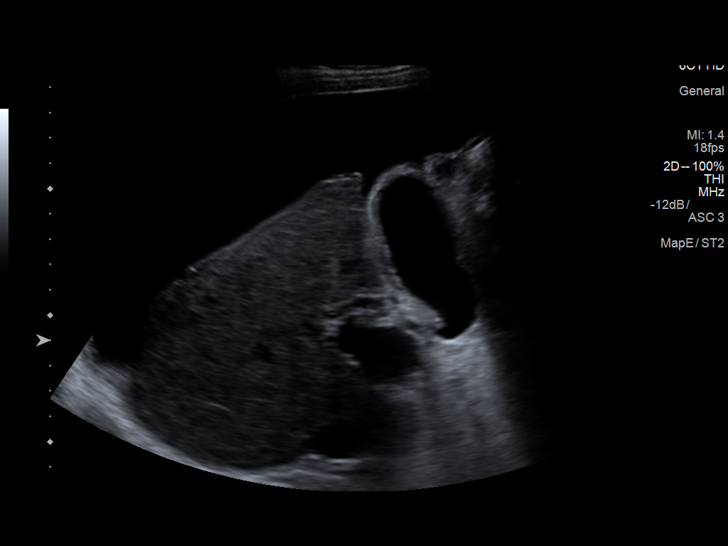
[im 9/101]
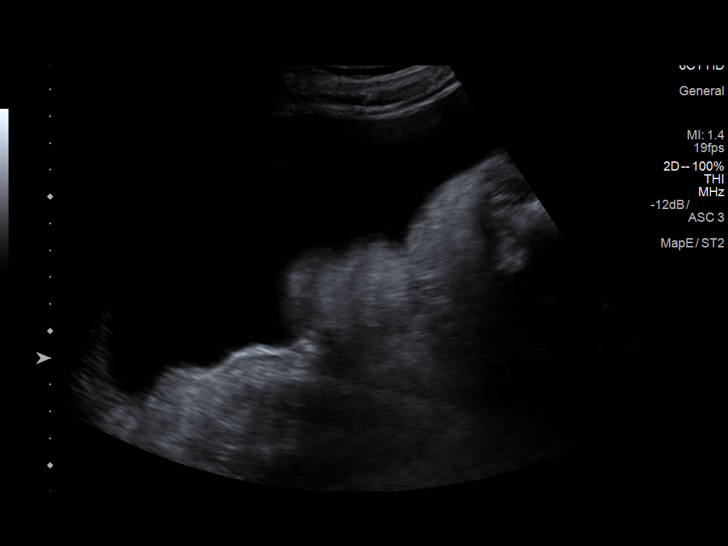
[im 17/101]
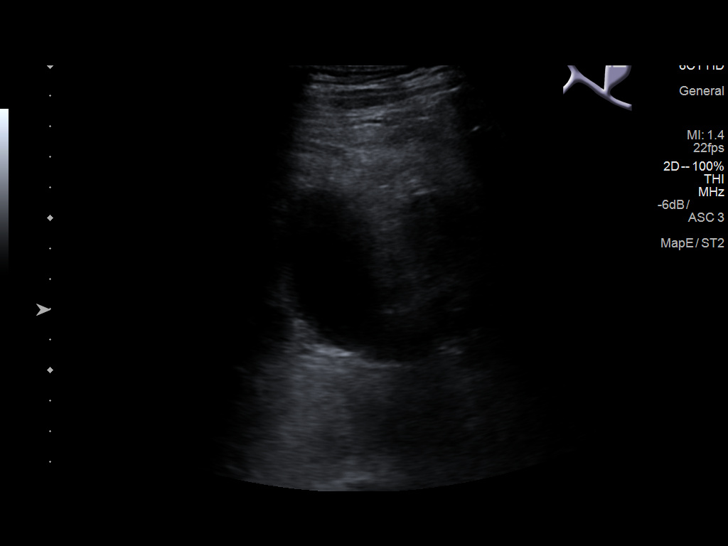
[im 26/101]
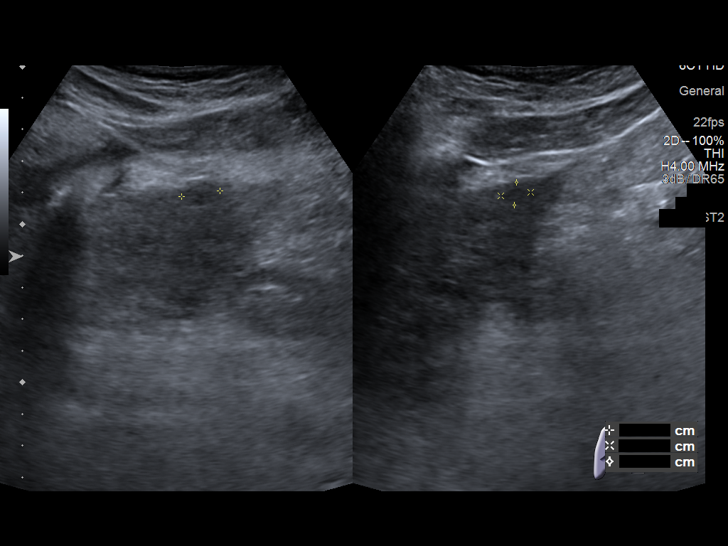
[im 34/101]
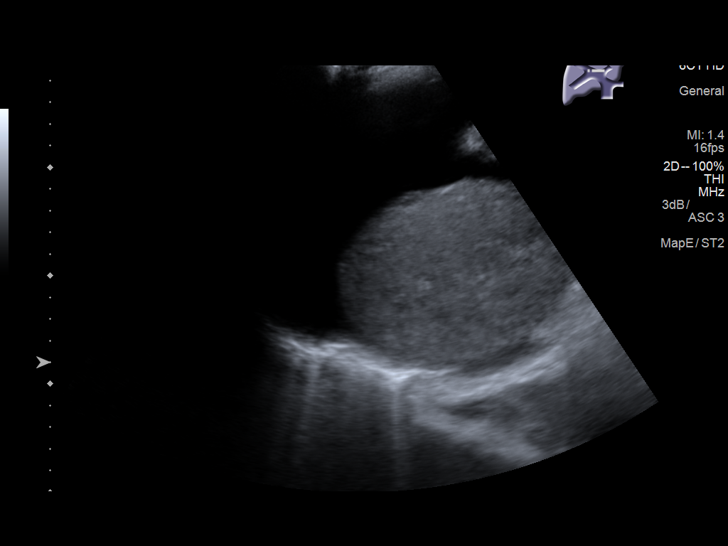
[im 42/101]
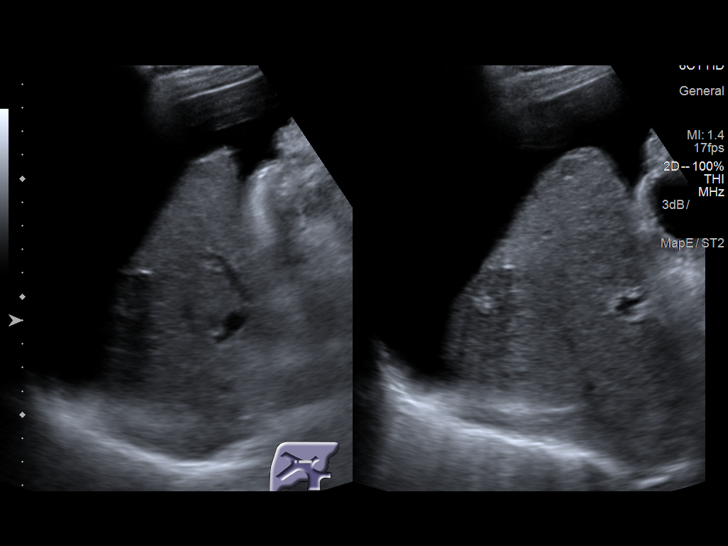
[im 51/101]
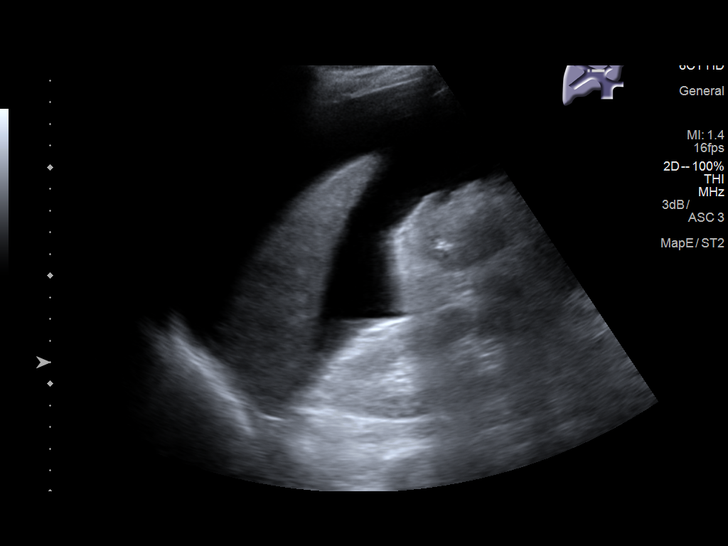
[im 59/101]
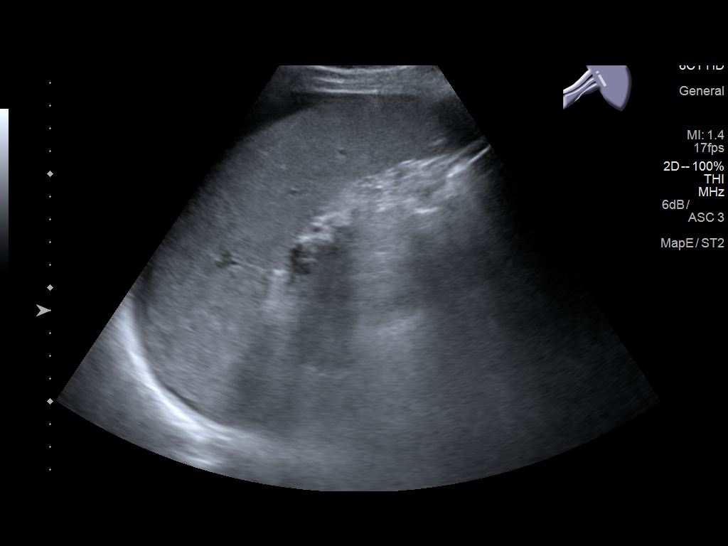
[im 67/101]
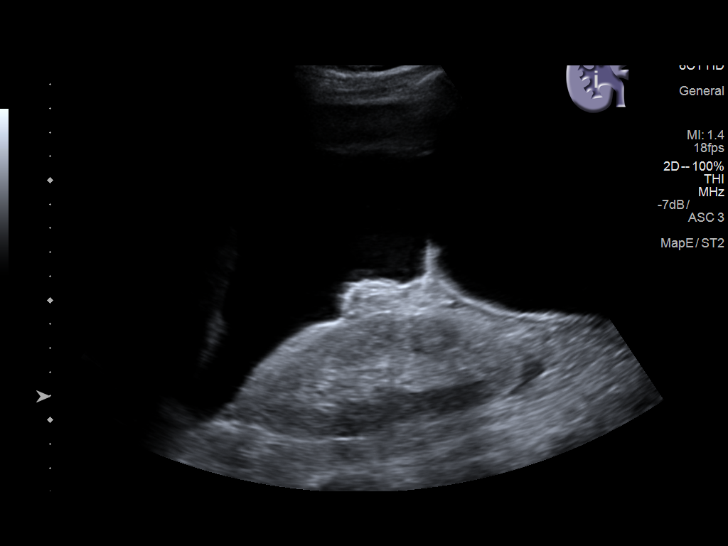
[im 76/101]
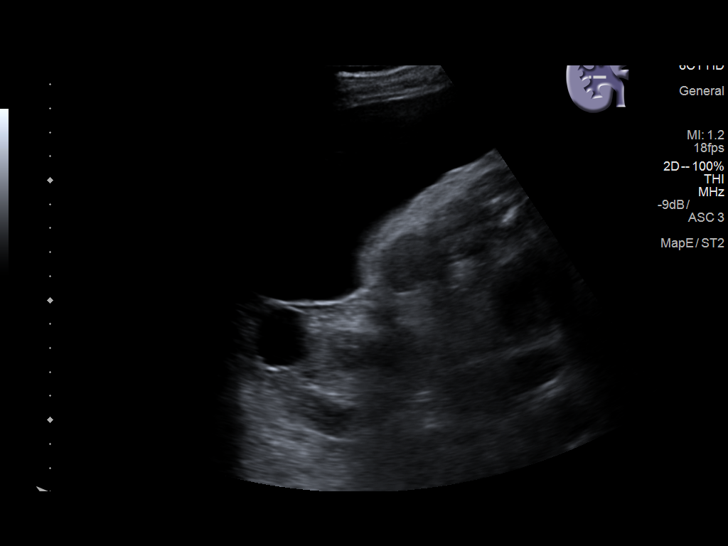
[im 84/101]
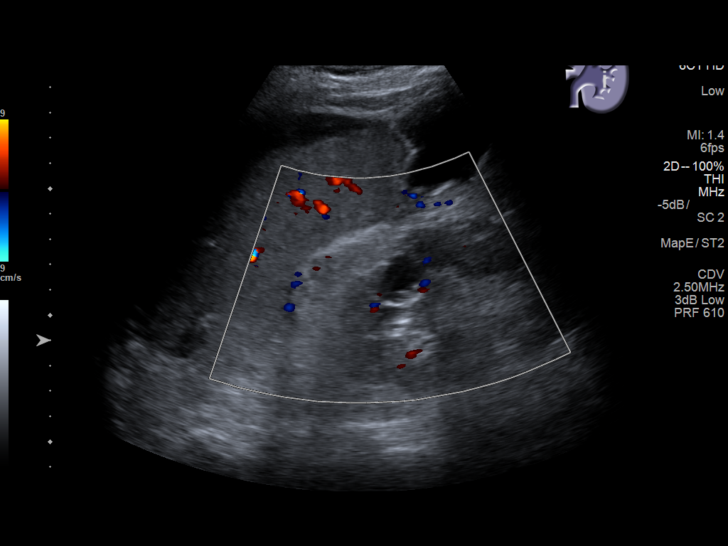
[im 92/101]
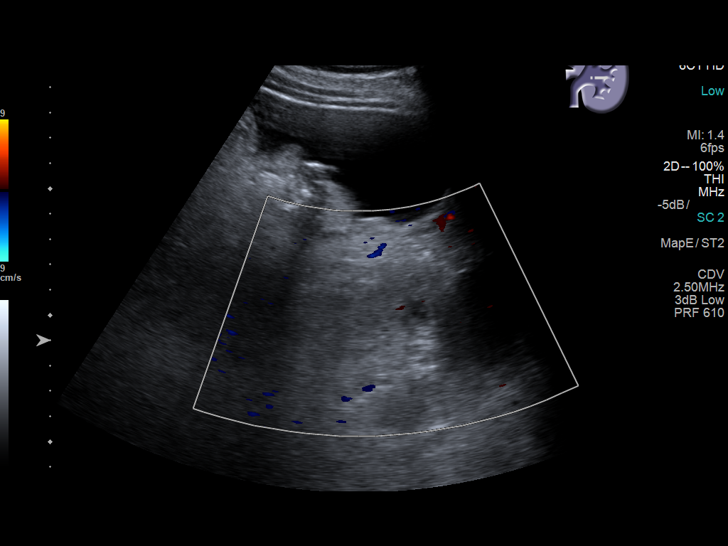
[im 101/101]
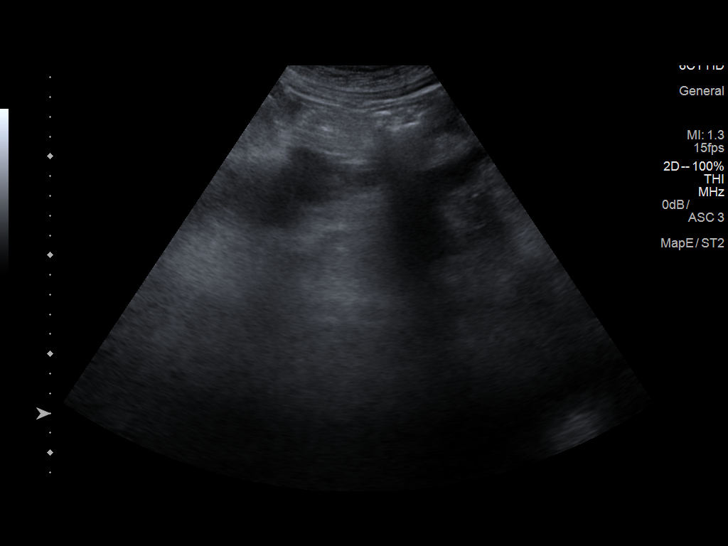

[13 of 25 positions shown; findings below may reference images not displayed]

FINDINGS: Gallbladder: No gallstones visualized. No sonographic Murphy sign
noted by sonographer. Mild gallbladder wall thickening is noted at 4
mm due to surrounding ascites.

Common bile duct: Diameter: 4.1 mm which is within normal limits.

Liver: Heterogeneous echotexture of hepatic parenchyma is noted with
nodular hepatic contours consistent with hepatic cirrhosis. 3.5 x
3.1 x 2.4 cm solid mass is noted in right hepatic lobe which
corresponds to neoplasm to have been previously treated with
ablation.

IVC: No abnormality visualized.

Pancreas: Not well visualized.

Spleen: Maximum measured diameter 12.6 cm with calculated volume of
621 cubic cm consistent with mild splenomegaly.

Right Kidney: Length: 12.8 cm. Multiple simple cysts are noted with
the largest measuring 2.6 cm. Increased echogenicity of renal
parenchyma is noted consistent with medical renal disease. No mass
or hydronephrosis visualized.

Left Kidney: Length: 12.4 cm. Multiple cysts are noted with the
largest measuring 4.7 cm. Increased echogenicity of renal parenchyma
is noted consistent with medical renal disease. No mass or
hydronephrosis visualized.

Abdominal aorta: 3.4 cm abdominal aortic aneurysm.

Other findings: Moderate ascites is noted.
IMPRESSION: Hepatic cirrhosis with mild splenomegaly and associated moderate
ascites. 3.5 cm solid mass is seen in right hepatic lobe which
corresponds to neoplasm which has undergone previous ablation.

3.4 cm abdominal aortic aneurysm.

Increased echogenicity of renal parenchyma is noted bilaterally
suggesting medical renal disease.

## 2018-05-04 IMAGING — US US PARACENTESIS
1 series · 5 of 5 positions shown · non-contrast
Comparison: none

INDICATION: Cirrhosis, hepatitis-C, hepatocellular carcinoma, c diff infection,
ascites; request made for diagnostic and therapeutic paracentesis.

[Series 1: us paracentesis · 0.26mm/px · 5 of 5 slices shown]
[im 1/5]
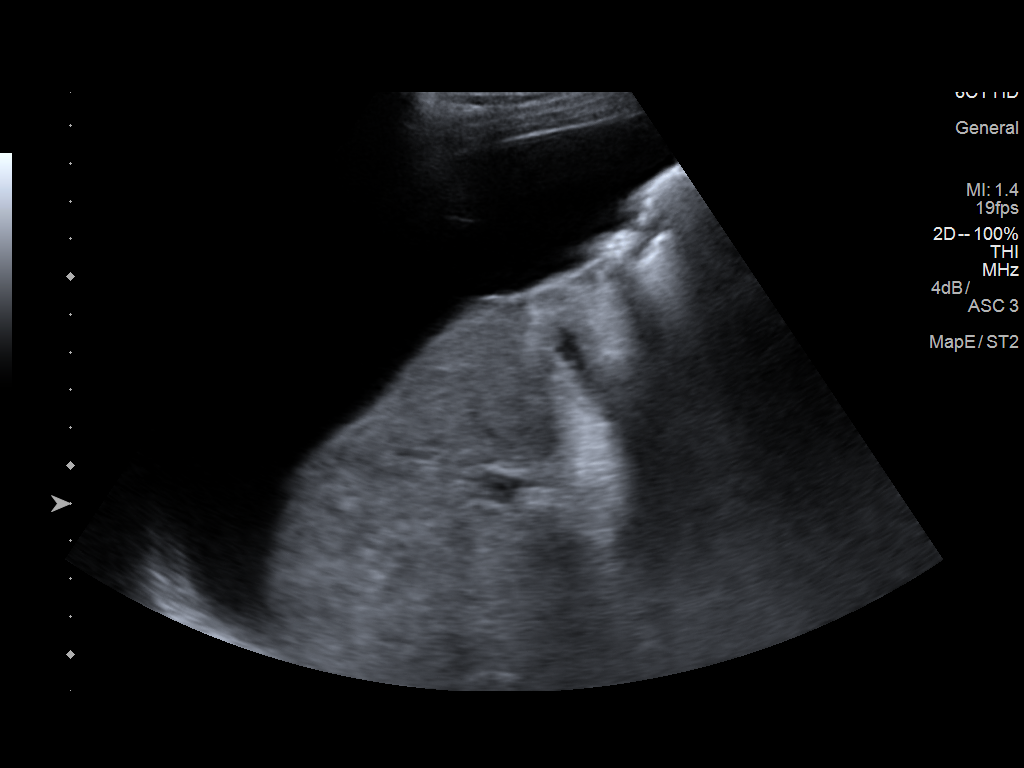
[im 2/5]
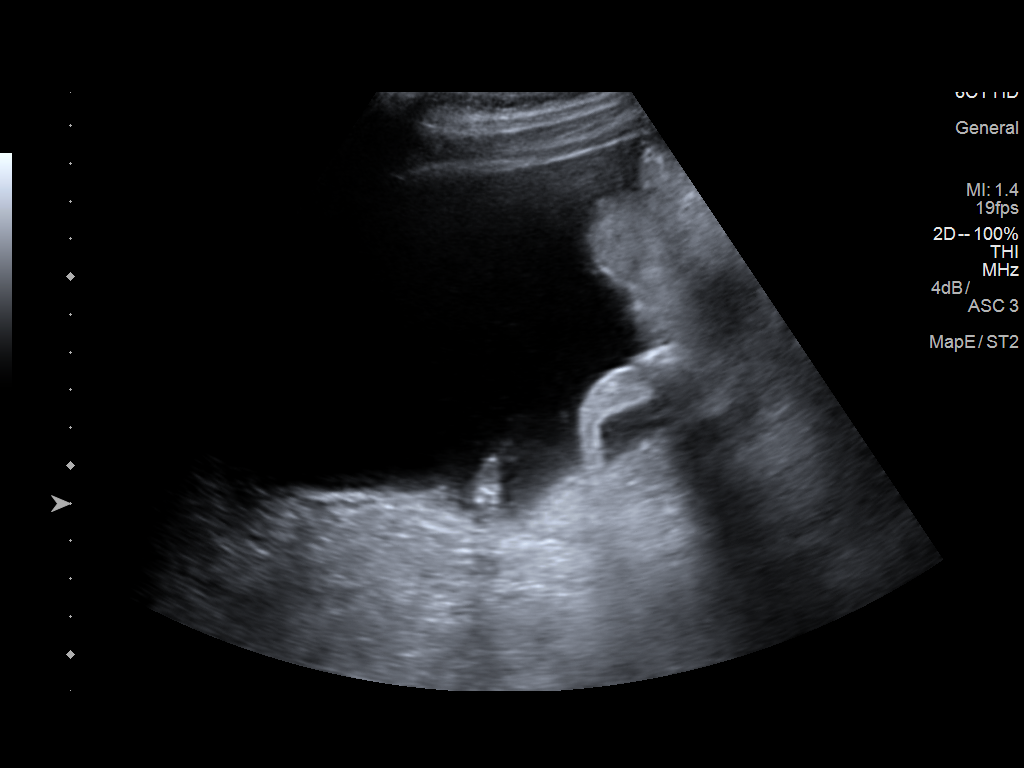
[im 3/5]
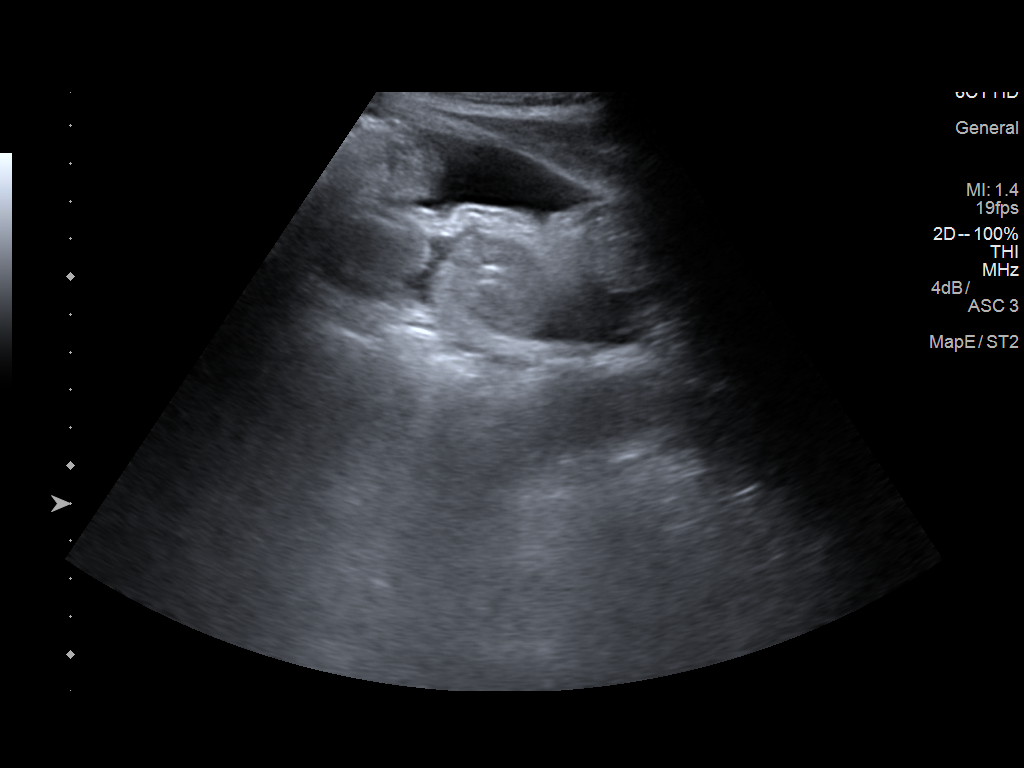
[im 4/5]
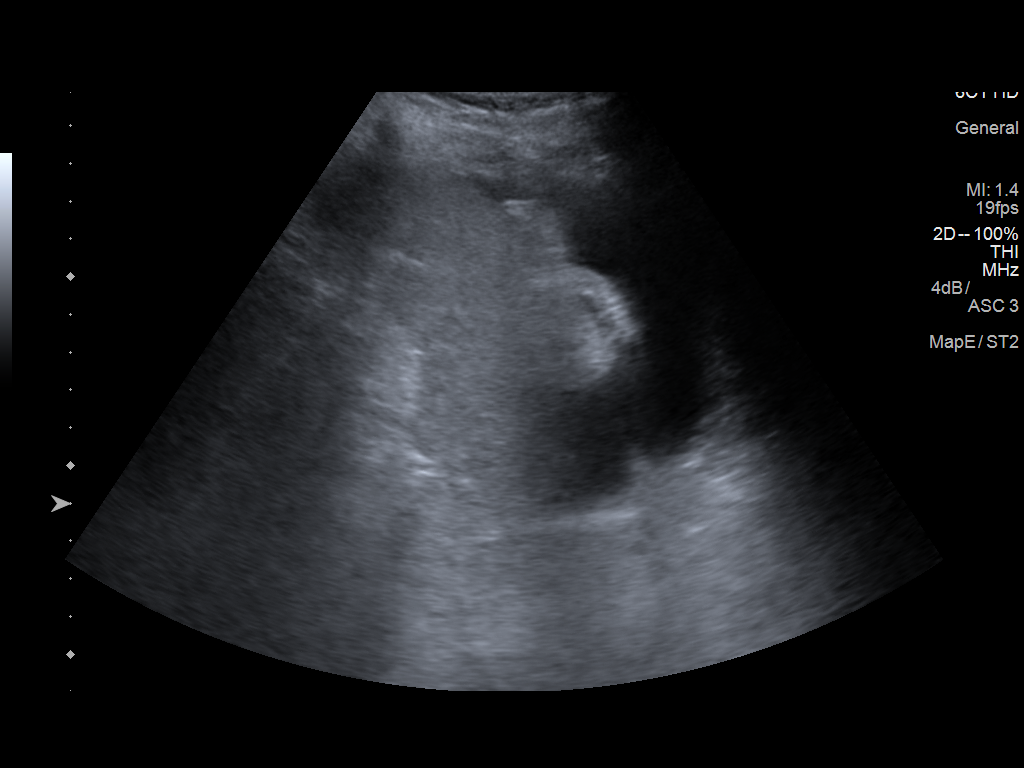
[im 5/5]
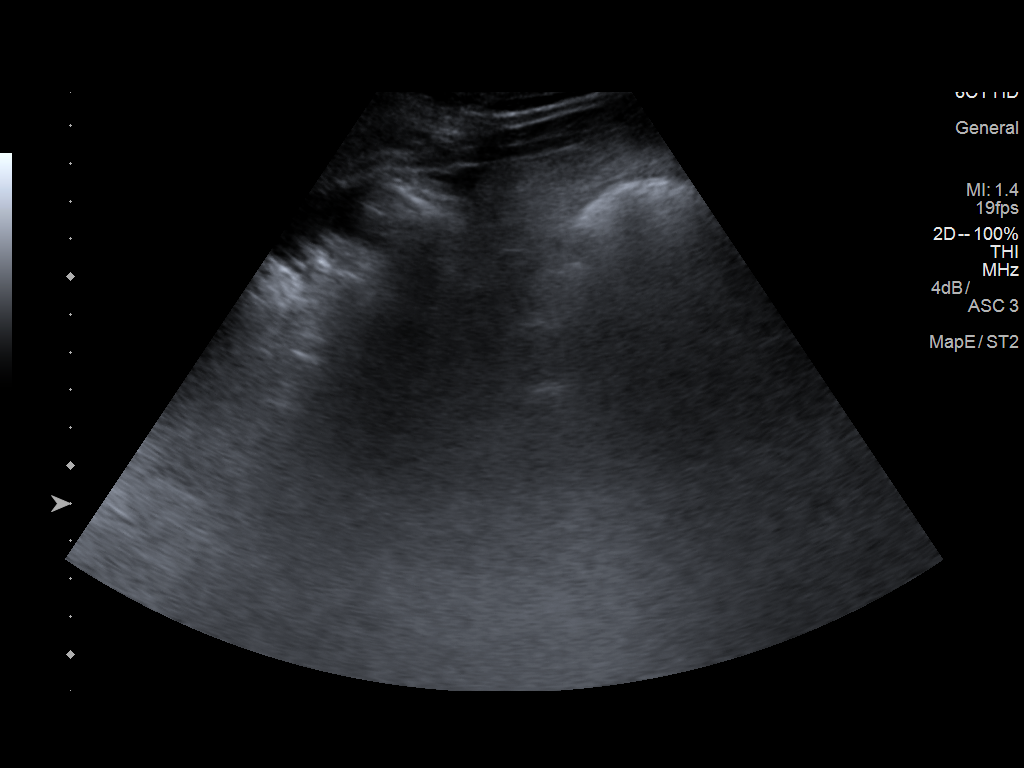

[5 of 5 positions shown; findings below may reference images not displayed]

EXAM:
ULTRASOUND GUIDED DIAGNOSTIC AND THERAPEUTIC PARACENTESIS

MEDICATIONS:
None.

COMPLICATIONS:
None immediate.

PROCEDURE:
Informed written consent was obtained from the patient after a
discussion of the risks, benefits and alternatives to treatment. A
timeout was performed prior to the initiation of the procedure.

Initial ultrasound scanning demonstrates a large amount of ascites
within the right upper to mid abdominal quadrant. The right upper to
mid abdomen was prepped and draped in the usual sterile fashion. 1%
lidocaine was used for local anesthesia.

Following this, a Yueh catheter was introduced. An ultrasound image
was saved for documentation purposes. The paracentesis was
performed. The catheter was removed and a dressing was applied. The
patient tolerated the procedure well without immediate post
procedural complication.
FINDINGS: A total of approximately 3.1 liters of clear, light yellow fluid was
removed. Samples were sent to the laboratory as requested by the
clinical team.
IMPRESSION: Successful ultrasound-guided diagnostic and therapeutic paracentesis
yielding 3.1 liters of peritoneal fluid.
